# Patient Record
Sex: Male | Born: 1940 | Race: White | Hispanic: No | Marital: Single | Smoking: Former smoker
Health system: Southern US, Community
[De-identification: ages and names within clinical notes are randomized; demographics above are authoritative.]

## PROBLEM LIST (undated history)

## (undated) DIAGNOSIS — M1990 Unspecified osteoarthritis, unspecified site: Secondary | ICD-10-CM

## (undated) DIAGNOSIS — E785 Hyperlipidemia, unspecified: Secondary | ICD-10-CM

## (undated) HISTORY — PX: COLONOSCOPY: SHX174

## (undated) HISTORY — DX: Hyperlipidemia, unspecified: E78.5

## (undated) HISTORY — DX: Unspecified osteoarthritis, unspecified site: M19.90

---

## 2008-12-25 ENCOUNTER — Ambulatory Visit: Payer: Self-pay | Admitting: Gastroenterology

## 2016-06-08 ENCOUNTER — Ambulatory Visit
Admission: RE | Admit: 2016-06-08 | Discharge: 2016-06-08 | Disposition: A | Discharge status: Home / Self Care | Payer: BLUE CROSS/BLUE SHIELD | Source: Ambulatory Visit | Attending: Family Medicine | Admitting: Family Medicine

## 2016-06-08 ENCOUNTER — Other Ambulatory Visit: Payer: Self-pay | Admitting: Family Medicine

## 2016-06-08 DIAGNOSIS — R600 Localized edema: Secondary | ICD-10-CM | POA: Insufficient documentation

## 2017-03-17 ENCOUNTER — Ambulatory Visit (INDEPENDENT_AMBULATORY_CARE_PROVIDER_SITE_OTHER): Payer: BLUE CROSS/BLUE SHIELD | Admitting: Vascular Surgery

## 2017-03-17 ENCOUNTER — Encounter (INDEPENDENT_AMBULATORY_CARE_PROVIDER_SITE_OTHER): Payer: Self-pay | Admitting: Vascular Surgery

## 2017-03-17 VITALS — BP 122/72 | HR 76 | Resp 16 | Ht 64.0 in | Wt 165.4 lb

## 2017-03-17 DIAGNOSIS — M79604 Pain in right leg: Secondary | ICD-10-CM | POA: Insufficient documentation

## 2017-03-17 DIAGNOSIS — E785 Hyperlipidemia, unspecified: Secondary | ICD-10-CM | POA: Diagnosis not present

## 2017-03-17 DIAGNOSIS — M79605 Pain in left leg: Secondary | ICD-10-CM | POA: Diagnosis not present

## 2017-03-17 DIAGNOSIS — R600 Localized edema: Secondary | ICD-10-CM | POA: Diagnosis not present

## 2017-03-17 NOTE — Progress Notes (Signed)
Subjective:    Patient ID: Derek Lynch, male    DOB: 12-20-1940, 77 y.o.   MRN: [id number] Chief Complaint  Patient presents with  . New Patient (Initial Visit)    ref Rosalie Gums chronic ven insuffiency   Presents as a new patient referred by nurse practitioner Rosalie Gums for evaluation of lower extremity edema.  The patient endorses a 46-month history of edema to the lower legs.  The patient notes that they have since improved after his most recent visit with his primary care physician.  The patient notes that the Lasix helps.  The patient does engage in conservative therapy including medical grade 1 compression stockings and elevation of his lower extremity.  The patient has not worked in over a year due to a "foot infection" being treated by Dr. Alberteen Spindle.  The patient notes improvement to his left foot infection.  Patient is currently wearing bilateral diabetic shoes.  The patient notes his swelling is worse with activity or sitting or standing for long periods of time.  The patient does note discomfort to the bilateral feet with ambulation.  The patient denies any rest pain or ulceration to the bilateral lower extremity the patient denies any recent trauma surgery to the bilateral lower extremity the patient denies any DVT history to the bilateral lower extremity.  The patient denies any fever, nausea vomiting.   Review of Systems  Constitutional: Negative.   HENT: Negative.   Eyes: Negative.   Respiratory: Negative.   Cardiovascular: Positive for leg swelling.  Gastrointestinal: Negative.   Endocrine: Negative.   Genitourinary: Negative.   Musculoskeletal: Negative.   Skin: Negative.   Allergic/Immunologic: Negative.   Neurological: Negative.   Hematological: Negative.   Psychiatric/Behavioral: Negative.       Objective:   Physical Exam  Constitutional: He is oriented to person, place, and time. He appears well-developed and well-nourished. No distress.  HENT:  Head: Normocephalic and  atraumatic.  Eyes: Conjunctivae are normal.  Neck: Normal range of motion.  Cardiovascular: Normal rate, regular rhythm, normal heart sounds and intact distal pulses.  Pulses:      Radial pulses are 2+ on the right side, and 2+ on the left side.       Dorsalis pedis pulses are 1+ on the right side, and 1+ on the left side.       Posterior tibial pulses are 1+ on the right side, and 1+ on the left side.  Pulmonary/Chest: Effort normal and breath sounds normal.  Musculoskeletal: Normal range of motion. He exhibits edema (L to moderate nonpitting edema noted bilaterally).  Neurological: He is alert and oriented to person, place, and time.  Skin: He is not diaphoretic.  Minimal less than 1 cm varicosities noted to the bilateral lower extremity.  Moderate stasis dermatitis noted to the bilateral lower extremity.  There are no signs of active cellulitis or skin thickening noted to the bilateral lower extremity.  Psychiatric: He has a normal mood and affect. His behavior is normal. Judgment and thought content normal.  Vitals reviewed.  BP 122/72 (BP Location: Right Arm)   Pulse 76   Resp 16   Ht 5\' 4"  (1.626 m)   Wt 165 lb 6.4 oz (75 kg)   BMI 28.39 kg/m   Past Medical History:  Diagnosis Date  . Arthritis   . Hyperlipidemia    Social History   Socioeconomic History  . Marital status: Single    Spouse name: Not on file  . Number of children:  Not on file  . Years of education: Not on file  . Highest education level: Not on file  Social Needs  . Financial resource strain: Not on file  . Food insecurity - worry: Not on file  . Food insecurity - inability: Not on file  . Transportation needs - medical: Not on file  . Transportation needs - non-medical: Not on file  Occupational History  . Not on file  Tobacco Use  . Smoking status: Former Games developer  . Smokeless tobacco: Never Used  Substance and Sexual Activity  . Alcohol use: No    Frequency: Never  . Drug use: No  . Sexual  activity: Not on file  Other Topics Concern  . Not on file  Social History Narrative  . Not on file   Past Surgical History:  Procedure Laterality Date  . COLONOSCOPY     Family History  Problem Relation Age of Onset  . Heart attack Mother   . Stroke Father   . Pancreatic cancer Father    Allergies  Allergen Reactions  . Penicillin G Other (See Comments)      Assessment & Plan:  Presents as a new patient referred by nurse practitioner Rosalie Gums for evaluation of lower extremity edema.  The patient endorses a 43-month history of edema to the lower legs.  The patient notes that they have since improved after his most recent visit with his primary care physician.  The patient notes that the Lasix helps.  The patient does engage in conservative therapy including medical grade 1 compression stockings and elevation of his lower extremity.  The patient has not worked in over a year due to a "foot infection" being treated by Dr. Alberteen Spindle.  The patient notes improvement to his left foot infection.  Patient is currently wearing bilateral diabetic shoes.  The patient notes his swelling is worse with activity or sitting or standing for long periods of time.  The patient does note discomfort to the bilateral feet with ambulation.  The patient denies any rest pain or ulceration to the bilateral lower extremity the patient denies any recent trauma surgery to the bilateral lower extremity the patient denies any DVT history to the bilateral lower extremity.  The patient denies any fever, nausea vomiting.  1. Bilateral lower extremity edema - New The patient was encouraged to continue wearing graduated compression stockings (20-30 mmHg) on a daily basis. The patient was instructed to begin wearing the stockings first thing in the morning and removing them in the evening. The patient was instructed specifically not to sleep in the stockings. Prescription In addition, behavioral modification including elevation during  the day will be initiated. Anti-inflammatories for pain. The patient will follow up in one month to asses conservative management.  Information on chronic venous insufficiency and compression stockings was given to the patient. The patient was instructed to call the office in the interim if any worsening edema or ulcerations to the legs, feet or toes occurs. The patient expresses their understanding.  - VAS [place] LOWER EXTREMITY VENOUS REFLUX; Future  2. Pain in both lower extremities - New Patient with risk factors for peripheral artery disease Patient with a healing infection to the left foot Pain to the feet with ambulation Will order an ABI to rule out any contributing peripheral artery disease  - VAS [place] ABI WITH/WO TBI; Future  3. Hyperlipidemia, unspecified hyperlipidemia type - Stable Encouraged good control as its slows the progression of atherosclerotic disease  Current Outpatient Medications on  File Prior to Visit  Medication Sig Dispense Refill  . Ascorbic Acid (VITAMIN C) 1000 MG tablet Take 1,000 mg by mouth daily.    Marland Kitchen. aspirin EC 81 MG tablet Take by mouth.    . cholecalciferol (VITAMIN D) 1000 units tablet Take 1,000 Units by mouth daily.    . furosemide (LASIX) 20 MG tablet Take by mouth.    . lovastatin (MEVACOR) 20 MG tablet Take by mouth.     No current facility-administered medications on file prior to visit.    There are no Patient Instructions on file for this visit. No Follow-up on file.  Diamantina Edinger A Cayden Rautio, PA-C

## 2017-04-14 ENCOUNTER — Ambulatory Visit (INDEPENDENT_AMBULATORY_CARE_PROVIDER_SITE_OTHER): Payer: BLUE CROSS/BLUE SHIELD | Admitting: Vascular Surgery

## 2017-04-14 ENCOUNTER — Encounter (INDEPENDENT_AMBULATORY_CARE_PROVIDER_SITE_OTHER): Payer: Self-pay | Admitting: Vascular Surgery

## 2017-04-14 ENCOUNTER — Ambulatory Visit (INDEPENDENT_AMBULATORY_CARE_PROVIDER_SITE_OTHER): Payer: BLUE CROSS/BLUE SHIELD

## 2017-04-14 VITALS — BP 131/72 | HR 73 | Resp 14 | Ht 64.0 in | Wt 175.0 lb

## 2017-04-14 DIAGNOSIS — M79604 Pain in right leg: Secondary | ICD-10-CM | POA: Diagnosis not present

## 2017-04-14 DIAGNOSIS — R600 Localized edema: Secondary | ICD-10-CM

## 2017-04-14 DIAGNOSIS — I872 Venous insufficiency (chronic) (peripheral): Secondary | ICD-10-CM

## 2017-04-14 DIAGNOSIS — M79605 Pain in left leg: Secondary | ICD-10-CM | POA: Diagnosis not present

## 2017-04-14 DIAGNOSIS — I890 Lymphedema, not elsewhere classified: Secondary | ICD-10-CM | POA: Diagnosis not present

## 2017-04-14 NOTE — Progress Notes (Signed)
Subjective:    Patient ID: Derek Lynch, male    DOB: April 02, 1940, 77 y.o.   MRN: [id number] Chief Complaint  Patient presents with  . Follow-up    16month abi,bil le ven reflux   Patient presents to review vascular studies.  The patient was last seen on March 17, 2017 for lower extremity edema and discomfort.  Since his initial visit, the patient has been engaging in conservative therapy including wearing medical grade 1 compression socks and elevating his legs with some improvement to his symptoms.  The patient continues to wear orthotic shoes.  The patient does note an improvement to his edema.  The patient underwent a bilateral lower extremity ABI which was notable for bilateral triphasic tibials left ABI 1.16 right ABI noncompressible.  No evidence of significant lower extremity arterial disease bilaterally.  The patient underwent a bilateral venous duplex which was notable for abnormal reflux times in the right common femoral vein, femoral vein, popliteal vein and great saphenous vein at the groin.  Abnormal reflux times were noted in the left femoral vein.  There is no evidence of deep vein or superficial thrombophlebitis to the bilateral lower extremity.  The patient denies any worsening symptoms.  The patient denies any fever, nausea vomiting.  Review of Systems  Constitutional: Negative.   HENT: Negative.   Eyes: Negative.   Respiratory: Negative.   Cardiovascular: Positive for leg swelling.  Gastrointestinal: Negative.   Endocrine: Negative.   Genitourinary: Negative.   Musculoskeletal: Negative.   Skin: Negative.   Allergic/Immunologic: Negative.   Neurological: Negative.   Hematological: Negative.   Psychiatric/Behavioral: Negative.       Objective:   Physical Exam  Constitutional: He is oriented to person, place, and time. He appears well-developed and well-nourished. No distress.  HENT:  Head: Normocephalic and atraumatic.  Eyes: Pupils are equal, round, and  reactive to light. Conjunctivae are normal.  Neck: Normal range of motion.  Cardiovascular: Normal rate, regular rhythm, normal heart sounds and intact distal pulses.  Pulses:      Radial pulses are 2+ on the right side, and 2+ on the left side.       Dorsalis pedis pulses are 1+ on the right side, and 1+ on the left side.       Posterior tibial pulses are 1+ on the right side, and 1+ on the left side.  Pulmonary/Chest: Effort normal and breath sounds normal.  Musculoskeletal: Normal range of motion. He exhibits edema (Mild nonpitting edema noted bilaterally).  Neurological: He is alert and oriented to person, place, and time.  Skin: He is not diaphoretic.  Minimal less than 1 cm varicosities noted to the bilateral lower extremity.  Moderate stasis dermatitis noted to the bilateral lower extremity.  There are no signs of active cellulitis or skin thickening noted to the bilateral lower extremity  Psychiatric: He has a normal mood and affect. His behavior is normal. Judgment and thought content normal.  Vitals reviewed.  BP 131/72 (BP Location: Left Arm)   Pulse 73   Resp 14   Ht 5\' 4"  (1.626 m)   Wt 175 lb (79.4 kg)   BMI 30.04 kg/m   Past Medical History:  Diagnosis Date  . Arthritis   . Hyperlipidemia    Social History   Socioeconomic History  . Marital status: Single    Spouse name: Not on file  . Number of children: Not on file  . Years of education: Not on file  . Highest  education level: Not on file  Occupational History  . Not on file  Social Needs  . Financial resource strain: Not on file  . Food insecurity:    Worry: Not on file    Inability: Not on file  . Transportation needs:    Medical: Not on file    Non-medical: Not on file  Tobacco Use  . Smoking status: Former Games developermoker  . Smokeless tobacco: Never Used  Substance and Sexual Activity  . Alcohol use: No    Frequency: Never  . Drug use: No  . Sexual activity: Not on file  Lifestyle  . Physical  activity:    Days per week: Not on file    Minutes per session: Not on file  . Stress: Not on file  Relationships  . Social connections:    Talks on phone: Not on file    Gets together: Not on file    Attends religious service: Not on file    Active member of club or organization: Not on file    Attends meetings of clubs or organizations: Not on file    Relationship status: Not on file  . Intimate partner violence:    Fear of current or ex partner: Not on file    Emotionally abused: Not on file    Physically abused: Not on file    Forced sexual activity: Not on file  Other Topics Concern  . Not on file  Social History Narrative  . Not on file   Past Surgical History:  Procedure Laterality Date  . COLONOSCOPY     Family History  Problem Relation Age of Onset  . Heart attack Mother   . Stroke Father   . Pancreatic cancer Father    Allergies  Allergen Reactions  . Penicillin G Other (See Comments)      Assessment & Plan:  Patient presents to review vascular studies.  The patient was last seen on March 17, 2017 for lower extremity edema and discomfort.  Since his initial visit, the patient has been engaging in conservative therapy including wearing medical grade 1 compression socks and elevating his legs with some improvement to his symptoms.  The patient continues to wear orthotic shoes.  The patient does note an improvement to his edema.  The patient underwent a bilateral lower extremity ABI which was notable for bilateral triphasic tibials left ABI 1.16 right ABI noncompressible.  No evidence of significant lower extremity arterial disease bilaterally.  The patient underwent a bilateral venous duplex which was notable for abnormal reflux times in the right common femoral vein, femoral vein, popliteal vein and great saphenous vein at the groin.  Abnormal reflux times were noted in the left femoral vein.  There is no evidence of deep vein or superficial thrombophlebitis to the  bilateral lower extremity.  The patient denies any worsening symptoms.  The patient denies any fever, nausea vomiting.  1. Chronic venous insufficiency - New Patient with normal ABI today.  Patient with reflux to the deep venous system bilaterally.  Due to the anatomical location he is not a candidate for endovenous laser ablation We discussed the addition of a lymphedema pump.  At this time, the patient is not interested in moving forward with this additional therapy. The patient should continue engaging in conservative therapy including wearing medical grade 1 compression stockings and elevating his legs I will bring the patient back in 2 months to assess his progress. The patient can call the office at any time and  we will be happy to apply for a lymphedema pump  2. Lymphedema - Stable As above  Current Outpatient Medications on File Prior to Visit  Medication Sig Dispense Refill  . Ascorbic Acid (VITAMIN C) 1000 MG tablet Take 1,000 mg by mouth daily.    Marland Kitchen aspirin EC 81 MG tablet Take by mouth.    . cholecalciferol (VITAMIN D) 1000 units tablet Take 1,000 Units by mouth daily.    . furosemide (LASIX) 20 MG tablet Take by mouth.    . lovastatin (MEVACOR) 20 MG tablet Take by mouth.     No current facility-administered medications on file prior to visit.     There are no Patient Instructions on file for this visit. No follow-ups on file.   Deegan Valentino A Oluwatobi Visser, PA-C

## 2017-04-19 ENCOUNTER — Telehealth (INDEPENDENT_AMBULATORY_CARE_PROVIDER_SITE_OTHER): Payer: Self-pay

## 2017-04-19 NOTE — Telephone Encounter (Signed)
I spoke with Valentina GuLucy and she verbalized that Allysa needed patient last note from our office and I inform her that I had faxed that note on 04/15/17 and she confirmed that the note was received

## 2017-04-19 NOTE — Telephone Encounter (Signed)
Alissa called from [employer] wanting a copy of the office visit note from the patient's visit on 04/17/17 so that his disability can be extended.  Please call [phone] Stormy Fabianlissa would like to confirm some other information as well.

## 2017-07-15 ENCOUNTER — Ambulatory Visit (INDEPENDENT_AMBULATORY_CARE_PROVIDER_SITE_OTHER): Payer: Self-pay | Admitting: Vascular Surgery

## 2017-07-21 ENCOUNTER — Encounter (INDEPENDENT_AMBULATORY_CARE_PROVIDER_SITE_OTHER): Payer: Self-pay | Admitting: Vascular Surgery

## 2017-07-21 ENCOUNTER — Ambulatory Visit (INDEPENDENT_AMBULATORY_CARE_PROVIDER_SITE_OTHER): Payer: Medicare Other | Admitting: Vascular Surgery

## 2017-07-21 VITALS — BP 128/71 | HR 71 | Resp 13 | Ht 64.0 in | Wt 184.0 lb

## 2017-07-21 DIAGNOSIS — I872 Venous insufficiency (chronic) (peripheral): Secondary | ICD-10-CM

## 2017-07-21 DIAGNOSIS — I890 Lymphedema, not elsewhere classified: Secondary | ICD-10-CM | POA: Diagnosis not present

## 2017-07-21 NOTE — Progress Notes (Signed)
Subjective:    Patient ID: Derek Lynch, male    DOB: Feb 20, 1940, 77 y.o.   MRN: [id number] Chief Complaint  Patient presents with  . Follow-up    3 month lymphedema check   Patient presents for a 2244-month bilateral lower extremity chronic venous insufficiency/lymphedema follow-up.  The patient notes that he still experiences some mild swelling to the bilateral lower extremity however he has not been engaging conservative therapy including wearing medical grade 1 compression socks or elevating his legs on a daily basis.  The patient notes that the edema is associated with mild discomfort and this is not lifestyle limiting at this time.  The patient denies any claudication-like symptoms, rest pain or ulceration to the bilateral lower extremity.  Denies any fever, nausea vomiting.  Review of Systems  Constitutional: Negative.   HENT: Negative.   Eyes: Negative.   Respiratory: Negative.   Cardiovascular: Positive for leg swelling.       Chronic venous insufficiency Lymphedema  Gastrointestinal: Negative.   Endocrine: Negative.   Genitourinary: Negative.   Musculoskeletal: Negative.   Skin: Negative.   Allergic/Immunologic: Negative.   Neurological: Negative.   Hematological: Negative.   Psychiatric/Behavioral: Negative.       Objective:   Physical Exam  Constitutional: He is oriented to person, place, and time. He appears well-developed and well-nourished. No distress.  HENT:  Head: Normocephalic and atraumatic.  Right Ear: External ear normal.  Left Ear: External ear normal.  Eyes: Pupils are equal, round, and reactive to light. Conjunctivae and EOM are normal.  Neck: Normal range of motion.  Cardiovascular: Normal rate, regular rhythm and normal heart sounds.  Pulmonary/Chest: Effort normal and breath sounds normal.  Musculoskeletal: Normal range of motion. He exhibits edema (Mild nonpitting edema noted bilaterally).  Neurological: He is alert and oriented to person, place,  and time.  Skin: Skin is warm and dry. He is not diaphoretic.  No active cellulitis or ulcerations at this time  Psychiatric: He has a normal mood and affect. His behavior is normal. Judgment and thought content normal.  Vitals reviewed.  BP 128/71 (BP Location: Left Arm, Patient Position: Sitting)   Pulse 71   Resp 13   Ht 5\' 4"  (1.626 m)   Wt 184 lb (83.5 kg)   BMI 31.58 kg/m   Past Medical History:  Diagnosis Date  . Arthritis   . Hyperlipidemia    Social History   Socioeconomic History  . Marital status: Single    Spouse name: Not on file  . Number of children: Not on file  . Years of education: Not on file  . Highest education level: Not on file  Occupational History  . Not on file  Social Needs  . Financial resource strain: Not on file  . Food insecurity:    Worry: Not on file    Inability: Not on file  . Transportation needs:    Medical: Not on file    Non-medical: Not on file  Tobacco Use  . Smoking status: Former Games developermoker  . Smokeless tobacco: Never Used  Substance and Sexual Activity  . Alcohol use: No    Frequency: Never  . Drug use: No  . Sexual activity: Not on file  Lifestyle  . Physical activity:    Days per week: Not on file    Minutes per session: Not on file  . Stress: Not on file  Relationships  . Social connections:    Talks on phone: Not on file  Gets together: Not on file    Attends religious service: Not on file    Active member of club or organization: Not on file    Attends meetings of clubs or organizations: Not on file    Relationship status: Not on file  . Intimate partner violence:    Fear of current or ex partner: Not on file    Emotionally abused: Not on file    Physically abused: Not on file    Forced sexual activity: Not on file  Other Topics Concern  . Not on file  Social History Narrative  . Not on file   Past Surgical History:  Procedure Laterality Date  . COLONOSCOPY     Family History  Problem Relation Age  of Onset  . Heart attack Mother   . Stroke Father   . Pancreatic cancer Father    Allergies  Allergen Reactions  . Penicillin G Other (See Comments)      Assessment & Plan:  Patient presents for a 28-month bilateral lower extremity chronic venous insufficiency/lymphedema follow-up.  The patient notes that he still experiences some mild swelling to the bilateral lower extremity however he has not been engaging conservative therapy including wearing medical grade 1 compression socks or elevating his legs on a daily basis.  The patient notes that the edema is associated with mild discomfort and this is not lifestyle limiting at this time.  The patient denies any claudication-like symptoms, rest pain or ulceration to the bilateral lower extremity.  Denies any fever, nausea vomiting.  1. Chronic venous insufficiency - Stable Studies reviewed with the patient.  The patient has venous insufficiency noted to the deep venous system and therefore it is not a candidate for laser ablation or sclerotherapy. The patient was encouraged to wear graduated compression stockings (20-30 mmHg) on a daily basis. The patient was instructed to begin wearing the stockings first thing in the morning and removing them in the evening. The patient was instructed specifically not to sleep in the stockings. Prescription given. In addition, behavioral modification including elevation during the day will be initiated. We discussed a lymphedema pump if conventional therapy goes not work. The patient was advised to follow up in six months after wearing her compression stockings daily with elevation. Information on compression stockings, lymphedema and the lymphedema pump was given to the patient. The patient was instructed to call the office in the interim if any worsening edema or ulcerations to the legs, feet or toes occurs. The patient expresses their understanding.  2. Lymphedema - Stable As above  Current Outpatient  Medications on File Prior to Visit  Medication Sig Dispense Refill  . Ascorbic Acid (VITAMIN C) 1000 MG tablet Take 1,000 mg by mouth daily.    Marland Kitchen aspirin EC 81 MG tablet Take by mouth.    . cholecalciferol (VITAMIN D) 1000 units tablet Take 1,000 Units by mouth daily.    . furosemide (LASIX) 20 MG tablet Take by mouth.    . lovastatin (MEVACOR) 20 MG tablet Take by mouth.    . triamcinolone cream (KENALOG) 0.5 % APPLY TOPICALLY ONCE A DAY  1   No current facility-administered medications on file prior to visit.    There are no Patient Instructions on file for this visit. No follow-ups on file.  Madix Blowe A Naethan Bracewell, PA-C

## 2018-01-24 ENCOUNTER — Encounter (INDEPENDENT_AMBULATORY_CARE_PROVIDER_SITE_OTHER): Payer: Self-pay | Admitting: Vascular Surgery

## 2018-01-24 ENCOUNTER — Ambulatory Visit (INDEPENDENT_AMBULATORY_CARE_PROVIDER_SITE_OTHER): Payer: Medicare Other | Admitting: Vascular Surgery

## 2018-01-24 VITALS — BP 134/85 | HR 83 | Resp 16 | Ht 64.0 in | Wt 174.6 lb

## 2018-01-24 DIAGNOSIS — I872 Venous insufficiency (chronic) (peripheral): Secondary | ICD-10-CM

## 2018-01-24 DIAGNOSIS — I890 Lymphedema, not elsewhere classified: Secondary | ICD-10-CM | POA: Diagnosis not present

## 2018-01-24 NOTE — Progress Notes (Signed)
Subjective:    Patient ID: Derek Lynch, male    DOB: 07/12/1940, 78 y.o.   MRN: [id number] Chief Complaint  Patient presents with  . Follow-up   Patient presents for a 50-month chronic venous insufficiency and lymphedema follow-up.  The patient presents today without complaint.  Over the last 6 months, the patient has started engaging in conservative therapy including wearing medical grade 1 compression socks and elevating his legs on a daily basis.  The patient notes an improvement in his lower extremity edema and discomfort.  The patient denies any claudication-like symptoms, rest pain, recent/recurrent bouts of cellulitis or ulcer formation to the bilateral legs.  We had a discussion about possibly moving forward with lymphedema pump however at this time the patient is not interested.  We reviewed conservative therapy, how to wear compression socks, appropriate elevation encouraged and improvement in activity.  Patient expresses understanding.  Patient denies any fever, nausea vomiting.  Review of Systems  Constitutional: Negative.   HENT: Negative.   Eyes: Negative.   Respiratory: Negative.   Cardiovascular: Positive for leg swelling.  Gastrointestinal: Negative.   Endocrine: Negative.   Genitourinary: Negative.   Musculoskeletal: Negative.   Skin: Negative.   Allergic/Immunologic: Negative.   Neurological: Negative.   Hematological: Negative.   Psychiatric/Behavioral: Negative.       Objective:   Physical Exam Vitals signs reviewed.  Constitutional:      Appearance: Normal appearance.  HENT:     Head: Normocephalic and atraumatic.     Right Ear: External ear normal.     Left Ear: External ear normal.     Nose: Nose normal.     Mouth/Throat:     Mouth: Mucous membranes are moist.     Pharynx: Oropharynx is clear.  Eyes:     Extraocular Movements: Extraocular movements intact.     Conjunctiva/sclera: Conjunctivae normal.     Pupils: Pupils are equal, round, and  reactive to light.  Neck:     Musculoskeletal: Normal range of motion.  Cardiovascular:     Rate and Rhythm: Normal rate and regular rhythm.  Pulmonary:     Effort: Pulmonary effort is normal.     Breath sounds: Normal breath sounds.  Musculoskeletal:        General: Swelling (Mild nonpitting edema noted bilaterally) present.  Skin:    General: Skin is warm and dry.     Coloration: Skin is not pale.     Findings: No erythema.  Neurological:     General: No focal deficit present.     Mental Status: He is alert and oriented to person, place, and time. Mental status is at baseline.  Psychiatric:        Mood and Affect: Mood normal.        Behavior: Behavior normal.        Thought Content: Thought content normal.        Judgment: Judgment normal.    BP 134/85 (BP Location: Right Arm, Patient Position: Sitting)   Pulse 83   Resp 16   Ht 5\' 4"  (1.626 m)   Wt 174 lb 9.6 oz (79.2 kg)   BMI 29.97 kg/m   Past Medical History:  Diagnosis Date  . Arthritis   . Hyperlipidemia    Social History   Socioeconomic History  . Marital status: Single    Spouse name: Not on file  . Number of children: Not on file  . Years of education: Not on file  . Highest  education level: Not on file  Occupational History  . Not on file  Social Needs  . Financial resource strain: Not on file  . Food insecurity:    Worry: Not on file    Inability: Not on file  . Transportation needs:    Medical: Not on file    Non-medical: Not on file  Tobacco Use  . Smoking status: Former Games developer  . Smokeless tobacco: Never Used  Substance and Sexual Activity  . Alcohol use: No    Frequency: Never  . Drug use: No  . Sexual activity: Not on file  Lifestyle  . Physical activity:    Days per week: Not on file    Minutes per session: Not on file  . Stress: Not on file  Relationships  . Social connections:    Talks on phone: Not on file    Gets together: Not on file    Attends religious service: Not on  file    Active member of club or organization: Not on file    Attends meetings of clubs or organizations: Not on file    Relationship status: Not on file  . Intimate partner violence:    Fear of current or ex partner: Not on file    Emotionally abused: Not on file    Physically abused: Not on file    Forced sexual activity: Not on file  Other Topics Concern  . Not on file  Social History Narrative  . Not on file   Past Surgical History:  Procedure Laterality Date  . COLONOSCOPY     Family History  Problem Relation Age of Onset  . Heart attack Mother   . Stroke Father   . Pancreatic cancer Father    Allergies  Allergen Reactions  . Penicillin G Other (See Comments)      Assessment & Plan:  Patient presents for a 70-month chronic venous insufficiency and lymphedema follow-up.  The patient presents today without complaint.  Over the last 6 months, the patient has started engaging in conservative therapy including wearing medical grade 1 compression socks and elevating his legs on a daily basis.  The patient notes an improvement in his lower extremity edema and discomfort.  The patient denies any claudication-like symptoms, rest pain, recent/recurrent bouts of cellulitis or ulcer formation to the bilateral legs.  We had a discussion about possibly moving forward with lymphedema pump however at this time the patient is not interested.  We reviewed conservative therapy, how to wear compression socks, appropriate elevation encouraged and improvement in activity.  Patient expresses understanding.  Patient denies any fever, nausea vomiting.  1. Lymphedema - Stable The patient was noted to have chronic venous insufficiency however it is located in the deep venous system so he is not a candidate for endovenous laser ablation. Over the last 6 months, the patient has been engaging in conservative therapy including wearing medical grade 1 compression socks and elevating his legs on a daily  basis The patient states that as per his physical exam there has been improvement in his edema to the lower extremity. We had discussion about possibly moving forward with the addition of a lymphedema pump.  At this time, the patient is not interested in pursuing this additional therapy I will see the patient back in approximately 1 year for a follow-up The patient is to follow-up sooner if he notes any worsening in edema He expresses understanding  2. Chronic venous insufficiency - Stable As Above  Current Outpatient  Medications on File Prior to Visit  Medication Sig Dispense Refill  . Ascorbic Acid (VITAMIN C) 1000 MG tablet Take 1,000 mg by mouth daily.    Marland Kitchen. aspirin EC 81 MG tablet Take by mouth.    . cholecalciferol (VITAMIN D) 1000 units tablet Take 1,000 Units by mouth daily.    . furosemide (LASIX) 20 MG tablet Take by mouth.    . lovastatin (MEVACOR) 20 MG tablet Take by mouth.    . metFORMIN (GLUCOPHAGE) 500 MG tablet Take by mouth daily with breakfast.    . triamcinolone cream (KENALOG) 0.5 % APPLY TOPICALLY ONCE A DAY  1   No current facility-administered medications on file prior to visit.    There are no Patient Instructions on file for this visit. No follow-ups on file.  Kateena Degroote A Garyn Arlotta, PA-C

## 2018-04-15 IMAGING — US US EXTREM LOW VENOUS BILAT
1 series · 13 of 24 positions shown · non-contrast
Comparison: No prior.

CLINICAL DATA: Right calf swelling.



[Series 1: us extrem low venous bilat · 13 of 68 slices shown]
[im 1/68]
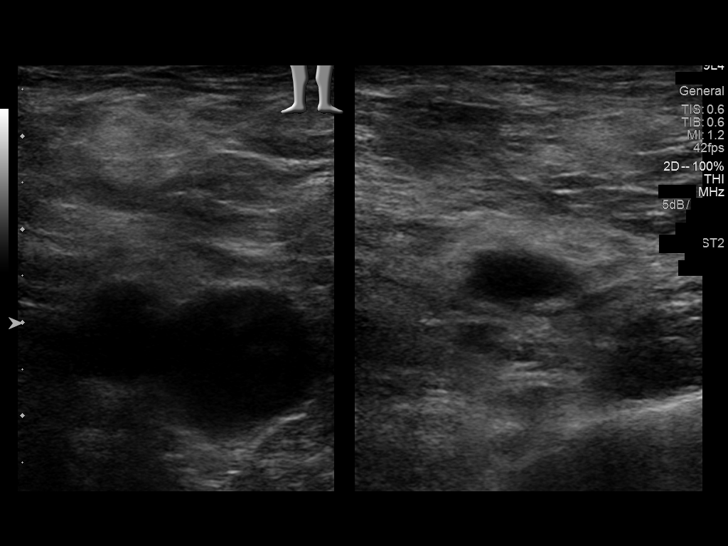
[im 6/68]
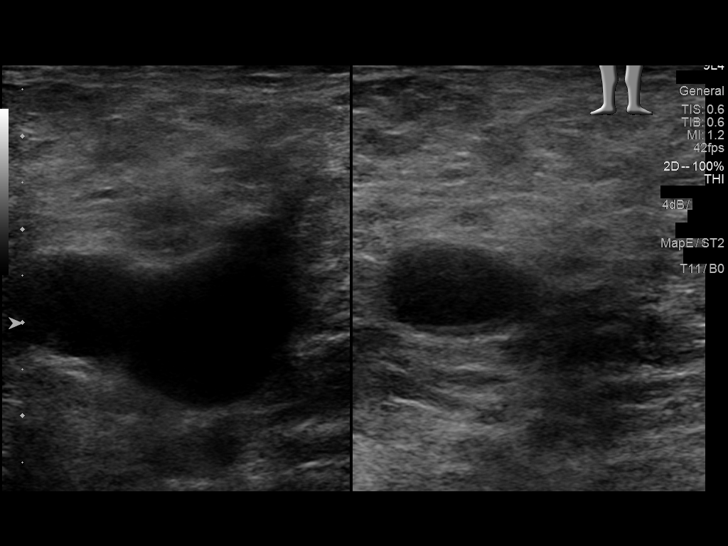
[im 12/68]
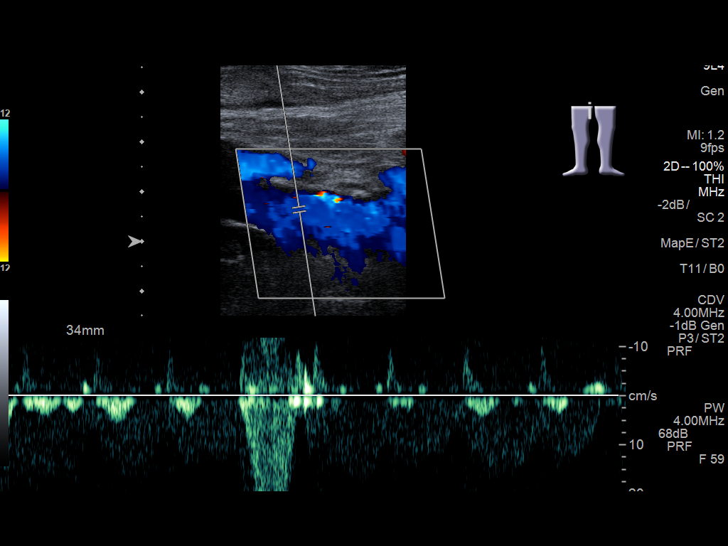
[im 18/68]
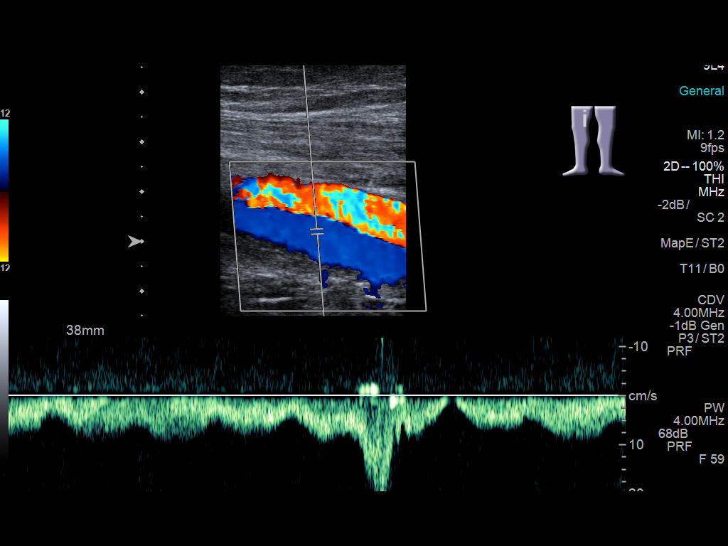
[im 24/68]
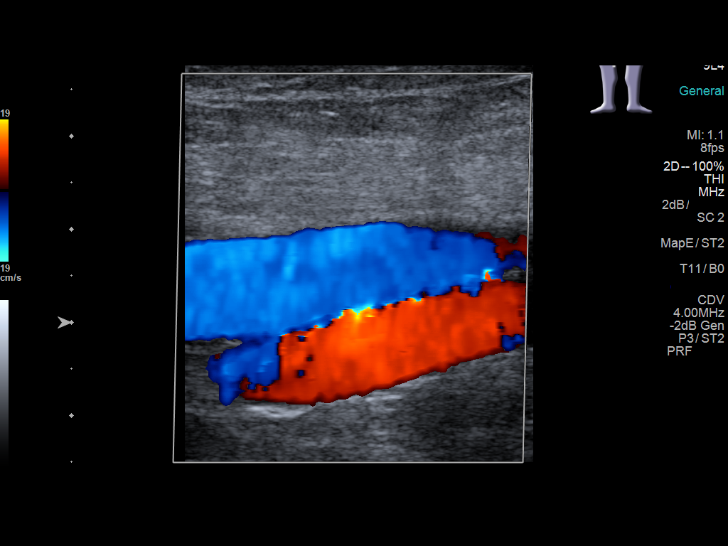
[im 30/68]
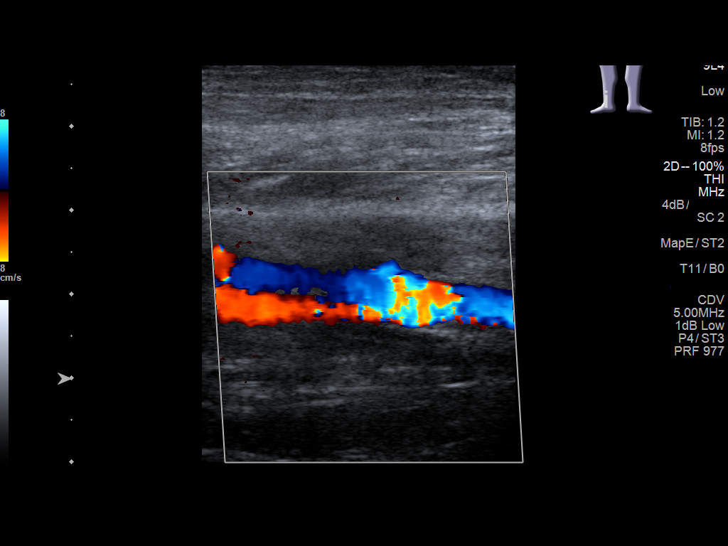
[im 35/68]
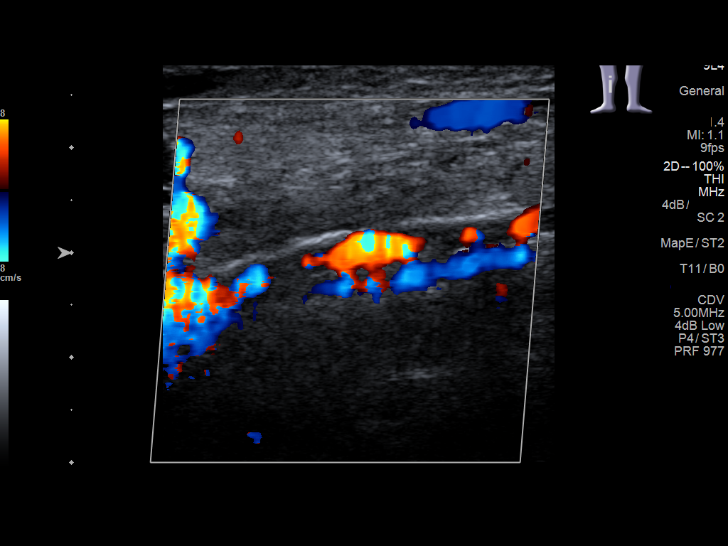
[im 38/68]
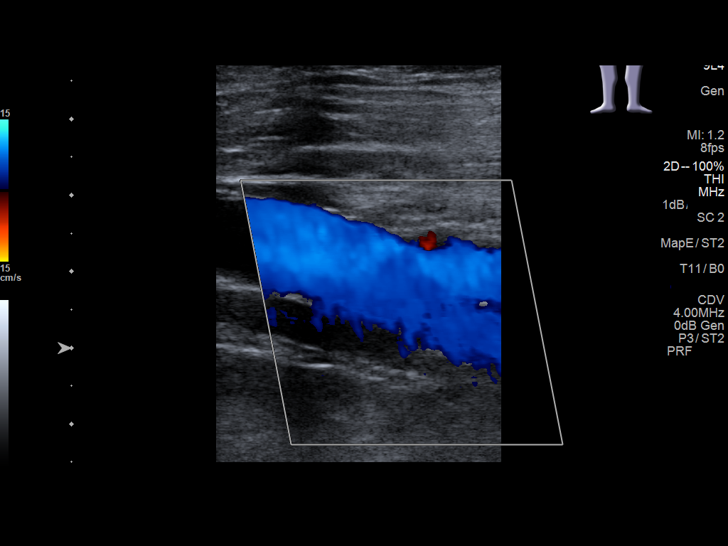
[im 44/68]
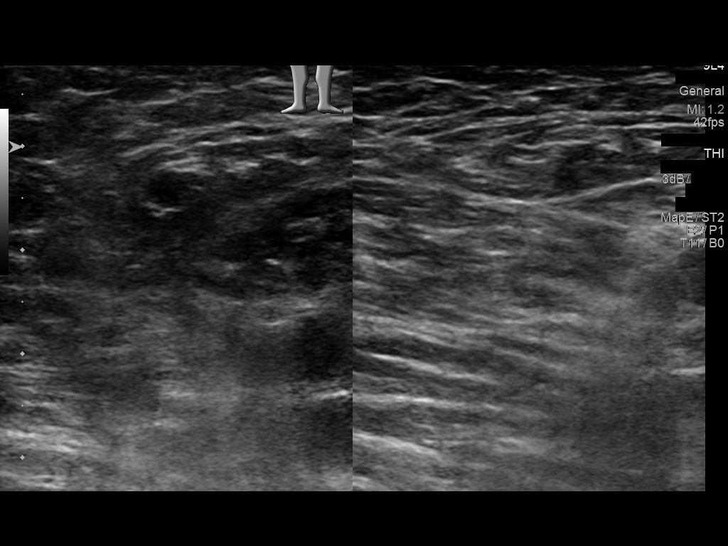
[im 50/68]
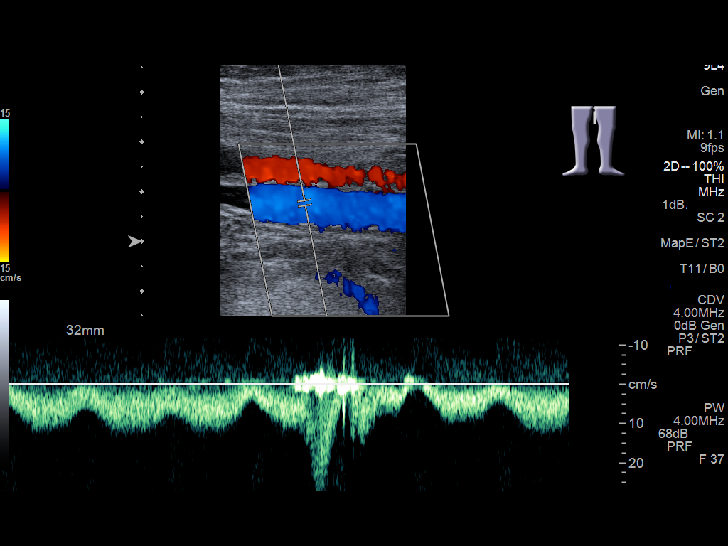
[im 56/68]
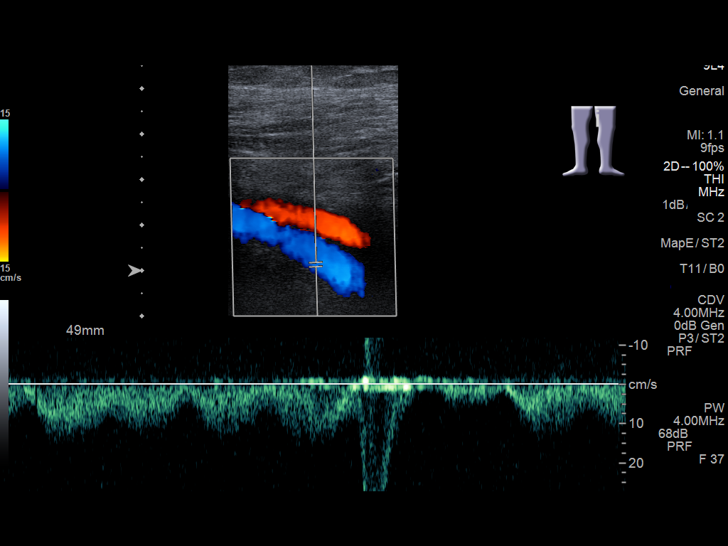
[im 62/68]
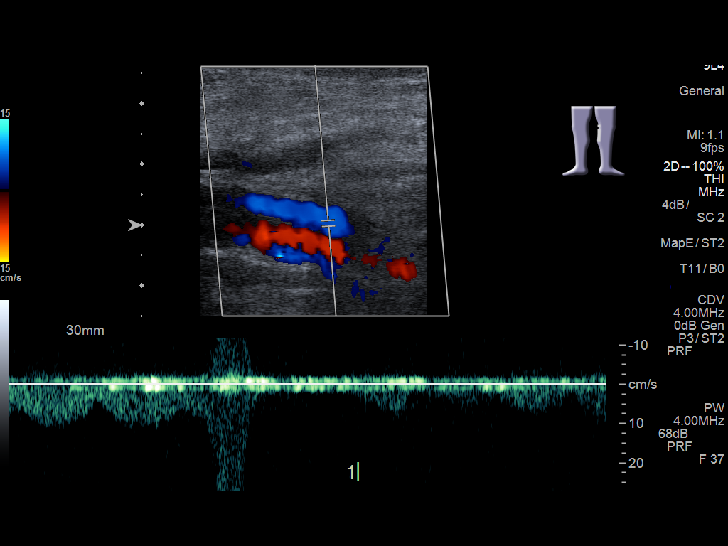
[im 68/68]
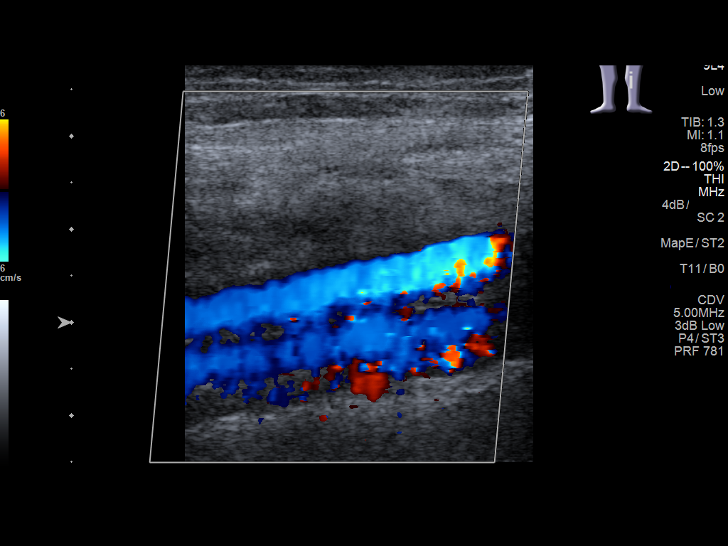

[13 of 24 positions shown; findings below may reference images not displayed]

FINDINGS: RIGHT LOWER EXTREMITY

Common Femoral Vein: No evidence of thrombus. Normal
compressibility, respiratory phasicity and response to augmentation.

Saphenofemoral Junction: No evidence of thrombus. Normal
compressibility and flow on color Doppler imaging.

Profunda Femoral Vein: No evidence of thrombus. Normal
compressibility and flow on color Doppler imaging.

Femoral Vein: No evidence of thrombus. Normal compressibility,
respiratory phasicity and response to augmentation.

Popliteal Vein: No evidence of thrombus. Normal compressibility,
respiratory phasicity and response to augmentation.

Calf Veins: No evidence of thrombus. Normal compressibility and flow
on color Doppler imaging.

Superficial Great Saphenous Vein: No evidence of thrombus. Normal
compressibility and flow on color Doppler imaging.

Other Findings:  None.

LEFT LOWER EXTREMITY

Common Femoral Vein: No evidence of thrombus. Normal
compressibility, respiratory phasicity and response to augmentation.

Saphenofemoral Junction: No evidence of thrombus. Normal
compressibility and flow on color Doppler imaging.

Profunda Femoral Vein: No evidence of thrombus. Normal
compressibility and flow on color Doppler imaging.

Femoral Vein: No evidence of thrombus. Normal compressibility,
respiratory phasicity and response to augmentation.

Popliteal Vein: No evidence of thrombus. Normal compressibility,
respiratory phasicity and response to augmentation.

Calf Veins: No evidence of thrombus. Normal compressibility and flow
on color Doppler imaging.

Superficial Great Saphenous Vein: No evidence of thrombus. Normal
compressibility and flow on color Doppler imaging.

Other Findings:  None.
IMPRESSION: No evidence of DVT within either lower extremity.

## 2019-01-10 ENCOUNTER — Ambulatory Visit: Payer: Self-pay

## 2019-01-23 ENCOUNTER — Ambulatory Visit (INDEPENDENT_AMBULATORY_CARE_PROVIDER_SITE_OTHER): Payer: Medicare Other | Admitting: Nurse Practitioner

## 2019-01-25 ENCOUNTER — Ambulatory Visit (INDEPENDENT_AMBULATORY_CARE_PROVIDER_SITE_OTHER): Payer: Medicare Other | Admitting: Nurse Practitioner

## 2021-11-20 ENCOUNTER — Inpatient Hospital Stay
Admission: EM | Admit: 2021-11-20 | Discharge: 2021-11-25 | DRG: 872 | Disposition: A | Discharge status: Skilled Nursing Facility | Payer: Medicare Other | Attending: Internal Medicine | Admitting: Internal Medicine

## 2021-11-20 ENCOUNTER — Emergency Department: Payer: Medicare Other

## 2021-11-20 ENCOUNTER — Other Ambulatory Visit: Payer: Self-pay

## 2021-11-20 DIAGNOSIS — R5381 Other malaise: Secondary | ICD-10-CM | POA: Diagnosis present

## 2021-11-20 DIAGNOSIS — M6282 Rhabdomyolysis: Secondary | ICD-10-CM | POA: Diagnosis present

## 2021-11-20 DIAGNOSIS — T796XXA Traumatic ischemia of muscle, initial encounter: Secondary | ICD-10-CM | POA: Diagnosis present

## 2021-11-20 DIAGNOSIS — Z880 Allergy status to penicillin: Secondary | ICD-10-CM | POA: Diagnosis not present

## 2021-11-20 DIAGNOSIS — Z7984 Long term (current) use of oral hypoglycemic drugs: Secondary | ICD-10-CM | POA: Diagnosis not present

## 2021-11-20 DIAGNOSIS — R918 Other nonspecific abnormal finding of lung field: Secondary | ICD-10-CM

## 2021-11-20 DIAGNOSIS — Z66 Do not resuscitate: Secondary | ICD-10-CM | POA: Diagnosis present

## 2021-11-20 DIAGNOSIS — E785 Hyperlipidemia, unspecified: Secondary | ICD-10-CM | POA: Diagnosis present

## 2021-11-20 DIAGNOSIS — W010XXA Fall on same level from slipping, tripping and stumbling without subsequent striking against object, initial encounter: Secondary | ICD-10-CM | POA: Diagnosis present

## 2021-11-20 DIAGNOSIS — Z7982 Long term (current) use of aspirin: Secondary | ICD-10-CM

## 2021-11-20 DIAGNOSIS — W19XXXA Unspecified fall, initial encounter: Secondary | ICD-10-CM | POA: Diagnosis not present

## 2021-11-20 DIAGNOSIS — R7401 Elevation of levels of liver transaminase levels: Secondary | ICD-10-CM | POA: Diagnosis present

## 2021-11-20 DIAGNOSIS — A419 Sepsis, unspecified organism: Principal | ICD-10-CM | POA: Diagnosis present

## 2021-11-20 DIAGNOSIS — L03116 Cellulitis of left lower limb: Secondary | ICD-10-CM | POA: Diagnosis present

## 2021-11-20 DIAGNOSIS — E119 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: Secondary | ICD-10-CM | POA: Diagnosis present

## 2021-11-20 DIAGNOSIS — L0390 Cellulitis, unspecified: Secondary | ICD-10-CM

## 2021-11-20 DIAGNOSIS — Z87891 Personal history of nicotine dependence: Secondary | ICD-10-CM

## 2021-11-20 DIAGNOSIS — Z8249 Family history of ischemic heart disease and other diseases of the circulatory system: Secondary | ICD-10-CM | POA: Diagnosis not present

## 2021-11-20 DIAGNOSIS — Z23 Encounter for immunization: Secondary | ICD-10-CM | POA: Diagnosis present

## 2021-11-20 DIAGNOSIS — Z6830 Body mass index (BMI) 30.0-30.9, adult: Secondary | ICD-10-CM | POA: Diagnosis not present

## 2021-11-20 DIAGNOSIS — Z79899 Other long term (current) drug therapy: Secondary | ICD-10-CM

## 2021-11-20 DIAGNOSIS — J9811 Atelectasis: Secondary | ICD-10-CM | POA: Diagnosis present

## 2021-11-20 DIAGNOSIS — S50311A Abrasion of right elbow, initial encounter: Secondary | ICD-10-CM | POA: Diagnosis present

## 2021-11-20 DIAGNOSIS — N179 Acute kidney failure, unspecified: Secondary | ICD-10-CM | POA: Diagnosis present

## 2021-11-20 DIAGNOSIS — E669 Obesity, unspecified: Secondary | ICD-10-CM | POA: Diagnosis present

## 2021-11-20 DIAGNOSIS — M1990 Unspecified osteoarthritis, unspecified site: Secondary | ICD-10-CM | POA: Diagnosis present

## 2021-11-20 LAB — BASIC METABOLIC PANEL
Anion gap: 14 (ref 5–15)
BUN: 20 mg/dL (ref 8–23)
CO2: 22 mmol/L (ref 22–32)
Calcium: 9.2 mg/dL (ref 8.9–10.3)
Chloride: 101 mmol/L (ref 98–111)
Creatinine, Ser: 1.43 mg/dL — ABNORMAL HIGH (ref 0.61–1.24)
GFR, Estimated: 50 mL/min — ABNORMAL LOW (ref >60)
Glucose, Bld: 154 mg/dL — ABNORMAL HIGH (ref 70–99)
Potassium: 4 mmol/L (ref 3.5–5.1)
Sodium: 137 mmol/L (ref 135–145)

## 2021-11-20 LAB — CBC
HCT: 40 % (ref 39.0–52.0)
Hemoglobin: 13.7 g/dL (ref 13.0–17.0)
MCH: 31.4 pg (ref 26.0–34.0)
MCHC: 34.3 g/dL (ref 30.0–36.0)
MCV: 91.5 fL (ref 80.0–100.0)
Platelets: 223 10*3/uL (ref 150–400)
RBC: 4.37 MIL/uL (ref 4.22–5.81)
RDW: 13.2 % (ref 11.5–15.5)
WBC: 12.9 10*3/uL — ABNORMAL HIGH (ref 4.0–10.5)
nRBC: 0 % (ref 0.0–0.2)

## 2021-11-20 LAB — CK: Total CK: 10147 U/L — ABNORMAL HIGH (ref 49–397)

## 2021-11-20 MED ORDER — ONDANSETRON HCL 4 MG PO TABS: 4.0000 mg | ORAL_TABLET | Freq: Four times a day (QID) | ORAL | Status: DC | PRN

## 2021-11-20 MED ORDER — DOXYCYCLINE HYCLATE 100 MG PO TABS
100.0000 mg | ORAL_TABLET | Freq: Once | ORAL | Status: AC
  Administered 2021-11-20: 100 mg via ORAL
  Filled 2021-11-20: qty 1

## 2021-11-20 MED ORDER — MAGNESIUM HYDROXIDE 400 MG/5ML PO SUSP: 30.0000 mL | Freq: Every day | ORAL | Status: DC | PRN

## 2021-11-20 MED ORDER — SODIUM CHLORIDE 0.9 % IV SOLN: INTRAVENOUS | Status: DC

## 2021-11-20 MED ORDER — ACETAMINOPHEN 325 MG PO TABS
650.0000 mg | ORAL_TABLET | Freq: Four times a day (QID) | ORAL | Status: DC | PRN
  Administered 2021-11-22: 650 mg via ORAL
  Filled 2021-11-20: qty 2

## 2021-11-20 MED ORDER — ACETAMINOPHEN 650 MG RE SUPP: 650.0000 mg | Freq: Four times a day (QID) | RECTAL | Status: DC | PRN

## 2021-11-20 MED ORDER — PIOGLITAZONE HCL 15 MG PO TABS
15.0000 mg | ORAL_TABLET | Freq: Every day | ORAL | Status: DC
  Filled 2021-11-20: qty 1

## 2021-11-20 MED ORDER — ASPIRIN 81 MG PO TBEC
81.0000 mg | DELAYED_RELEASE_TABLET | Freq: Every day | ORAL | Status: DC
  Administered 2021-11-21 – 2021-11-25 (×5): 81 mg via ORAL
  Filled 2021-11-20 (×5): qty 1

## 2021-11-20 MED ORDER — ENOXAPARIN SODIUM 40 MG/0.4ML IJ SOSY
40.0000 mg | PREFILLED_SYRINGE | INTRAMUSCULAR | Status: DC
  Administered 2021-11-21 – 2021-11-25 (×5): 40 mg via SUBCUTANEOUS
  Filled 2021-11-20 (×5): qty 0.4

## 2021-11-20 MED ORDER — PRAVASTATIN SODIUM 20 MG PO TABS: 20.0000 mg | ORAL_TABLET | Freq: Every day | ORAL | Status: DC

## 2021-11-20 MED ORDER — DOXYCYCLINE HYCLATE 50 MG PO CAPS: 100.0000 mg | ORAL_CAPSULE | Freq: Two times a day (BID) | ORAL | 0 refills | Status: DC

## 2021-11-20 MED ORDER — SODIUM CHLORIDE 0.9 % IV BOLUS
1000.0000 mL | Freq: Once | INTRAVENOUS | Status: AC
  Administered 2021-11-20: 1000 mL via INTRAVENOUS

## 2021-11-20 MED ORDER — ONDANSETRON HCL 4 MG/2ML IJ SOLN: 4.0000 mg | Freq: Four times a day (QID) | INTRAMUSCULAR | Status: DC | PRN

## 2021-11-20 MED ORDER — TRAZODONE HCL 50 MG PO TABS: 25.0000 mg | ORAL_TABLET | Freq: Every evening | ORAL | Status: DC | PRN

## 2021-11-20 NOTE — ED Notes (Signed)
Santiago Glad (caregiver) [phone]

## 2021-11-20 NOTE — H&P (Addendum)
[place]   PATIENT NAME: Derek Lynch    MR#:  [id number]  DATE OF BIRTH:  23-Nov-1940  DATE OF ADMISSION:  11/20/2021  PRIMARY CARE PHYSICIAN: Cheryll Dessert, FNP   Patient is coming from: Home  REQUESTING/REFERRING PHYSICIAN: Greig Right, PA-C  CHIEF COMPLAINT:   Chief Complaint  Patient presents with   Fall    HISTORY OF PRESENT ILLNESS:  Derek Lynch is a 81 y.o. Caucasian male with medical history significant for osteoarthritis and dyslipidemia, presented to the emergency room with acute onset of fall after being stuck in his left recliner for about 10 hours and finally getting up.  He denied any presyncope or syncope or paresthesias or focal muscle weakness.  He admits to recent cough without wheezing or dyspnea.  He had left lower extremity erythema with mild tenderness.  No fever or chills.  No dysuria, oliguria or hematuria or flank pain.  No chest pain or palpitations.  ED Course: When he came to the ER, BP was 141/81 with heart rate of 104 and otherwise normal vital signs.  Labs revealed a creatinine of 1.43 and a blood glucose of 154.  His total CK was 10,147 and CBC showed leukocytosis 12.9. EKG as reviewed by me : EKG showed sinus tachycardia with rate 103 with minimal voltage criteria for LVH Imaging: Noncontrasted CT scan revealed no acute intracranial abnormality and it did show chronic bilateral ethmoid sinusitis, chronic encephalomalacia in both frontal lobes and CT of the C-spine revealed cervical spondylosis with degenerative disc disease causing multilevel osseous foraminal impingement.  It showed potential mild central narrowing of the thecal sac at C5-C6 due to posterior osseous ringing.  Two-view chest x-ray showed cardiomegaly and streaky and hazy airspace disease at the bases that may be due to atelectasis or pneumonia with sepsis back that small pleural effusions.  The patient was given 100 mg p.o. doxycycline and 1 L bolus of IV normal saline.   He will be admitted to a medical bed for further evaluation and management. PAST MEDICAL HISTORY:   Past Medical History:  Diagnosis Date   Arthritis    Hyperlipidemia     PAST SURGICAL HISTORY:   Past Surgical History:  Procedure Laterality Date   COLONOSCOPY      SOCIAL HISTORY:   Social History   Tobacco Use   Smoking status: Former   Smokeless tobacco: Never  Substance Use Topics   Alcohol use: No    FAMILY HISTORY:   Family History  Problem Relation Age of Onset   Heart attack Mother    Stroke Father    Pancreatic cancer Father     DRUG ALLERGIES:   Allergies  Allergen Reactions   Penicillin G Other (See Comments)    REVIEW OF SYSTEMS:   ROS As per history of present illness. All pertinent systems were reviewed above. Constitutional, HEENT, cardiovascular, respiratory, GI, GU, musculoskeletal, neuro, psychiatric, endocrine, integumentary and hematologic systems were reviewed and are otherwise negative/unremarkable except for positive findings mentioned above in the HPI.   MEDICATIONS AT HOME:   Prior to Admission medications   Medication Sig Start Date End Date Taking? Authorizing Provider  acetaminophen (TYLENOL) 325 MG tablet Take 650 mg by mouth every 6 (six) hours as needed for mild pain, moderate pain or headache.   Yes [provider]  Ascorbic Acid (VITAMIN C) 1000 MG tablet Take 1,000 mg by mouth daily.   Yes [provider]  aspirin EC 81 MG  tablet Take 81 mg by mouth daily.   Yes [provider]  cholecalciferol (VITAMIN D) 1000 units tablet Take 1,000 Units by mouth daily.   Yes [provider]  clotrimazole-betamethasone (LOTRISONE) cream Apply 1 Application topically 2 (two) times daily.   Yes [provider]  doxycycline (VIBRAMYCIN) 50 MG capsule Take 2 capsules (100 mg total) by mouth 2 (two) times daily for 7 days. 11/20/21 11/27/21 Yes Lucillie Garfinkel, MD  furosemide (LASIX) 20 MG tablet Take 20  mg by mouth daily. 03/02/17  Yes [provider]  lisinopril (ZESTRIL) 2.5 MG tablet Take 2.5 mg by mouth daily.   Yes [provider]  lovastatin (MEVACOR) 20 MG tablet Take 20 mg by mouth daily at 6 PM. 01/05/17  Yes [provider]  metFORMIN (GLUCOPHAGE) 500 MG tablet Take by mouth daily with breakfast.   Yes [provider]  pioglitazone (ACTOS) 15 MG tablet Take 15 mg by mouth daily.   Yes [provider]      VITAL SIGNS:  Blood pressure 134/83, pulse (!) 105, temperature 98.8 F (37.1 C), resp. rate 20, height 5\' 4"  (1.626 m), weight 79.8 kg, SpO2 97 %.  PHYSICAL EXAMINATION:  Physical Exam  GENERAL:  81 y.o.-year-old Caucasian male patient lying in the bed with no acute distress.  EYES: Pupils equal, round, reactive to light and accommodation. No scleral icterus. Extraocular muscles intact.  HEENT: Head atraumatic, normocephalic. Oropharynx and nasopharynx clear.  NECK:  Supple, no jugular venous distention. No thyroid enlargement, no tenderness.  LUNGS: Normal breath sounds bilaterally, no wheezing, rales,rhonchi or crepitation. No use of accessory muscles of respiration.  CARDIOVASCULAR: Regular rate and rhythm, S1, S2 normal. No murmurs, rubs, or gallops.  ABDOMEN: Soft, nondistended, nontender. Bowel sounds present. No organomegaly or mass.  EXTREMITIES: 1+ bilateral lower extremity pitting edema with no cyanosis, or clubbing.  NEUROLOGIC: Cranial nerves II through XII are intact. Muscle strength 5/5 in all extremities. Sensation intact. Gait not checked.  PSYCHIATRIC: The patient is alert and oriented x 3.  Normal affect and good eye contact. SKIN: Bilateral lower extremity erythema with significant induration and worse erythema on the left side with associated tenderness.   LABORATORY PANEL:   CBC Recent Labs  Lab 11/21/21 0350  WBC 9.0  HGB 12.3*  HCT 36.2*  PLT 204    ------------------------------------------------------------------------------------------------------------------  Chemistries  Recent Labs  Lab 11/20/21 1611  NA 137  K 4.0  CL 101  CO2 22  GLUCOSE 154*  BUN 20  CREATININE 1.43*  CALCIUM 9.2   ------------------------------------------------------------------------------------------------------------------  Cardiac Enzymes No results for input(s): "TROPONINI" in the last 168 hours. ------------------------------------------------------------------------------------------------------------------  RADIOLOGY:  DG Chest 2 View  Result Date: 11/20/2021 CLINICAL DATA:  Shortness of breath EXAM: CHEST - 2 VIEW COMPARISON:  05/18/2019 report FINDINGS: Cardiomegaly. Streaky and hazy airspace disease at the bases. Suspect small pleural effusions. No pneumothorax. IMPRESSION: 1. Streaky and hazy airspace disease at the bases may be due to atelectasis or pneumonia. Suspect small pleural effusions. 2. Cardiomegaly. Electronically Signed   By: Donavan Foil M.D.   On: 11/20/2021 17:35   CT HEAD WO CONTRAST (5MM)  Result Date: 11/20/2021 CLINICAL DATA:  Multiple falls, most recently this morning EXAM: CT HEAD WITHOUT CONTRAST CT CERVICAL SPINE WITHOUT CONTRAST TECHNIQUE: Multidetector CT imaging of the head and cervical spine was performed following the standard protocol without intravenous contrast. Multiplanar CT image reconstructions of the cervical spine were also generated. RADIATION DOSE REDUCTION: This exam was  performed according to the departmental dose-optimization program which includes automated exposure control, adjustment of the mA and/or kV according to patient size and/or use of iterative reconstruction technique. COMPARISON:  None Available. FINDINGS: CT HEAD FINDINGS Brain: Symmetric chronic encephalomalacia the bilateral anterior superior frontal lobes with some involvement of the right anterior inferior frontal lobe. Otherwise,  the brainstem, cerebellum, cerebral peduncles, thalamus, basal ganglia, basilar cisterns, and ventricular system appear within normal limits. No intracranial hemorrhage, mass lesion, or acute CVA. Vascular: There is atherosclerotic calcification of the cavernous carotid arteries bilaterally. Skull: Unremarkable Sinuses/Orbits: Chronic bilateral ethmoid sinusitis. Hypoplastic frontal sinuses. Other: No supplemental non-categorized findings. CT CERVICAL SPINE FINDINGS Alignment: No vertebral subluxation is observed. Skull base and vertebrae: Spurring at the anterior C1-2 articulation with some chronically fragmented spur just above the joint on image 38 series 6. No cervical spine fracture or acute bony findings. Soft tissues and spinal canal: Unremarkable Disc levels: There is osseous foraminal stenosis on the left at C4-5, C5-6, and C6-7; and on the right C3-4, C4-5, C5-6, and C6-7. Potential mild central narrowing of the thecal sac at C5-6 due to posterior osseous ridging. Upper chest: Unremarkable Other: No supplemental non-categorized findings. IMPRESSION: 1. No acute intracranial findings or acute cervical spine findings. 2. 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 3. Chronic bilateral ethmoid sinusitis. 4. 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 5. Potential mild central narrowing of the thecal sac at C5-6 due to posterior osseous ridging. Electronically Signed   By: Gaylyn Rong M.D.   On: 11/20/2021 16:52   CT Cervical Spine Wo Contrast  Result Date: 11/20/2021 CLINICAL DATA:  Multiple falls, most recently this morning EXAM: CT HEAD WITHOUT CONTRAST CT CERVICAL SPINE WITHOUT CONTRAST TECHNIQUE: Multidetector CT imaging of the head and cervical spine was performed following the standard protocol without intravenous contrast. Multiplanar CT image reconstructions of the cervical spine were also generated. RADIATION DOSE  REDUCTION: This exam was performed according to the departmental dose-optimization program which includes automated exposure control, adjustment of the mA and/or kV according to patient size and/or use of iterative reconstruction technique. COMPARISON:  None Available. FINDINGS: CT HEAD FINDINGS Brain: Symmetric chronic encephalomalacia the bilateral anterior superior frontal lobes with some involvement of the right anterior inferior frontal lobe. Otherwise, the brainstem, cerebellum, cerebral peduncles, thalamus, basal ganglia, basilar cisterns, and ventricular system appear within normal limits. No intracranial hemorrhage, mass lesion, or acute CVA. Vascular: There is atherosclerotic calcification of the cavernous carotid arteries bilaterally. Skull: Unremarkable Sinuses/Orbits: Chronic bilateral ethmoid sinusitis. Hypoplastic frontal sinuses. Other: No supplemental non-categorized findings. CT CERVICAL SPINE FINDINGS Alignment: No vertebral subluxation is observed. Skull base and vertebrae: Spurring at the anterior C1-2 articulation with some chronically fragmented spur just above the joint on image 38 series 6. No cervical spine fracture or acute bony findings. Soft tissues and spinal canal: Unremarkable Disc levels: There is osseous foraminal stenosis on the left at C4-5, C5-6, and C6-7; and on the right C3-4, C4-5, C5-6, and C6-7. Potential mild central narrowing of the thecal sac at C5-6 due to posterior osseous ridging. Upper chest: Unremarkable Other: No supplemental non-categorized findings. IMPRESSION: 1. No acute intracranial findings or acute cervical spine findings. 2. 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 3. Chronic bilateral ethmoid sinusitis. 4. 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 5. Potential mild central narrowing of the thecal sac at C5-6 due to posterior osseous ridging. Electronically Signed   By: Zollie Beckers  Ova Freshwater M.D.   On: 11/20/2021 16:52      IMPRESSION AND PLAN:  Assessment and Plan: * Rhabdomyolysis - The patient will be admitted to a medical bed. - This is traumatic rhabdomyolysis due to mechanical fall. - We will continue aggressive hydration with IV normal saline. - We will follow serial CK levels as well as MB levels. - Pain management will be provided for myalgia associated with his rhabdomyolysis.  Sepsis due to cellulitis [facility]) - This is more prominent in the left lower extremity. - We will place him on IV Rocephin and follow blood cultures. - Sepsis manifested by his leukocytosis and tachycardia. - He will be hydrated with IV normal saline as mentioned above. - I doubt pneumonia in his case.  Per him and his wife his cough has not been worse than his baseline.  It should be covered anyway with IV Rocephin.  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 - We will continue Actos and hold off metformin, given elevated creatinine likely secondary to mild acute kidney injury..  Dyslipidemia - We will do off statin therapy given significant rhabdomyolysis.   DVT prophylaxis: Lovenox. Advanced Care Planning:  Code Status: DNR/DNI.  This was discussed with him and his wife. Family Communication:  The plan of care was discussed in details with the patient (and family). I answered all questions. The patient agreed to proceed with the above mentioned plan. Further management will depend upon hospital course. Disposition Plan: Back to previous home environment Consults called: none. All the records are reviewed and case discussed with ED provider.  Status is: Inpatient   At the time of the admission, it appears that the appropriate admission status for this patient is inpatient.  This is judged to be reasonable and necessary in order to provide the required intensity of service to ensure the patient's safety  given the presenting symptoms, physical exam findings and initial radiographic and laboratory data in the context of comorbid conditions.  The patient requires inpatient status due to high intensity of service, high risk of further deterioration and high frequency of surveillance required.  I certify that at the time of admission, it is my clinical judgment that the patient will require inpatient hospital care extending more than 2 midnights.                            Dispo: The patient is from: Home              Anticipated d/c is to: Home              Patient currently is not medically stable to d/c.              Difficult to place patient: No  Hannah Beat M.D on 11/21/2021 at 6:20 AM  [facility]   From 7 PM-7 AM, contact night-coverage [email or web address]  CC: Primary care physician; Cheryll Dessert, FNP

## 2021-11-20 NOTE — ED Triage Notes (Signed)
Pt come with c/o fall. Pt states he fell out of chair. Pt did hit head. Pt states little sob. Pt has had two falls today. Pt states he hit his right arm.

## 2021-11-20 NOTE — ED Provider Notes (Signed)
[facility] Provider Note    Event Date/Time   First MD Initiated Contact with Patient 11/20/21 1650     (approximate)   History   Fall   HPI  Derek Lynch is a 81 y.o. male   Past medical history of arthritis, hyperlipidemia, imbalance, ambulatory with walker at home, has caretaker at home who presents to the emergency department after being stuck in his chair left this morning, and needed assistance to get out after struggling for a few hours.  Later that day he had a mechanical slip and fall while trying to ambulate to the bathroom without his walker, with no head strike or loss of consciousness and had an abrasion to the right elbow.    He has some soreness throughout his body and especially his bilateral upper and lower back, arms, legs.  He is otherwise been in his regular state of health and denies any focal infectious symptoms like respiratory infectious symptoms or dysuria, frequency, diarrhea nausea or vomiting.  Denies chest pain or shortness of breath.  No other medical complaints at this time.    History was obtained via the patient. Independent historian includes his caretaker who is at bedside for collateral information. External medical chart review includes notes from family medicine in early October 2023 with arrangement of OT/assistance with hygiene care      Physical Exam   Triage Vital Signs: ED Triage Vitals  Enc Vitals Group     BP 11/20/21 1608 (!) 141/81     Pulse Rate 11/20/21 1608 (!) 104     Resp 11/20/21 1608 20     Temp 11/20/21 1608 98.3 F (36.8 C)     Temp src --      SpO2 11/20/21 1608 98 %     Weight --      Height --      Head Circumference --      Peak Flow --      Pain Score 11/20/21 1607 3     Pain Loc --      Pain Edu? --      Excl. in [place]? --     Most recent vital signs: Vitals:   11/20/21 1608  BP: (!) 141/81  Pulse: (!) 104  Resp: 20  Temp: 98.3 F (36.8 C)  SpO2: 98%    General: Awake,  no distress.  CV:  Good peripheral perfusion.  Resp:  Normal effort.  Lungs clear Abd:  No distention.  Nontender to palpation. Other:  Moves all extremities with full active range of motion, small abrasion to the right elbow, no bony tenderness.  No midline CT or L-spine tenderness.  Abdomen is soft and nontender, no signs of thoracic injuries or head trauma.   ED Results / Procedures / Treatments   Labs (all labs ordered are listed, but only abnormal results are displayed) Labs Reviewed  CBC - Abnormal; Notable for the following components:      Result Value   WBC 12.9 (*)    All other components within normal limits  BASIC METABOLIC PANEL - Abnormal; Notable for the following components:   Glucose, Bld 154 (*)    Creatinine, Ser 1.43 (*)    GFR, Estimated 50 (*)    All other components within normal limits  CK     I reviewed labs and they are notable for fattening of 1.43 with a baseline of 1.2 on outside medical record review obtained in February 2023.  EKG  ED ECG REPORT I, Pilar Jarvis, the attending physician, personally viewed and interpreted this ECG.   Date: 11/20/2021  EKG Time: 1610  Rate: 103  Rhythm: sinus tachycardia  Axis: nl  Intervals:none  ST&T Change: Tremulous baseline but appears to be in sinus rhythm, no ischemic changes    RADIOLOGY I independently reviewed and interpreted chest x-ray and see no focal opacities.   PROCEDURES:  Critical Care performed: No  Procedures   MEDICATIONS ORDERED IN ED: Medications  doxycycline (VIBRA-TABS) tablet 100 mg (has no administration in time range)     IMPRESSION / MDM / ASSESSMENT AND PLAN / ED COURSE  I reviewed the triage vital signs and the nursing notes.                              Differential diagnosis includes, but is not limited to, acute traumatic injury from fall including intracranial bleeding, cervical spine fractures or dislocation, AKI, rhabdo, respiratory or urinary infection  considered but less likely ACS or cardiogenic syncope and mechanical nature of second fall   MDM: Patient with some whole body soreness after being stuck in his chair for several hours this morning and sustaining a second mechanical fall with no significant injuries.  For traumatic assessment will obtain CT head and neck which was negative for acute fractures dislocation or intracranial bleeding.  Chest x-ray shows some streakiness at the bases concerning for infectious etiology, patient without respiratory infectious symptoms, though some element of early infection may have contributed to his inability to get up out of his chair versus global deconditioning, will give short course of antibiotics.  Labs appear normal, mildly elevated white blood cell count is nonspecific, creatinine is at baseline, will check CK for rhabdomyolysis given he struggled in his chair for several hours.  He has no other complaints other than some soreness at this time and has caretaker at home and would like to be discharged home after his medical evaluation if not significant for any emergent pathologies.   7:54 PM Patient is eager to go home and eat dinner.  Does well.  His chest x-ray had some infiltrates at the bases concerning for pneumonia, with a white blood cell count slightly elevated but no focal infectious symptoms, will err on the side of safety and treat him in case of early infection.  He agrees.  Doxycycline given in the emergency department and prescription sent.  CURB65 =2,  is moderate, inpatient versus outpatient with close follow-up of both recommended and the patient is adamant he would like to go home.  CK still pending.  He understands that if this is elevated and his rhabdomyolysis that he will agree to be inpatient.  If normal, discharge home as above.  This plan was signed out to my colleague, pending CK.  Patient's presentation is most consistent with acute presentation with potential threat to life  or bodily function.       FINAL CLINICAL IMPRESSION(S) / ED DIAGNOSES   Final diagnoses:  Fall, initial encounter  Pulmonary infiltrates on CXR     Rx / DC Orders   ED Discharge Orders          Ordered    doxycycline (VIBRAMYCIN) 50 MG capsule  2 times daily        11/20/21 1953             Note:  This document was prepared using Dragon voice recognition software and  may include unintentional dictation errors.    Pilar Jarvis, MD 11/20/21 [phone]

## 2021-11-20 NOTE — ED Notes (Signed)
Lab sts the CK is still running at this time.

## 2021-11-20 NOTE — ED Notes (Signed)
ED Provider at bedside. 

## 2021-11-20 NOTE — ED Triage Notes (Signed)
First Nurse Note:  Arrives from home via [facility].  C/O fall, hit head.  Second fall today. VS wnl.  CBG:  201

## 2021-11-20 NOTE — ED Provider Notes (Signed)
  Physical Exam  BP (!) 141/81   Pulse (!) 104   Temp 98.3 F (36.8 C)   Resp 20   SpO2 98%   Physical Exam  Procedures  .Critical Care E&M  Performed by: Versie Starks, PA-C Critical care provider statement:    Critical care time (minutes):  45   Critical care time was exclusive of:  Separately billable procedures and treating other patients   Critical care was necessary to treat or prevent imminent or life-threatening deterioration of the following conditions:  Dehydration   Critical care was time spent personally by me on the following activities:  Blood draw for specimens, development of treatment plan with patient or surrogate, evaluation of patient's response to treatment, examination of patient, obtaining history from patient or surrogate, ordering and performing treatments and interventions and ordering and review of laboratory studies   Care discussed with: admitting provider   After initial E/M assessment, critical care services were subsequently performed that were exclusive of separately billable procedures or treatment.     ED Course / MDM    Medical Decision Making Amount and/or Complexity of Data Reviewed Labs: ordered. Radiology: ordered.  Risk Prescription drug management. Decision regarding hospitalization.   Assuming care from Dr. Jacelyn Grip.  Patient has a pneumonia but he is most concerned about CK level due to the mechanism of injury for the patient was stuck in a lift recliner overnight.  Patient is diabetic and has not had his diabetes medications.  Has not had anything to eat.  CK level returned at [place] indicating rhabdomyolysis.  We will go ahead and consult hospitalist for admission  Nursing staff instructed to start fluids, he is already been given 1 doxycycline here tonight so we will defer antibiotics to the hospitalist  Spoke with Dr. Sidney Ace, will be admitting patient.  For rhabdomyolysis.    Versie Starks, PA-C 11/20/21 2348    Lucillie Garfinkel, MD 11/21/21 2024

## 2021-11-20 NOTE — Discharge Instructions (Addendum)
Take antibiotics.   Thank you for choosing [place] for your health care today!  Please see your primary doctor this week for a follow up appointment.   If you do not have a primary doctor call the following clinics to establish care:  If you have insurance:  [facility] [phone] [place] [address]   [facility]  [phone] [place] [place] [address]   If you do not have insurance:  [facility]  [phone] [address], [place] [address]  Sometimes, in the early stages of certain disease courses it is difficult to detect in the emergency department evaluation -- so, it is important that you continue to monitor your symptoms and call your doctor right away or return to the emergency department if you develop any new or worsening symptoms.  It was my pleasure to care for you today.   Hoover Brunette Jacelyn Grip, MD

## 2021-11-20 NOTE — ED Notes (Addendum)
Called lab again and they said they are still working on it and can't give me a time when it will be done. PA Caryn Section was notified and family was notified of the delay and updated. Agricultural consultant also notified.

## 2021-11-21 ENCOUNTER — Encounter: Payer: Self-pay | Admitting: Family Medicine

## 2021-11-21 DIAGNOSIS — A419 Sepsis, unspecified organism: Secondary | ICD-10-CM

## 2021-11-21 DIAGNOSIS — N179 Acute kidney failure, unspecified: Secondary | ICD-10-CM

## 2021-11-21 DIAGNOSIS — W19XXXA Unspecified fall, initial encounter: Secondary | ICD-10-CM

## 2021-11-21 DIAGNOSIS — E785 Hyperlipidemia, unspecified: Secondary | ICD-10-CM | POA: Insufficient documentation

## 2021-11-21 DIAGNOSIS — E119 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: Secondary | ICD-10-CM

## 2021-11-21 DIAGNOSIS — T796XXA Traumatic ischemia of muscle, initial encounter: Secondary | ICD-10-CM

## 2021-11-21 LAB — LACTIC ACID, PLASMA
Lactic Acid, Venous: 0.9 mmol/L (ref 0.5–1.9)
Lactic Acid, Venous: 1.2 mmol/L (ref 0.5–1.9)

## 2021-11-21 LAB — CBC
HCT: 36.2 % — ABNORMAL LOW (ref 39.0–52.0)
Hemoglobin: 12.3 g/dL — ABNORMAL LOW (ref 13.0–17.0)
MCH: 30.8 pg (ref 26.0–34.0)
MCHC: 34 g/dL (ref 30.0–36.0)
MCV: 90.7 fL (ref 80.0–100.0)
Platelets: 204 10*3/uL (ref 150–400)
RBC: 3.99 MIL/uL — ABNORMAL LOW (ref 4.22–5.81)
RDW: 13.2 % (ref 11.5–15.5)
WBC: 9 10*3/uL (ref 4.0–10.5)
nRBC: 0 % (ref 0.0–0.2)

## 2021-11-21 LAB — PROTIME-INR
INR: 1.2 (ref 0.8–1.2)
Prothrombin Time: 14.6 seconds (ref 11.4–15.2)

## 2021-11-21 LAB — BASIC METABOLIC PANEL
Anion gap: 10 (ref 5–15)
BUN: 18 mg/dL (ref 8–23)
CO2: 23 mmol/L (ref 22–32)
Calcium: 8.3 mg/dL — ABNORMAL LOW (ref 8.9–10.3)
Chloride: 103 mmol/L (ref 98–111)
Creatinine, Ser: 1.18 mg/dL (ref 0.61–1.24)
GFR, Estimated: >60 mL/min (ref >60)
Glucose, Bld: 172 mg/dL — ABNORMAL HIGH (ref 70–99)
Potassium: 3.6 mmol/L (ref 3.5–5.1)
Sodium: 136 mmol/L (ref 135–145)

## 2021-11-21 LAB — CK
Total CK: 5167 U/L — ABNORMAL HIGH (ref 49–397)
Total CK: 4284 U/L — ABNORMAL HIGH (ref 49–397)
Total CK: 3400 U/L — ABNORMAL HIGH (ref 49–397)

## 2021-11-21 LAB — GLUCOSE, CAPILLARY
Glucose-Capillary: 158 mg/dL — ABNORMAL HIGH (ref 70–99)
Glucose-Capillary: 202 mg/dL — ABNORMAL HIGH (ref 70–99)
Glucose-Capillary: 157 mg/dL — ABNORMAL HIGH (ref 70–99)
Glucose-Capillary: 193 mg/dL — ABNORMAL HIGH (ref 70–99)
Glucose-Capillary: 232 mg/dL — ABNORMAL HIGH (ref 70–99)

## 2021-11-21 LAB — CKMB (ARMC ONLY): CK, MB: 28.1 ng/mL — ABNORMAL HIGH (ref 0.5–5.0)

## 2021-11-21 LAB — HEPATIC FUNCTION PANEL
ALT: 52 U/L — ABNORMAL HIGH (ref 0–44)
AST: 154 U/L — ABNORMAL HIGH (ref 15–41)
Albumin: 3.1 g/dL — ABNORMAL LOW (ref 3.5–5.0)
Alkaline Phosphatase: 30 U/L — ABNORMAL LOW (ref 38–126)
Bilirubin, Direct: 0.2 mg/dL (ref 0.0–0.2)
Indirect Bilirubin: 0.6 mg/dL (ref 0.3–0.9)
Total Bilirubin: 0.8 mg/dL (ref 0.3–1.2)
Total Protein: 6.5 g/dL (ref 6.5–8.1)

## 2021-11-21 LAB — HEMOGLOBIN A1C
Hgb A1c MFr Bld: 6.4 % — ABNORMAL HIGH (ref 4.8–5.6)
Mean Plasma Glucose: 136.98 mg/dL

## 2021-11-21 LAB — APTT: aPTT: 30 seconds (ref 24–36)

## 2021-11-21 MED ORDER — INSULIN ASPART 100 UNIT/ML IJ SOLN: 0.0000 [IU] | Freq: Every day | INTRAMUSCULAR | Status: DC

## 2021-11-21 MED ORDER — INFLUENZA VAC A&B SA ADJ QUAD 0.5 ML IM PRSY
0.5000 mL | PREFILLED_SYRINGE | INTRAMUSCULAR | Status: AC
  Administered 2021-11-23: 0.5 mL via INTRAMUSCULAR
  Filled 2021-11-21: qty 0.5

## 2021-11-21 MED ORDER — PNEUMOCOCCAL 20-VAL CONJ VACC 0.5 ML IM SUSY
0.5000 mL | PREFILLED_SYRINGE | INTRAMUSCULAR | Status: AC
  Administered 2021-11-23: 0.5 mL via INTRAMUSCULAR
  Filled 2021-11-21: qty 0.5

## 2021-11-21 MED ORDER — SODIUM CHLORIDE 0.9 % IV SOLN: INTRAVENOUS | Status: AC

## 2021-11-21 MED ORDER — SODIUM CHLORIDE 0.9 % IV SOLN
2.0000 g | INTRAVENOUS | Status: DC
  Administered 2021-11-21 – 2021-11-25 (×5): 2 g via INTRAVENOUS
  Filled 2021-11-21: qty 2
  Filled 2021-11-21: qty 20
  Filled 2021-11-21 (×2): qty 2
  Filled 2021-11-21: qty 20

## 2021-11-21 MED ORDER — INSULIN ASPART 100 UNIT/ML IJ SOLN
0.0000 [IU] | Freq: Three times a day (TID) | INTRAMUSCULAR | Status: DC
  Administered 2021-11-21: 3 [IU] via SUBCUTANEOUS
  Administered 2021-11-21 – 2021-11-22 (×5): 2 [IU] via SUBCUTANEOUS
  Administered 2021-11-23: 1 [IU] via SUBCUTANEOUS
  Administered 2021-11-23: 2 [IU] via SUBCUTANEOUS
  Administered 2021-11-23: 1 [IU] via SUBCUTANEOUS
  Administered 2021-11-24: 2 [IU] via SUBCUTANEOUS
  Administered 2021-11-24 – 2021-11-25 (×4): 1 [IU] via SUBCUTANEOUS
  Filled 2021-11-21 (×14): qty 1

## 2021-11-21 NOTE — Assessment & Plan Note (Signed)
-   We will do off statin therapy given significant rhabdomyolysis.

## 2021-11-21 NOTE — Assessment & Plan Note (Addendum)
-   The patient will be admitted to a medical bed. - This is traumatic rhabdomyolysis due to mechanical fall. - We will continue aggressive hydration with IV normal saline. - We will follow serial CK levels as well as MB levels. - Pain management will be provided for myalgia associated with his rhabdomyolysis.

## 2021-11-21 NOTE — Assessment & Plan Note (Signed)
-   We will place him on supplement coverage with NovoLog. - We will continue Actos and hold off metformin, given elevated creatinine likely secondary to mild acute kidney injury.Marland Kitchen

## 2021-11-21 NOTE — Assessment & Plan Note (Addendum)
-   This is more prominent in the left lower extremity. - We will place him on IV Rocephin and follow blood cultures. - Sepsis manifested by his leukocytosis and tachycardia. - He will be hydrated with IV normal saline as mentioned above. - I doubt pneumonia in his case.  Per him and his wife his cough has not been worse than his baseline.  It should be covered anyway with IV Rocephin.

## 2021-11-21 NOTE — Progress Notes (Signed)
  Chaplain On-Call responded to Spiritual Care Consult Order from Eugenie Norrie, MD.  Chaplain visited the patient and offered supportive listening as he described the fall at his home, which brought him to the hospital.  Chaplain provided spiritual and emotional support.  Chaplain Pollyann Samples M.Div., [facility]

## 2021-11-21 NOTE — TOC Progression Note (Signed)
Transition of Care [facility]) - Progression Note    Patient Details  Name: Derek Lynch MRN: [id number] Date of Birth: 12-Sep-1940  Transition of Care [facility]) CM/SW Contact  Laurena Slimmer, RN Phone Number: 11/21/2021, 4:05 PM  Clinical Narrative:    Will monitor for PT recommendations.         Expected Discharge Plan and Services                                                 Social Determinants of Health (SDOH) Interventions    Readmission Risk Interventions     No data to display

## 2021-11-21 NOTE — Plan of Care (Signed)

## 2021-11-21 NOTE — Progress Notes (Signed)
Derek Lynch, is a 81 y.o. male, DOB - [birth year], PYK:[id number] Admit date - 11/20/2021    Outpatient Primary MD for the patient is [name], [place]  LOS - 1  days  Chief Complaint  Patient presents with   Fall       Brief summary   Patient is a 81 year old male with osteoarthritis, dyslipidemia, presented with acute onset of fall after being stuck in his recliner for about 10 hours and finally getting up.  He denied any presyncope or syncope or paresthesias or focal muscle weakness.  He reported recent cough without wheezing or dyspnea.  Patient had left lower extremity erythema with mild tenderness. In ED, BP 141/81 with heart rate of 104. Creatinine 1.43, CK 10,147, leukocytosis 12.9 CT head showed no acute intracranial abnormality, showed chronic bilateral ethmoid sinusitis, chronic encephalomalacia CT of the C-spine revealed cervical spondylosis with degenerative disc disease causing multilevel osseous foraminal impingement. Chest x-ray showed cardiomegaly, streaky and hazy airspace disease at the bases that may be due to atelectasis or pneumonia.   Assessment & Plan    Principal Problem:   Rhabdomyolysis, acute -Traumatic rhabdomyolysis due to mechanical fall -CK 10,147 on admission -Continue IV fluid hydration   Active Problems:   Sepsis due to cellulitis ([place]), left lower extremity -Met sepsis criteria due to leukocytosis, tachycardia, source likely due to cellulitis -Continue IV Rocephin    Dyslipidemia -Hold off statin due to rhabdomyolysis  Mild transaminitis -Likely due to acute rhabdomyolysis    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 ([place]) -Continue sliding scale insulin, hold off oral hypoglycemics -  Recent Labs    11/21/21 0803  GLUCAP 158*   Acute kidney injury -Likely due to acute rhabdomyolysis, creatinine 1.43 on  admission -Improving, creatinine 1.18.  On IV fluid hydration    Fall -PT eval   Obesity Estimated body mass index is 30.21 kg/m as calculated from the following:   Height as of this encounter: _0  (1.626 m).   Weight as of this encounter: 79.8 kg.  Code Status: DNR DVT Prophylaxis:  enoxaparin (LOVENOX) injection 40 mg Start: 11/21/21 0800   Level of Care: Level of care: Med-Surg Family Communication: Updated patient   Disposition Plan:      Remains inpatient appropriate: On IV fluid hydration and IV antibiotics, follow CK, PT eval  Procedures:  None  Consultants:   None  Antimicrobials:   Anti-infectives (From admission, onward)    Start     Dose/Rate Route Frequency Ordered Stop   11/21/21 0700  cefTRIAXone (ROCEPHIN) 2 g in sodium chloride 0.9 % 100 mL IVPB        2 g 200 mL/hr over 30 Minutes Intravenous Every 24 hours 11/21/21 0601 11/28/21 0659   11/20/21 1930  doxycycline (VIBRA-TABS) tablet 100 mg        100 mg Oral  Once 11/20/21 1922 11/20/21 1954   11/20/21 0000  doxycycline (VIBRAMYCIN) 50 MG capsule        100  mg Oral 2 times daily 11/20/21 1953 11/27/21 2359          Medications  aspirin EC  81 mg Oral Daily   enoxaparin (LOVENOX) injection  40 mg Subcutaneous Q24H   [START ON 11/22/2021] influenza vaccine adjuvanted  0.5 mL Intramuscular Tomorrow-1000   insulin aspart  0-5 Units Subcutaneous QHS   insulin aspart  0-9 Units Subcutaneous TID WC   [START ON 11/22/2021] pneumococcal 20-valent conjugate vaccine  0.5 mL Intramuscular Tomorrow-1000      Subjective:   Derek Lynch was seen and examined today.  Left lower extremity redness and swelling noted.  No fevers or chills, coughing currently at baseline.  Patient denies dizziness, chest pain, Abdominal pain, N/V/D/C.   Objective:   Vitals:   11/20/21 1608 11/21/21 0207 11/21/21 0342 11/21/21 0747  BP: (!) 141/81 125/70 134/83 130/85  Pulse: (!) 104 100 (!) 105 90  Resp: 20 (!) _0 Temp: 98.3 F (36.8 C) 98.2 F (36.8 C) 98.8 F (37.1 C) 98.1 F (36.7 C)  TempSrc:  Oral    SpO2: 98% 99% 97% 96%  Weight:  79.8 kg    Height:  _1  (1.626 m)      Intake/Output Summary (Last 24 hours) at 11/21/2021 1141 Last data filed at 11/21/2021 1019 Gross per 24 hour  Intake 340.27 ml  Output 700 ml  Net -359.73 ml     Wt Readings from Last 3 Encounters:  11/21/21 79.8 kg  01/24/18 79.2 kg  07/21/17 83.5 kg     Exam General: Alert and oriented x 3, NAD Cardiovascular: S1 S2 auscultated,  RRR Respiratory: Clear to auscultation bilaterally Gastrointestinal: Soft, nontender, nondistended, + bowel sounds Ext: 1+ pedal edema bilaterally Neuro: Strength 5/5 upper and lower extremities bilaterally Skin: Left lower extremity erythema with tenderness Psych: Normal affect and demeanor, alert and oriented x3     Data Reviewed:  I have personally reviewed following labs    CBC Lab Results  Component Value Date   WBC 9.0 11/21/2021   RBC 3.99 (L) 11/21/2021   HGB 12.3 (L) 11/21/2021   HCT 36.2 (L) 11/21/2021   MCV 90.7 11/21/2021   MCH 30.8 11/21/2021   PLT 204 11/21/2021   MCHC 34.0 11/21/2021   RDW 13.2 26/94/8546     Last metabolic panel Lab Results  Component Value Date   NA 136 11/21/2021   K 3.6 11/21/2021   CL 103 11/21/2021   CO2 23 11/21/2021   BUN 18 11/21/2021   CREATININE 1.18 11/21/2021   GLUCOSE 172 (H) 11/21/2021   GFRNONAA >60 11/21/2021   CALCIUM 8.3 (L) 11/21/2021   PROT 6.5 11/21/2021   ALBUMIN 3.1 (L) 11/21/2021   BILITOT 0.8 11/21/2021   ALKPHOS 30 (L) 11/21/2021   AST 154 (H) 11/21/2021   ALT 52 (H) 11/21/2021   ANIONGAP 10 11/21/2021    CBG (last 3)  Recent Labs    11/21/21 0803  GLUCAP 158*      Coagulation Profile: Recent Labs  Lab 11/21/21 0734  INR 1.2     Radiology Studies: I have personally reviewed the imaging studies  DG Chest 2 View  Result Date: 11/20/2021 CLINICAL DATA:  Shortness of breath  EXAM: CHEST - 2 VIEW COMPARISON:  05/18/2019 report FINDINGS: Cardiomegaly. Streaky and hazy airspace disease at the bases. Suspect small pleural effusions. No pneumothorax. IMPRESSION: 1. Streaky and hazy airspace disease at the bases may be due to atelectasis or pneumonia. Suspect small pleural  effusions. 2. Cardiomegaly. Electronically Signed   By: Donavan Foil M.D.   On: 11/20/2021 17:35   CT HEAD WO CONTRAST (5MM)  Result Date: 11/20/2021 CLINICAL DATA:  Multiple falls, most recently this morning EXAM: CT HEAD WITHOUT CONTRAST CT CERVICAL SPINE WITHOUT CONTRAST TECHNIQUE: Multidetector CT imaging of the head and cervical spine was performed following the standard protocol without intravenous contrast. Multiplanar CT image reconstructions of the cervical spine were also generated. RADIATION DOSE REDUCTION: This exam was performed according to the departmental dose-optimization program which includes automated exposure control, adjustment of the mA and/or kV according to patient size and/or use of iterative reconstruction technique. COMPARISON:  None Available. FINDINGS: CT HEAD FINDINGS Brain: Symmetric chronic encephalomalacia the bilateral anterior superior frontal lobes with some involvement of the right anterior inferior frontal lobe. Otherwise, the brainstem, cerebellum, cerebral peduncles, thalamus, basal ganglia, basilar cisterns, and ventricular system appear within normal limits. No intracranial hemorrhage, mass lesion, or acute CVA. Vascular: There is atherosclerotic calcification of the cavernous carotid arteries bilaterally. Skull: Unremarkable Sinuses/Orbits: Chronic bilateral ethmoid sinusitis. Hypoplastic frontal sinuses. Other: No supplemental non-categorized findings. CT CERVICAL SPINE FINDINGS Alignment: No vertebral subluxation is observed. Skull base and vertebrae: Spurring at the anterior C1-2 articulation with some chronically fragmented spur just above the joint on image 38 series 6.  No cervical spine fracture or acute bony findings. Soft tissues and spinal canal: Unremarkable Disc levels: There is osseous foraminal stenosis on the left at C4-5, C5-6, and C6-7; and on the right C3-4, C4-5, C5-6, and C6-7. Potential mild central narrowing of the thecal sac at C5-6 due to posterior osseous ridging. Upper chest: Unremarkable Other: No supplemental non-categorized findings. IMPRESSION: 1. No acute intracranial findings or acute cervical spine findings. 2. 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 3. Chronic bilateral ethmoid sinusitis. 4. 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 5. Potential mild central narrowing of the thecal sac at C5-6 due to posterior osseous ridging. Electronically Signed   By: Van Clines M.D.   On: 11/20/2021 16:52   CT Cervical Spine Wo Contrast  Result Date: 11/20/2021 CLINICAL DATA:  Multiple falls, most recently this morning EXAM: CT HEAD WITHOUT CONTRAST CT CERVICAL SPINE WITHOUT CONTRAST TECHNIQUE: Multidetector CT imaging of the head and cervical spine was performed following the standard protocol without intravenous contrast. Multiplanar CT image reconstructions of the cervical spine were also generated. RADIATION DOSE REDUCTION: This exam was performed according to the departmental dose-optimization program which includes automated exposure control, adjustment of the mA and/or kV according to patient size and/or use of iterative reconstruction technique. COMPARISON:  None Available. FINDINGS: CT HEAD FINDINGS Brain: Symmetric chronic encephalomalacia the bilateral anterior superior frontal lobes with some involvement of the right anterior inferior frontal lobe. Otherwise, the brainstem, cerebellum, cerebral peduncles, thalamus, basal ganglia, basilar cisterns, and ventricular system appear within normal limits. No intracranial hemorrhage, mass lesion, or acute CVA. Vascular: There  is atherosclerotic calcification of the cavernous carotid arteries bilaterally. Skull: Unremarkable Sinuses/Orbits: Chronic bilateral ethmoid sinusitis. Hypoplastic frontal sinuses. Other: No supplemental non-categorized findings. CT CERVICAL SPINE FINDINGS Alignment: No vertebral subluxation is observed. Skull base and vertebrae: Spurring at the anterior C1-2 articulation with some chronically fragmented spur just above the joint on image 38 series 6. No cervical spine fracture or acute bony findings. Soft tissues and spinal canal: Unremarkable Disc levels: There is osseous foraminal stenosis on the left at C4-5, C5-6, and C6-7; and on the right C3-4, C4-5, C5-6, and C6-7.  Potential mild central narrowing of the thecal sac at C5-6 due to posterior osseous ridging. Upper chest: Unremarkable Other: No supplemental non-categorized findings. IMPRESSION: 1. No acute intracranial findings or acute cervical spine findings. 2. 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 3. Chronic bilateral ethmoid sinusitis. 4. 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 5. Potential mild central narrowing of the thecal sac at C5-6 due to posterior osseous ridging. Electronically Signed   By: Van Clines M.D.   On: 11/20/2021 16:52       Fransico Sciandra M.D. [facility] 11/21/2021, 11:41 AM  Available via Epic secure chat 7am-7pm After 7 pm, please refer to night coverage provider listed on amion.

## 2021-11-22 DIAGNOSIS — L03116 Cellulitis of left lower limb: Secondary | ICD-10-CM

## 2021-11-22 LAB — GLUCOSE, CAPILLARY
Glucose-Capillary: 198 mg/dL — ABNORMAL HIGH (ref 70–99)
Glucose-Capillary: 157 mg/dL — ABNORMAL HIGH (ref 70–99)
Glucose-Capillary: 170 mg/dL — ABNORMAL HIGH (ref 70–99)
Glucose-Capillary: 138 mg/dL — ABNORMAL HIGH (ref 70–99)

## 2021-11-22 LAB — COMPREHENSIVE METABOLIC PANEL
ALT: 56 U/L — ABNORMAL HIGH (ref 0–44)
AST: 109 U/L — ABNORMAL HIGH (ref 15–41)
Albumin: 2.8 g/dL — ABNORMAL LOW (ref 3.5–5.0)
Alkaline Phosphatase: 26 U/L — ABNORMAL LOW (ref 38–126)
Anion gap: 6 (ref 5–15)
BUN: 11 mg/dL (ref 8–23)
CO2: 21 mmol/L — ABNORMAL LOW (ref 22–32)
Calcium: 8 mg/dL — ABNORMAL LOW (ref 8.9–10.3)
Chloride: 109 mmol/L (ref 98–111)
Creatinine, Ser: 1.02 mg/dL (ref 0.61–1.24)
GFR, Estimated: >60 mL/min (ref >60)
Glucose, Bld: 188 mg/dL — ABNORMAL HIGH (ref 70–99)
Potassium: 3.5 mmol/L (ref 3.5–5.1)
Sodium: 136 mmol/L (ref 135–145)
Total Bilirubin: 0.4 mg/dL (ref 0.3–1.2)
Total Protein: 6 g/dL — ABNORMAL LOW (ref 6.5–8.1)

## 2021-11-22 LAB — CK: Total CK: 2523 U/L — ABNORMAL HIGH (ref 49–397)

## 2021-11-22 LAB — CKMB (ARMC ONLY): CK, MB: 5.3 ng/mL — ABNORMAL HIGH (ref 0.5–5.0)

## 2021-11-22 NOTE — Plan of Care (Signed)

## 2021-11-22 NOTE — Progress Notes (Signed)
Derek Lynch, is a 81 y.o. male, DOB - [birth year], XMI:[id number] Admit date - 11/20/2021    Outpatient Primary MD for the patient is [name], [place]  LOS - 2  days  Chief Complaint  Patient presents with   Fall       Brief summary   Patient is a 81 year old male with osteoarthritis, dyslipidemia, presented with acute onset of fall after being stuck in his recliner for about 10 hours and finally getting up.  He denied any presyncope or syncope or paresthesias or focal muscle weakness.  He reported recent cough without wheezing or dyspnea.  Patient had left lower extremity erythema with mild tenderness. In ED, BP 141/81 with heart rate of 104. Creatinine 1.43, CK 10,147, leukocytosis 12.9 CT head showed no acute intracranial abnormality, showed chronic bilateral ethmoid sinusitis, chronic encephalomalacia CT of the C-spine revealed cervical spondylosis with degenerative disc disease causing multilevel osseous foraminal impingement. Chest x-ray showed cardiomegaly, streaky and hazy airspace disease at the bases that may be due to atelectasis or pneumonia.   Assessment & Plan    Principal Problem:   Rhabdomyolysis, acute -Traumatic rhabdomyolysis due to mechanical fall -CK 10,147 on admission-> improved to 2523 today -Continue IV fluid hydration   Active Problems:   Sepsis due to cellulitis ([place]), left lower extremity -Met sepsis criteria due to leukocytosis, tachycardia, source likely due to cellulitis -Continue IV Rocephin, improving    Dyslipidemia -Hold off statin due to rhabdomyolysis  Mild transaminitis -Likely due to acute rhabdomyolysis, improving    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 (HCC) -Continue sliding scale insulin, hold off oral hypoglycemics -Hemoglobin A1c 6.4  Acute kidney injury -Likely due to acute rhabdomyolysis, creatinine  1.43 on admission -Continue IV fluid hydration, creatinine improved to 1.0    Fall with generalized debility -PT eval pending  Obesity Estimated body mass index is 30.21 kg/m as calculated from the following:   Height as of this encounter: 5' 4" (1.626 m).   Weight as of this encounter: 79.8 kg.  Code Status: DNR DVT Prophylaxis:  enoxaparin (LOVENOX) injection 40 mg Start: 11/21/21 0800   Level of Care: Level of care: Med-Surg Family Communication: Updated patient   Disposition Plan:      Remains inpatient appropriate: PT evaluation pending, if continues to improve, will hopefully DC tomorrow if meets criteria for home health PT  Procedures:  None  Consultants:   None  Antimicrobials:   Anti-infectives (From admission, onward)    Start     Dose/Rate Route Frequency Ordered Stop   11/21/21 0700  cefTRIAXone (ROCEPHIN) 2 g in sodium chloride 0.9 % 100 mL IVPB        2 g 200 mL/hr over 30 Minutes Intravenous Every 24 hours 11/21/21 0601 11/28/21 0659   11/20/21 1930  doxycycline (VIBRA-TABS) tablet 100 mg        100 mg Oral  Once 11/20/21 1922 11/20/21 1954   11/20/21 0000  doxycycline (VIBRAMYCIN) 50 MG capsule  100 mg Oral 2 times daily 11/20/21 1953 11/27/21 2359          Medications  aspirin EC  81 mg Oral Daily   enoxaparin (LOVENOX) injection  40 mg Subcutaneous Q24H   influenza vaccine adjuvanted  0.5 mL Intramuscular Tomorrow-1000   insulin aspart  0-5 Units Subcutaneous QHS   insulin aspart  0-9 Units Subcutaneous TID WC   pneumococcal 20-valent conjugate vaccine  0.5 mL Intramuscular Tomorrow-1000      Subjective:   Derek Lynch was seen and examined today.  Left lower leg redness and cellulitis improving.  No significant pain.  No fevers or chills.  Patient denies dizziness, chest pain, Abdominal pain, N/V/D/C.   Objective:   Vitals:   11/21/21 0747 11/21/21 1649 11/21/21 2320 11/22/21 0732  BP: 130/85 118/82 (!) 143/82 137/74  Pulse:  90 82 87 77  Resp: _0 Temp: 98.1 F (36.7 C) 98 F (36.7 C) 98.4 F (36.9 C) 98.5 F (36.9 C)  TempSrc:  Oral    SpO2: 96% 97% 98% 97%  Weight:      Height:        Intake/Output Summary (Last 24 hours) at 11/22/2021 0943 Last data filed at 11/22/2021 0735 Gross per 24 hour  Intake 240 ml  Output 1950 ml  Net -1710 ml     Wt Readings from Last 3 Encounters:  11/21/21 79.8 kg  01/24/18 79.2 kg  07/21/17 83.5 kg   Physical Exam General: Alert and oriented x 3, NAD Cardiovascular: S1 S2 clear, RRR.  Respiratory: CTAB, no wheezing Gastrointestinal: Soft, nontender, nondistended, NBS Ext: 1+ pedal edema bilaterally Neuro: no new deficits Skin: L LE cellulitis appears to be improving today Psych: Normal affect and demeanor   Data Reviewed:  I have personally reviewed following labs    CBC Lab Results  Component Value Date   WBC 9.0 11/21/2021   RBC 3.99 (L) 11/21/2021   HGB 12.3 (L) 11/21/2021   HCT 36.2 (L) 11/21/2021   MCV 90.7 11/21/2021   MCH 30.8 11/21/2021   PLT 204 11/21/2021   MCHC 34.0 11/21/2021   RDW 13.2 00/86/7619     Last metabolic panel Lab Results  Component Value Date   NA 136 11/22/2021   K 3.5 11/22/2021   CL 109 11/22/2021   CO2 21 (L) 11/22/2021   BUN 11 11/22/2021   CREATININE 1.02 11/22/2021   GLUCOSE 188 (H) 11/22/2021   GFRNONAA >60 11/22/2021   CALCIUM 8.0 (L) 11/22/2021   PROT 6.0 (L) 11/22/2021   ALBUMIN 2.8 (L) 11/22/2021   BILITOT 0.4 11/22/2021   ALKPHOS 26 (L) 11/22/2021   AST 109 (H) 11/22/2021   ALT 56 (H) 11/22/2021   ANIONGAP 6 11/22/2021    CBG (last 3)  Recent Labs    11/21/21 2044 11/21/21 2134 11/22/21 0733  GLUCAP 193* 232* 198*      Coagulation Profile: Recent Labs  Lab 11/21/21 0734  INR 1.2     Radiology Studies: I have personally reviewed the imaging studies  DG Chest 2 View  Result Date: 11/20/2021 CLINICAL DATA:  Shortness of breath EXAM: CHEST - 2 VIEW COMPARISON:   05/18/2019 report FINDINGS: Cardiomegaly. Streaky and hazy airspace disease at the bases. Suspect small pleural effusions. No pneumothorax. IMPRESSION: 1. Streaky and hazy airspace disease at the bases may be due to atelectasis or pneumonia. Suspect small pleural effusions. 2. Cardiomegaly. Electronically Signed   By: Donavan Foil M.D.   On: 11/20/2021  17:35   CT HEAD WO CONTRAST (5MM)  Result Date: 11/20/2021 CLINICAL DATA:  Multiple falls, most recently this morning EXAM: CT HEAD WITHOUT CONTRAST CT CERVICAL SPINE WITHOUT CONTRAST TECHNIQUE: Multidetector CT imaging of the head and cervical spine was performed following the standard protocol without intravenous contrast. Multiplanar CT image reconstructions of the cervical spine were also generated. RADIATION DOSE REDUCTION: This exam was performed according to the departmental dose-optimization program which includes automated exposure control, adjustment of the mA and/or kV according to patient size and/or use of iterative reconstruction technique. COMPARISON:  None Available. FINDINGS: CT HEAD FINDINGS Brain: Symmetric chronic encephalomalacia the bilateral anterior superior frontal lobes with some involvement of the right anterior inferior frontal lobe. Otherwise, the brainstem, cerebellum, cerebral peduncles, thalamus, basal ganglia, basilar cisterns, and ventricular system appear within normal limits. No intracranial hemorrhage, mass lesion, or acute CVA. Vascular: There is atherosclerotic calcification of the cavernous carotid arteries bilaterally. Skull: Unremarkable Sinuses/Orbits: Chronic bilateral ethmoid sinusitis. Hypoplastic frontal sinuses. Other: No supplemental non-categorized findings. CT CERVICAL SPINE FINDINGS Alignment: No vertebral subluxation is observed. Skull base and vertebrae: Spurring at the anterior C1-2 articulation with some chronically fragmented spur just above the joint on image 38 series 6. No cervical spine fracture or  acute bony findings. Soft tissues and spinal canal: Unremarkable Disc levels: There is osseous foraminal stenosis on the left at C4-5, C5-6, and C6-7; and on the right C3-4, C4-5, C5-6, and C6-7. Potential mild central narrowing of the thecal sac at C5-6 due to posterior osseous ridging. Upper chest: Unremarkable Other: No supplemental non-categorized findings. IMPRESSION: 1. No acute intracranial findings or acute cervical spine findings. 2. 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 3. Chronic bilateral ethmoid sinusitis. 4. 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 5. Potential mild central narrowing of the thecal sac at C5-6 due to posterior osseous ridging. Electronically Signed   By: Van Clines M.D.   On: 11/20/2021 16:52   CT Cervical Spine Wo Contrast  Result Date: 11/20/2021 CLINICAL DATA:  Multiple falls, most recently this morning EXAM: CT HEAD WITHOUT CONTRAST CT CERVICAL SPINE WITHOUT CONTRAST TECHNIQUE: Multidetector CT imaging of the head and cervical spine was performed following the standard protocol without intravenous contrast. Multiplanar CT image reconstructions of the cervical spine were also generated. RADIATION DOSE REDUCTION: This exam was performed according to the departmental dose-optimization program which includes automated exposure control, adjustment of the mA and/or kV according to patient size and/or use of iterative reconstruction technique. COMPARISON:  None Available. FINDINGS: CT HEAD FINDINGS Brain: Symmetric chronic encephalomalacia the bilateral anterior superior frontal lobes with some involvement of the right anterior inferior frontal lobe. Otherwise, the brainstem, cerebellum, cerebral peduncles, thalamus, basal ganglia, basilar cisterns, and ventricular system appear within normal limits. No intracranial hemorrhage, mass lesion, or acute CVA. Vascular: There is atherosclerotic  calcification of the cavernous carotid arteries bilaterally. Skull: Unremarkable Sinuses/Orbits: Chronic bilateral ethmoid sinusitis. Hypoplastic frontal sinuses. Other: No supplemental non-categorized findings. CT CERVICAL SPINE FINDINGS Alignment: No vertebral subluxation is observed. Skull base and vertebrae: Spurring at the anterior C1-2 articulation with some chronically fragmented spur just above the joint on image 38 series 6. No cervical spine fracture or acute bony findings. Soft tissues and spinal canal: Unremarkable Disc levels: There is osseous foraminal stenosis on the left at C4-5, C5-6, and C6-7; and on the right C3-4, C4-5, C5-6, and C6-7. Potential mild central narrowing of the thecal sac at C5-6 due to posterior osseous ridging. Upper  chest: Unremarkable Other: No supplemental non-categorized findings. IMPRESSION: 1. No acute intracranial findings or acute cervical spine findings. 2. 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 3. Chronic bilateral ethmoid sinusitis. 4. 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 5. Potential mild central narrowing of the thecal sac at C5-6 due to posterior osseous ridging. Electronically Signed   By: Van Clines M.D.   On: 11/20/2021 16:52       Shirell Struthers M.D. [facility] 11/22/2021, 9:43 AM  Available via Epic secure chat 7am-7pm After 7 pm, please refer to night coverage provider listed on amion.

## 2021-11-22 NOTE — Evaluation (Signed)
Physical Therapy Evaluation Patient Details Name: Derek Lynch MRN: [id number] DOB: 1940-12-27 Today's Date: 11/22/2021  History of Present Illness  Pt is a 81 y/o M admitted on 11/20/21 after presenting with acute onset of fall after being in recliner for ~10 hours & finally getting up. Pt is being treated for rhabdomyolysis & sepsis 2/2 LLE cellulits. PMH: OA, dyslipidemia  Clinical Impression  Pt seen for PT evaluation with pt received in bed. Pt is extremely HOH & not the most reliable historian. It appears pt lives alone, receives meals on wheels, sleeps in his recliner (lift recliner) and when it stopped working he could not transfer out of it. Upon PT removing covers to initiate bed mobility pt was noted to be saturated in partially liquid BM. Pt reports he was aware of it but unable to state why he did not call for assistance. Pt rolled L<>R with min assist & cuing for technique to allow PT & nursing staff to perform peri hygiene. Afterwards, pt assisted to sitting EOB with mod assist & hospital bed features. Pt is able to complete STS & step pivot with mod assist +2 from elevated EOB. Pt presents with BLE weakness & decreased balance & activity tolerance. Pt is unsafe to d/c home alone and would benefit from STR upon d/c. Will continue to follow pt acutely to address strengthening, balance, activity tolerance, and gait with LRAD.   Recommendations for follow up therapy are one component of a multi-disciplinary discharge planning process, led by the attending physician.  Recommendations may be updated based on patient status, additional functional criteria and insurance authorization.  Follow Up Recommendations Skilled nursing-short term rehab (<3 hours/day) Can patient physically be transported by private vehicle: No    Assistance Recommended at Discharge Frequent or constant Supervision/Assistance  Patient can return home with the following  A lot of help with walking and/or transfers;A  lot of help with bathing/dressing/bathroom;Assist for transportation;Assistance with cooking/housework;Direct supervision/assist for financial management;Help with stairs or ramp for entrance    Equipment Recommendations  (TBD in next venue)  Recommendations for Other Services       Functional Status Assessment Patient has had a recent decline in their functional status and demonstrates the ability to make significant improvements in function in a reasonable and predictable amount of time.     Precautions / Restrictions Precautions Precautions: Fall Restrictions Weight Bearing Restrictions: No      Mobility  Bed Mobility Overal bed mobility: Needs Assistance Bed Mobility: Rolling, Sidelying to Sit Rolling: Min assist (max cuing for use of bed rails) Sidelying to sit: Mod assist, HOB elevated (Pt is able to transition BLE off EOB, assistance to upright trunk.)            Transfers Overall transfer level: Needs assistance Equipment used: Rolling walker (2 wheels) Transfers: Sit to/from Stand, Bed to chair/wheelchair/BSC Sit to Stand: Mod assist, +2 physical assistance   Step pivot transfers: Mod assist, +2 physical assistance       General transfer comment: cuing for safe hand placement, assistance to power up, extra time to weight shift L<>R to take steps to recliner, cuing to reach back for recliner for stand>sit    Ambulation/Gait                  Stairs            Wheelchair Mobility    Modified Rankin (Stroke Patients Only)       Balance Overall balance assessment: Needs assistance Sitting-balance  support: Feet supported, Bilateral upper extremity supported Sitting balance-Leahy Scale: Poor Sitting balance - Comments: min assist static sitting Postural control: Posterior lean Standing balance support: During functional activity, Bilateral upper extremity supported, Reliant on assistive device for balance Standing balance-Leahy Scale: Poor                                Pertinent Vitals/Pain Pain Assessment Pain Assessment: Faces Faces Pain Scale: Hurts even more Pain Location: BUE with rolling in bed Pain Descriptors / Indicators: Discomfort, Guarding, Grimacing Pain Intervention(s): Repositioned    Home Living Family/patient expects to be discharged to:: Private residence Living Arrangements: Alone   Type of Home: Apartment Home Access: Level entry       Home Layout: One level   Additional Comments: Pt is not a clear historian. Reports he has a chair that is electric that he sits in, but declines using a lift chair. Pt reports he lives alone, receives meals on wheels.    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 Pt found extremely saturated in partially liquid BM with pt reporting he was aware of it & unable to state why he didn't call for assistance. Poor recall/short term memory as pt requires ongoing cuing throughout session re: proper use of call bell. Decreased safety awareness.        General Comments      Exercises     Assessment/Plan    PT Assessment Patient needs continued PT services  PT Problem List Decreased strength;Decreased coordination;Decreased range of  motion;Decreased activity tolerance;Decreased balance;Decreased mobility;Decreased knowledge of precautions;Decreased safety awareness;Decreased knowledge of use of DME       PT Treatment Interventions DME instruction;Therapeutic exercise;Gait training;Stair training;Neuromuscular re-education;Balance training;Functional mobility training;Therapeutic activities;Patient/family education    PT Goals (Current goals can be found in the Care Plan section)  Acute Rehab PT Goals Patient Stated Goal: none stated PT Goal Formulation: With patient Time For Goal Achievement: 12/06/21 Potential to Achieve Goals: Fair    Frequency Min 2X/week     Co-evaluation               AM-PAC PT "6 Clicks" Mobility  Outcome Measure Help needed turning from your back to your side while in a flat bed without using bedrails?: A Little Help needed moving from lying on your back to sitting on the side of a flat bed without using bedrails?: Total Help needed moving to and from a bed to a chair (including a wheelchair)?: Total Help needed standing up from a chair using your arms (e.g., wheelchair or bedside  chair)?: A Lot Help needed to walk in hospital room?: Total Help needed climbing 3-5 steps with a railing? : Total 6 Click Score: 9    End of Session   Activity Tolerance: Patient tolerated treatment well Patient left: in chair;with chair alarm set;with call bell/phone within reach;with nursing/sitter in room Nurse Communication: Mobility status PT Visit Diagnosis: Difficulty in walking, not elsewhere classified (R26.2);Muscle weakness (generalized) (M62.81);Unsteadiness on feet (R26.81)    Time: 3794-3276 PT Time Calculation (min) (ACUTE ONLY): 29 min   Charges:   PT Evaluation $PT Eval Moderate Complexity: 1 Mod PT Treatments $Therapeutic Activity: 8-22 mins        Lavone Nian, PT, DPT 11/22/21, 4:21 PM   Waunita Schooner 11/22/2021, 4:19 PM

## 2021-11-23 LAB — COMPREHENSIVE METABOLIC PANEL
ALT: 63 U/L — ABNORMAL HIGH (ref 0–44)
AST: 89 U/L — ABNORMAL HIGH (ref 15–41)
Albumin: 3.1 g/dL — ABNORMAL LOW (ref 3.5–5.0)
Alkaline Phosphatase: 35 U/L — ABNORMAL LOW (ref 38–126)
Anion gap: 7 (ref 5–15)
BUN: 9 mg/dL (ref 8–23)
CO2: 24 mmol/L (ref 22–32)
Calcium: 8.6 mg/dL — ABNORMAL LOW (ref 8.9–10.3)
Chloride: 109 mmol/L (ref 98–111)
Creatinine, Ser: 0.92 mg/dL (ref 0.61–1.24)
GFR, Estimated: >60 mL/min (ref >60)
Glucose, Bld: 155 mg/dL — ABNORMAL HIGH (ref 70–99)
Potassium: 4 mmol/L (ref 3.5–5.1)
Sodium: 140 mmol/L (ref 135–145)
Total Bilirubin: 0.5 mg/dL (ref 0.3–1.2)
Total Protein: 6.8 g/dL (ref 6.5–8.1)

## 2021-11-23 LAB — GLUCOSE, CAPILLARY
Glucose-Capillary: 136 mg/dL — ABNORMAL HIGH (ref 70–99)
Glucose-Capillary: 159 mg/dL — ABNORMAL HIGH (ref 70–99)
Glucose-Capillary: 150 mg/dL — ABNORMAL HIGH (ref 70–99)
Glucose-Capillary: 163 mg/dL — ABNORMAL HIGH (ref 70–99)

## 2021-11-23 LAB — CK: Total CK: 1148 U/L — ABNORMAL HIGH (ref 49–397)

## 2021-11-23 NOTE — Progress Notes (Signed)
Derek Lynch, is a 81 y.o. male, DOB - 1940/09/14, [id number] Admit date - 11/20/2021    Outpatient Primary MD for the patient is [name], [place]  LOS - 3  days  Chief Complaint  Patient presents with   Fall       Brief summary   Patient is a 81 year old male with osteoarthritis, dyslipidemia, presented with acute onset of fall after being stuck in his recliner for about 10 hours and finally getting up.  He denied any presyncope or syncope or paresthesias or focal muscle weakness.  He reported recent cough without wheezing or dyspnea.  Patient had left lower extremity erythema with mild tenderness. In ED, BP 141/81 with heart rate of 104. Creatinine 1.43, CK 10,147, leukocytosis 12.9 CT head showed no acute intracranial abnormality, showed chronic bilateral ethmoid sinusitis, chronic encephalomalacia CT of the C-spine revealed cervical spondylosis with degenerative disc disease causing multilevel osseous foraminal impingement. Chest x-ray showed cardiomegaly, streaky and hazy airspace disease at the bases that may be due to atelectasis or pneumonia.   Assessment & Plan    Principal Problem:   Rhabdomyolysis, acute -Traumatic rhabdomyolysis due to mechanical fall -CK 10,147 on admission-> improved to 1148 today -Renal function normalized, creatinine 0.92 -Will DC IV fluids   Active Problems:   Sepsis due to cellulitis ([place]), left lower extremity -Met sepsis criteria due to leukocytosis, tachycardia, source likely due to cellulitis -Sepsis physiology resolved -Cellulitis improving, continue IV Rocephin    Dyslipidemia -Hold off statin due to rhabdomyolysis  Mild transaminitis -Likely due to acute rhabdomyolysis, improving    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 ([place]) - hold off oral hypoglycemics -Hemoglobin A1c 6.4 -Continue sliding scale  insulin Recent Labs    11/22/21 0733 11/22/21 1228 11/22/21 1729 11/22/21 2030 11/23/21 0824 11/23/21 1126  GLUCAP 198* 157* 170* 138* 136* 159*     Acute kidney injury -Likely due to acute rhabdomyolysis, creatinine 1.43 on admission -KVO IV fluids, creatinine improved    Fall with generalized debility -PT eval recommending SNF  Obesity Estimated body mass index is 30.21 kg/m as calculated from the following:   Height as of this encounter: _0  (1.626 m).   Weight as of this encounter: 79.8 kg.  Code Status: DNR DVT Prophylaxis:  enoxaparin (LOVENOX) injection 40 mg Start: 11/21/21 0800   Level of Care: Level of care: Med-Surg Family Communication: Updated patient's friend, Tresa Endo. She mentioned that patient has be receiving home health PT before coming to the hospital.      Disposition Plan:      Remains inpatient appropriate: PT evaluation recommending SNF, TOC consulted   Procedures:  None  Consultants:   None  Antimicrobials:   Anti-infectives (From admission, onward)    Start     Dose/Rate Route Frequency Ordered Stop   11/21/21 0700  cefTRIAXone (ROCEPHIN) 2 g in sodium chloride 0.9 % 100 mL IVPB        2 g 200 mL/hr over 30 Minutes  Intravenous Every 24 hours 11/21/21 0601 11/28/21 0659   11/20/21 1930  doxycycline (VIBRA-TABS) tablet 100 mg        100 mg Oral  Once 11/20/21 1922 11/20/21 1954   11/20/21 0000  doxycycline (VIBRAMYCIN) 50 MG capsule        100 mg Oral 2 times daily 11/20/21 1953 11/27/21 2359          Medications  aspirin EC  81 mg Oral Daily   enoxaparin (LOVENOX) injection  40 mg Subcutaneous Q24H   insulin aspart  0-5 Units Subcutaneous QHS   insulin aspart  0-9 Units Subcutaneous TID WC      Subjective:   Derek Lynch was seen and examined today.  Left lower leg cellulitis improving, no significant pain, fevers or chills.  No acute issues overnight.  Objective:   Vitals:   11/22/21 0732 11/22/21 1727 11/23/21  0030 11/23/21 0850  BP: 137/74 (!) 146/88 138/89 122/70  Pulse: 77 78 75 64  Resp: _0 Temp: 98.5 F (36.9 C) 98.2 F (36.8 C) 97.6 F (36.4 C) 98.7 F (37.1 C)  TempSrc:      SpO2: 97% 98% 98% 99%  Weight:      Height:        Intake/Output Summary (Last 24 hours) at 11/23/2021 1223 Last data filed at 11/23/2021 0845 Gross per 24 hour  Intake 3514.38 ml  Output 1400 ml  Net 2114.38 ml     Wt Readings from Last 3 Encounters:  11/21/21 79.8 kg  01/24/18 79.2 kg  07/21/17 83.5 kg    Physical Exam General: Alert and oriented x 3, NAD Cardiovascular: S1 S2 clear, RRR.  Respiratory: CTAB Gastrointestinal: Soft, nontender, nondistended, NBS Ext: no pedal edema bilaterally Neuro: no new deficits Skin: LLE cellulitis appears to be improving Psych: Normal affect   Data Reviewed:  I have personally reviewed following labs    CBC Lab Results  Component Value Date   WBC 9.0 11/21/2021   RBC 3.99 (L) 11/21/2021   HGB 12.3 (L) 11/21/2021   HCT 36.2 (L) 11/21/2021   MCV 90.7 11/21/2021   MCH 30.8 11/21/2021   PLT 204 11/21/2021   MCHC 34.0 11/21/2021   RDW 13.2 35/70/1779     Last metabolic panel Lab Results  Component Value Date   NA 140 11/23/2021   K 4.0 11/23/2021   CL 109 11/23/2021   CO2 24 11/23/2021   BUN 9 11/23/2021   CREATININE 0.92 11/23/2021   GLUCOSE 155 (H) 11/23/2021   GFRNONAA >60 11/23/2021   CALCIUM 8.6 (L) 11/23/2021   PROT 6.8 11/23/2021   ALBUMIN 3.1 (L) 11/23/2021   BILITOT 0.5 11/23/2021   ALKPHOS 35 (L) 11/23/2021   AST 89 (H) 11/23/2021   ALT 63 (H) 11/23/2021   ANIONGAP 7 11/23/2021    CBG (last 3)  Recent Labs    11/22/21 2030 11/23/21 0824 11/23/21 1126  GLUCAP 138* 136* 159*      Coagulation Profile: Recent Labs  Lab 11/21/21 0734  INR 1.2     Radiology Studies: I have personally reviewed the imaging studies  No results found.     Estill Cotta M.D. [facility] 11/23/2021, 12:23  PM  Available via Epic secure chat 7am-7pm After 7 pm, please refer to night coverage provider listed on amion.

## 2021-11-23 NOTE — Evaluation (Addendum)
Occupational Therapy Evaluation Patient Details Name: Derek Lynch MRN: [id number] DOB: 06/02/1940 Today's Date: 11/23/2021   History of Present Illness Pt is a 81 year old male presenting after fall, admitted with traumatic rhabdo due to fall, Sepsis due to cellulitis ([place]), left lower extremity. PMH significant for  osteoarthritis, dyslipidemia   Clinical Impression   Chart reviewed, pt greeted in room agreeable to OT evaluation. Pt is alert, oriented to self, place, situation not oriented dto time. Pt with poor awareness of deficits, requires increased time for processing/verbal and tactile cues. Pt found soild in bed, reports he knew he had had a BM, did not call staff unable to report why. PTA pt reports he had "people" come to his house to help him clean himself up, unable to provide information on who/for how long. Pt endorses amb with RW to the bathroom, using his electric chair that he prefers to sleep in. Pt presents with deficits in strength, endurance, activity tolerance, balance, all affecting safe and optimal ADL completion. Recommend discharge to STR to address functional deficits and to facilitate return to PLOF. Pt is left in bedside chair, all needs met. OT will continue to follow acutely.      Recommendations for follow up therapy are one component of a multi-disciplinary discharge planning process, led by the attending physician.  Recommendations may be updated based on patient status, additional functional criteria and insurance authorization.   Follow Up Recommendations  Skilled nursing-short term rehab (<3 hours/day)    Assistance Recommended at Discharge Frequent or constant Supervision/Assistance  Patient can return home with the following A lot of help with walking and/or transfers;A lot of help with bathing/dressing/bathroom    Functional Status Assessment  Patient has had a recent decline in their functional status and demonstrates the ability to make significant  improvements in function in a reasonable and predictable amount of time.  Equipment Recommendations  Other (comment) (per next venue of care)    Recommendations for Other Services       Precautions / Restrictions Precautions Precautions: Fall Restrictions Weight Bearing Restrictions: No      Mobility Bed Mobility Overal bed mobility: Needs Assistance Bed Mobility: Supine to Sit   Sidelying to sit: Min assist, HOB elevated, Mod assist            Transfers Overall transfer level: Needs assistance Equipment used: Rolling walker (2 wheels) Transfers: Sit to/from Stand Sit to Stand: Mod assist           General transfer comment: pt able to stand for approx 3 minutes for peri care with RW      Balance Overall balance assessment: Needs assistance Sitting-balance support: Feet supported, Bilateral upper extremity supported Sitting balance-Leahy Scale: Fair     Standing balance support: During functional activity, Bilateral upper extremity supported, Reliant on assistive device for balance Standing balance-Leahy Scale: Poor                             ADL either performed or assessed with clinical judgement   ADL Overall ADL's : Needs assistance/impaired Eating/Feeding: Set up;Sitting   Grooming: Wash/dry hands;Sitting;Set up       Lower Body Bathing: Maximal assistance   Upper Body Dressing : Moderate assistance;Sitting;Cueing for sequencing   Lower Body Dressing: Maximal assistance;Sitting/lateral leans;Cueing for sequencing   Toilet Transfer: Minimal assistance;Moderate assistance;Rolling walker (2 wheels) Toilet Transfer Details (indicate cue type and reason): simulated to bedside chair Toileting- Clothing  Manipulation and Hygiene: Maximal assistance;Sit to/from stand Toileting - Clothing Manipulation Details (indicate cue type and reason): following BM- pt reports he felt he had a BM but did not call for help, unable to state why              Vision Baseline Vision/History: 1 Wears glasses Patient Visual Report: No change from baseline Additional Comments: will continue to asesss, pt endorses no vision changes     Perception     Praxis      Pertinent Vitals/Pain Pain Assessment Pain Assessment: Faces Faces Pain Scale: Hurts a little bit Pain Location: RLE Pain Descriptors / Indicators: Grimacing Pain Intervention(s): Limited activity within patient's tolerance, Monitored during session     Hand Dominance     Extremity/Trunk Assessment Upper Extremity Assessment Upper Extremity Assessment: Generalized weakness   Lower Extremity Assessment Lower Extremity Assessment: Generalized weakness       Communication Communication Communication: HOH   Cognition Arousal/Alertness: Awake/alert Behavior During Therapy: WFL for tasks assessed/performed Overall Cognitive Status: No family/caregiver present to determine baseline cognitive functioning Area of Impairment: Orientation, Attention, Memory, Following commands, Safety/judgement, Awareness, Problem solving                 Orientation Level: Disoriented to, Time Current Attention Level: Focused Memory: Decreased short-term memory Following Commands: Follows one step commands with increased time Safety/Judgement: Decreased awareness of safety, Decreased awareness of deficits Awareness: Intellectual Problem Solving: Slow processing, Decreased initiation, Difficulty sequencing, Requires verbal cues, Requires tactile cues       General Comments  redness noted on sacrum, NT present and notified, vital signs monitored and stable throughout     Exercises     Shoulder Instructions      Home Living Family/patient expects to be discharged to:: Private residence Living Arrangements: Alone Available Help at Discharge: Other (Comment) (pt reports aids come in to help, unable to provide when/how many hours) Type of Home: Apartment Home Access: Level  entry     Home Layout: One level                   Additional Comments: pt reports he amb with a "walker" unable to specify which kind of walker, but does report he walks to the bathroom, gets meals on wheels, uses an electric chair that he stays in/sleeps in      Prior Functioning/Environment Prior Level of Function : Patient poor historian/Family not available               ADLs Comments: pt endorses he will have accidents sometimes and requires assist from "those people" that come in and help him; anticipate needing assist for all IADL        OT Problem List: Decreased strength;Decreased activity tolerance;Decreased knowledge of use of DME or AE;Decreased safety awareness;Impaired balance (sitting and/or standing);Decreased cognition      OT Treatment/Interventions: Self-care/ADL training;Patient/family education;Therapeutic exercise;Balance training;Therapeutic activities;DME and/or AE instruction    OT Goals(Current goals can be found in the care plan section) Acute Rehab OT Goals Patient Stated Goal: get stronger OT Goal Formulation: With patient Time For Goal Achievement: 12/07/21 Potential to Achieve Goals: Good ADL Goals Pt Will Perform Grooming: with supervision;sitting Pt Will Perform Lower Body Dressing: with min assist Pt Will Transfer to Toilet: with supervision Pt Will Perform Toileting - Clothing Manipulation and hygiene: with supervision  OT Frequency: Min 2X/week    Co-evaluation              AM-PAC  OT "6 Clicks" Daily Activity     Outcome Measure Help from another person eating meals?: None Help from another person taking care of personal grooming?: A Little Help from another person toileting, which includes using toliet, bedpan, or urinal?: A Lot Help from another person bathing (including washing, rinsing, drying)?: A Lot Help from another person to put on and taking off regular upper body clothing?: A Little Help from another person  to put on and taking off regular lower body clothing?: A Lot 6 Click Score: 16   End of Session Equipment Utilized During Treatment: Rolling walker (2 wheels) Nurse Communication: Mobility status  Activity Tolerance: Patient tolerated treatment well Patient left: in chair;with call bell/phone within reach;with chair alarm set  OT Visit Diagnosis: Unsteadiness on feet (R26.81);Muscle weakness (generalized) (M62.81);Other abnormalities of gait and mobility (R26.89)                Time: 8473-0856 OT Time Calculation (min): 14 min Charges:  OT General Charges $OT Visit: 1 Visit OT Evaluation $OT Eval Moderate Complexity: 1 Mod  Shanon Payor, OTD OTR/L  11/23/21, 11:19 AM

## 2021-11-23 NOTE — Plan of Care (Signed)
  Problem: Elimination: Goal: Will not experience complications related to bowel motility Outcome: Progressing   Problem: Coping: Goal: Level of anxiety will decrease Outcome: Progressing   Problem: Pain Managment: Goal: General experience of comfort will improve Outcome: Progressing   Problem: Skin Integrity: Goal: Risk for impaired skin integrity will decrease Outcome: Progressing   Problem: Nutritional: Goal: Maintenance of adequate nutrition will improve Outcome: Progressing   Problem: Skin Integrity: Goal: Risk for impaired skin integrity will decrease Outcome: Progressing

## 2021-11-24 DIAGNOSIS — R918 Other nonspecific abnormal finding of lung field: Secondary | ICD-10-CM

## 2021-11-24 LAB — COMPREHENSIVE METABOLIC PANEL
ALT: 54 U/L — ABNORMAL HIGH (ref 0–44)
AST: 58 U/L — ABNORMAL HIGH (ref 15–41)
Albumin: 2.9 g/dL — ABNORMAL LOW (ref 3.5–5.0)
Alkaline Phosphatase: 32 U/L — ABNORMAL LOW (ref 38–126)
Anion gap: 7 (ref 5–15)
BUN: 13 mg/dL (ref 8–23)
CO2: 22 mmol/L (ref 22–32)
Calcium: 8.7 mg/dL — ABNORMAL LOW (ref 8.9–10.3)
Chloride: 109 mmol/L (ref 98–111)
Creatinine, Ser: 0.99 mg/dL (ref 0.61–1.24)
GFR, Estimated: >60 mL/min (ref >60)
Glucose, Bld: 147 mg/dL — ABNORMAL HIGH (ref 70–99)
Potassium: 3.9 mmol/L (ref 3.5–5.1)
Sodium: 138 mmol/L (ref 135–145)
Total Bilirubin: 0.6 mg/dL (ref 0.3–1.2)
Total Protein: 6.4 g/dL — ABNORMAL LOW (ref 6.5–8.1)

## 2021-11-24 LAB — GLUCOSE, CAPILLARY
Glucose-Capillary: 143 mg/dL — ABNORMAL HIGH (ref 70–99)
Glucose-Capillary: 184 mg/dL — ABNORMAL HIGH (ref 70–99)
Glucose-Capillary: 130 mg/dL — ABNORMAL HIGH (ref 70–99)

## 2021-11-24 LAB — CK: Total CK: 794 U/L — ABNORMAL HIGH (ref 49–397)

## 2021-11-24 NOTE — Plan of Care (Signed)

## 2021-11-24 NOTE — Progress Notes (Signed)
Derek Lynch, is a 81 y.o. male, DOB - 1940/12/15, [id number] Admit date - 11/20/2021    Outpatient Primary MD for the patient is [name], [name]  LOS - 4  days  Chief Complaint  Patient presents with   Fall       Brief summary   Patient is a 81 year old male with osteoarthritis, dyslipidemia, presented with acute onset of fall after being stuck in his recliner for about 10 hours and finally getting up.  He denied any presyncope or syncope or paresthesias or focal muscle weakness.  He reported recent cough without wheezing or dyspnea.  Patient had left lower extremity erythema with mild tenderness. In ED, BP 141/81 with heart rate of 104. Creatinine 1.43, CK 10,147, leukocytosis 12.9 CT head showed no acute intracranial abnormality, showed chronic bilateral ethmoid sinusitis, chronic encephalomalacia CT of the C-spine revealed cervical spondylosis with degenerative disc disease causing multilevel osseous foraminal impingement. Chest x-ray showed cardiomegaly, streaky and hazy airspace disease at the bases that may be due to atelectasis or pneumonia.   Assessment & Plan    Principal Problem:   Rhabdomyolysis, acute -Traumatic rhabdomyolysis due to mechanical fall -CK 10,147 on admission-> improved to 794 -Cr 0.9 -Discontinued IVF   Active Problems:   Sepsis due to cellulitis ([place]), left lower extremity -Met sepsis criteria due to leukocytosis, tachycardia, source likely due to cellulitis -Sepsis physiology resolved -Cellulitis improving     Dyslipidemia -Hold off statin due to rhabdomyolysis  Mild transaminitis -Likely due to acute rhabdomyolysis, improving    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 ([place]) - hold off oral hypoglycemics -Hemoglobin A1c 6.4 -Continue sliding scale insulin Recent Labs    11/23/21 0824 11/23/21 1126  11/23/21 1644 11/23/21 2057 11/24/21 0744 11/24/21 1208  GLUCAP 136* 159* 150* 163* 143* 184*     Acute kidney injury -Likely due to acute rhabdomyolysis, creatinine 1.43 on admission -KVO IV fluids, creatinine improved    Fall with generalized debility -PT eval recommending SNF  Obesity Estimated body mass index is 30.21 kg/m as calculated from the following:   Height as of this encounter: _0  (1.626 m).   Weight as of this encounter: 79.8 kg.  Code Status: DNR DVT Prophylaxis:  enoxaparin (LOVENOX) injection 40 mg Start: 11/21/21 0800   Level of Care: Level of care: Med-Surg Family Communication: Updated patient's friend, Tresa Endo on 11/5. She mentioned that patient has be receiving home health PT before coming to the hospital.      Disposition Plan:      Remains inpatient appropriate: PT evaluation recommending SNF, TOC consulted.  Patient medically stable, discharge when bed available   Procedures:  None  Consultants:   None  Antimicrobials:   Anti-infectives (From admission, onward)    Start     Dose/Rate Route Frequency Ordered Stop   11/21/21 0700  cefTRIAXone (ROCEPHIN) 2 g in sodium chloride 0.9 % 100 mL IVPB        2 g 200 mL/hr over  30 Minutes Intravenous Every 24 hours 11/21/21 0601 11/28/21 0659   11/20/21 1930  doxycycline (VIBRA-TABS) tablet 100 mg        100 mg Oral  Once 11/20/21 1922 11/20/21 1954   11/20/21 0000  doxycycline (VIBRAMYCIN) 50 MG capsule        100 mg Oral 2 times daily 11/20/21 1953 11/27/21 2359          Medications  aspirin EC  81 mg Oral Daily   enoxaparin (LOVENOX) injection  40 mg Subcutaneous Q24H   insulin aspart  0-5 Units Subcutaneous QHS   insulin aspart  0-9 Units Subcutaneous TID WC      Subjective:   Derek Lynch was seen and examined today.  No acute complaints.  Left lower extremity cellulitis improving.  No significant pain.  No fevers    Objective:   Vitals:   11/23/21 0850 11/23/21 1449  11/24/21 0015 11/24/21 0801  BP: 122/70 123/66 125/71 (!) 125/92  Pulse: 64 66 72 71  Resp: _0 Temp: 98.7 F (37.1 C) 99.3 F (37.4 C) 98.8 F (37.1 C) (!) 97.2 F (36.2 C)  TempSrc:   Oral   SpO2: 99% 98% 95% 94%  Weight:      Height:        Intake/Output Summary (Last 24 hours) at 11/24/2021 1302 Last data filed at 11/24/2021 0753 Gross per 24 hour  Intake 2152.61 ml  Output 200 ml  Net 1952.61 ml     Wt Readings from Last 3 Encounters:  11/21/21 79.8 kg  01/24/18 79.2 kg  07/21/17 83.5 kg   Physical Exam General: Alert and oriented x 3, NAD Cardiovascular: S1 S2 clear, RRR.  Respiratory: CTAB, no wheezing Gastrointestinal: Soft, nontender, nondistended, NBS Ext: no pedal edema bilaterally Neuro: no new deficits Skin: L LE cellulitis improving Psych: Normal affect   Data Reviewed:  I have personally reviewed following labs    CBC Lab Results  Component Value Date   WBC 9.0 11/21/2021   RBC 3.99 (L) 11/21/2021   HGB 12.3 (L) 11/21/2021   HCT 36.2 (L) 11/21/2021   MCV 90.7 11/21/2021   MCH 30.8 11/21/2021   PLT 204 11/21/2021   MCHC 34.0 11/21/2021   RDW 13.2 53/74/8270     Last metabolic panel Lab Results  Component Value Date   NA 138 11/24/2021   K 3.9 11/24/2021   CL 109 11/24/2021   CO2 22 11/24/2021   BUN 13 11/24/2021   CREATININE 0.99 11/24/2021   GLUCOSE 147 (H) 11/24/2021   GFRNONAA >60 11/24/2021   CALCIUM 8.7 (L) 11/24/2021   PROT 6.4 (L) 11/24/2021   ALBUMIN 2.9 (L) 11/24/2021   BILITOT 0.6 11/24/2021   ALKPHOS 32 (L) 11/24/2021   AST 58 (H) 11/24/2021   ALT 54 (H) 11/24/2021   ANIONGAP 7 11/24/2021    CBG (last 3)  Recent Labs    11/23/21 2057 11/24/21 0744 11/24/21 1208  GLUCAP 163* 143* 184*      Coagulation Profile: Recent Labs  Lab 11/21/21 0734  INR 1.2     Radiology Studies: I have personally reviewed the imaging studies  No results found.     Estill Cotta M.D. [facility] 11/24/2021, 1:02 PM  Available via Epic secure chat 7am-7pm After 7 pm, please refer to night coverage provider listed on amion.

## 2021-11-24 NOTE — Progress Notes (Signed)
Occupational Therapy Treatment Patient Details Name: Derek Lynch MRN: [id number] DOB: 03-20-1940 Today's Date: 11/24/2021   History of present illness Pt is a 81 year old male presenting after fall, admitted with traumatic rhabdo due to fall, Sepsis due to cellulitis (Derek Lynch), left lower extremity. PMH significant for  osteoarthritis, dyslipidemia   OT comments  Mr. Derek Lynch continues to present with generalized weakness and impaired cognition that impacts his safety and independence in ADLs.  Patient was agreeable to OT treatment, though declined OOB mobility due to reports of recently ambulating with nursing assist.  OT provided setup assist for patient to perform grooming tasks while in supported sitting (in recliner).  Patient unable to reach B feet to don/doff socks, with OT providing total assist.  OT provided education re: compensatory strategies to don/doff socks including figure 4 position.  OT also provided education re: strategies to promote orientation and cognition while in hospital setting.  Patient was receptive to all education, though continues to present with flat affect and short term memory deficits.  He will continue to benefit from skilled OT services in acute setting to support functional strengthening and cognition, as well as safety and independence in ADLs.  Discharge recommendations remain appropriate.   Recommendations for follow up therapy are one component of a multi-disciplinary discharge planning process, led by the attending physician.  Recommendations may be updated based on patient status, additional functional criteria and insurance authorization.    Follow Up Recommendations  Skilled nursing-short term rehab (<3 hours/day)    Assistance Recommended at Discharge Frequent or constant Supervision/Assistance  Patient can return home with the following  A lot of help with walking and/or transfers;A lot of help with bathing/dressing/bathroom   Equipment Recommendations   None recommended by OT    Recommendations for Other Services      Precautions / Restrictions Precautions Precautions: Fall Restrictions Weight Bearing Restrictions: No       Mobility Bed Mobility Overal bed mobility: Needs Assistance               Patient Response: Flat affect  Transfers Overall transfer level: Needs assistance Equipment used: Rolling walker (2 wheels)                     Balance Overall balance assessment: Needs assistance                                         ADL either performed or assessed with clinical judgement   ADL Overall ADL's : Needs assistance/impaired Eating/Feeding: Set up;Sitting   Grooming: Wash/dry hands;Sitting;Set up;Wash/dry face               Lower Body Dressing: Maximal assistance;Sitting/lateral leans;Cueing for sequencing Lower Body Dressing Details (indicate cue type and reason): Required total assist for donning/doffing socks, anticipate able to assist more with other lower body dressing tasks                    Extremity/Trunk Assessment Upper Extremity Assessment Upper Extremity Assessment: Generalized weakness   Lower Extremity Assessment Lower Extremity Assessment: Generalized weakness        Vision Baseline Vision/History: 1 Wears glasses Patient Visual Report: No change from baseline     Perception     Praxis      Cognition Arousal/Alertness: Awake/alert Behavior During Therapy: WFL for tasks assessed/performed Overall Cognitive Status: No family/caregiver present  to determine baseline cognitive functioning Area of Impairment: Orientation, Following commands                 Orientation Level: Disoriented to, Time     Following Commands: Follows one step commands with increased time     Problem Solving: Slow processing, Decreased initiation, Difficulty sequencing, Requires verbal cues, Requires tactile cues General Comments: Some confusion noted  about situation and home setup, able to perform one-step commands with increased time        Exercises Other Exercises Other Exercises: provided education re: benefits of OOB activity, assist for self care    Shoulder Instructions       General Comments      Pertinent Vitals/ Pain       Pain Assessment Pain Assessment: No/denies pain  Home Living                                          Prior Functioning/Environment              Frequency  Min 2X/week        Progress Toward Goals  OT Goals(current goals can now be found in the care plan section)  Progress towards OT goals: Progressing toward goals  Acute Rehab OT Goals Patient Stated Goal: get stronger OT Goal Formulation: With patient Time For Goal Achievement: 12/07/21 Potential to Achieve Goals: Good  Plan Discharge plan remains appropriate;Frequency remains appropriate    Co-evaluation                 AM-PAC OT "6 Clicks" Daily Activity     Outcome Measure   Help from another person eating meals?: None Help from another person taking care of personal grooming?: A Little Help from another person toileting, which includes using toliet, bedpan, or urinal?: A Lot Help from another person bathing (including washing, rinsing, drying)?: A Lot Help from another person to put on and taking off regular upper body clothing?: A Little Help from another person to put on and taking off regular lower body clothing?: A Lot 6 Click Score: 16    End of Session    OT Visit Diagnosis: Unsteadiness on feet (R26.81);Muscle weakness (generalized) (M62.81);Other abnormalities of gait and mobility (R26.89);Other symptoms and signs involving cognitive function   Activity Tolerance Patient limited by fatigue   Patient Left in chair;with call bell/phone within reach;with chair alarm set   Nurse Communication          Time: 0539-7673 OT Time Calculation (min): 11 min  Charges: OT General  Charges $OT Visit: 1 Visit OT Treatments $Self Care/Home Management : 8-22 mins  Derek Lynch, OTR/L 11/24/21, 11:04 AM

## 2021-11-24 NOTE — [place] FL2 (Signed)
MEDICAID FL2 LEVEL OF CARE SCREENING TOOL     IDENTIFICATION  Patient Name: Derek Lynch Birthdate: [birth year] Sex: male Admission Date (Current Location): 11/20/2021  [facility] and [place] Number:  Chiropodist and Address:  [facility], [address], [place], [place] [address]      Provider Number: [id number]  Attending Physician Name and Address:  Cathren Harsh, MD  Relative Name and Phone Number:  [name], caregiver [phone]    Current Level of Care: Hospital Recommended Level of Care: Skilled Nursing Facility Prior Approval Number:    Date Approved/Denied:   PASRR Number: [id number]  Discharge Plan: SNF    Current Diagnoses: Patient Active Problem List   Diagnosis Date Noted   Sepsis due to cellulitis (HCC) 11/21/2021   Dyslipidemia 11/21/2021   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 (HCC) 11/21/2021   Fall 11/21/2021   Rhabdomyolysis 11/20/2021   Chronic venous insufficiency 04/14/2017   Lymphedema 04/14/2017   Pain in both lower extremities 03/17/2017   Hyperlipidemia 03/17/2017    Orientation RESPIRATION BLADDER Height & Weight     Self, Situation, Place  Normal Continent, External catheter Weight: 79.8 kg Height:  5\' 4"  (162.6 cm)  BEHAVIORAL SYMPTOMS/MOOD NEUROLOGICAL BOWEL NUTRITION STATUS      Continent, Incontinent    AMBULATORY STATUS COMMUNICATION OF NEEDS Skin   Extensive Assist Verbally Normal                       Personal Care Assistance Level of Assistance  Bathing, Feeding, Dressing Bathing Assistance: Limited assistance Feeding assistance: Limited assistance Dressing Assistance: Maximum assistance     Functional Limitations Info             SPECIAL CARE FACTORS FREQUENCY  PT (By licensed PT), OT (By licensed OT)     PT Frequency: 5 times per week OT Frequency: 5 times per week            Contractures Contractures Info: Not present    Additional Factors  Info  Code Status, Allergies Code Status Info: DNR Allergies Info: Penicillin           Current Medications (11/24/2021):  This is the current hospital active medication list Current Facility-Administered Medications  Medication Dose Route Frequency Provider Last Rate Last Admin   acetaminophen (TYLENOL) tablet 650 mg  650 mg Oral Q6H PRN Mansy, Jan A, MD   650 mg at 11/22/21 1647   Or   acetaminophen (TYLENOL) suppository 650 mg  650 mg Rectal Q6H PRN Mansy, Jan A, MD       aspirin EC tablet 81 mg  81 mg Oral Daily Mansy, Jan A, MD   81 mg at 11/24/21 0811   cefTRIAXone (ROCEPHIN) 2 g in sodium chloride 0.9 % 100 mL IVPB  2 g Intravenous Q24H Mansy, Jan A, MD 200 mL/hr at 11/24/21 0638 2 g at 11/24/21 0638   enoxaparin (LOVENOX) injection 40 mg  40 mg Subcutaneous Q24H Mansy, Jan A, MD   40 mg at 11/24/21 0811   insulin aspart (novoLOG) injection 0-5 Units  0-5 Units Subcutaneous QHS Rai, Ripudeep K, MD       insulin aspart (novoLOG) injection 0-9 Units  0-9 Units Subcutaneous TID WC Rai, Ripudeep K, MD   1 Units at 11/24/21 0811   magnesium hydroxide (MILK OF MAGNESIA) suspension 30 mL  30 mL Oral Daily PRN Mansy, 13/06/23, MD       ondansetron (  ZOFRAN) tablet 4 mg  4 mg Oral Q6H PRN Mansy, Jan A, MD       Or   ondansetron [facility]) injection 4 mg  4 mg Intravenous Q6H PRN Mansy, Jan A, MD       traZODone (DESYREL) tablet 25 mg  25 mg Oral QHS PRN Mansy, Arvella Merles, MD         Discharge Medications: Please see discharge summary for a list of discharge medications.  Relevant Imaging Results:  Relevant Lab Results:   Additional Information SS# [id number]  Conception Oms, RN

## 2021-11-24 NOTE — TOC Progression Note (Signed)
Transition of Care [facility]) - Progression Note    Patient Details  Name: Derek Lynch MRN: [id number] Date of Birth: [birth year]  Transition of Care [facility]) CM/SW [place], RN Phone Number: 11/24/2021, 10:25 AM  Clinical Narrative:    Reached out to Santiago Glad the caregiver ather request to [phone] Left a voice mail asking for a return call at her earliest convenience.          Expected Discharge Plan and Services                                                 Social Determinants of Health (SDOH) Interventions    Readmission Risk Interventions     No data to display

## 2021-11-24 NOTE — Progress Notes (Signed)
Physical Therapy Treatment Patient Details Name: Derek Lynch MRN: [id number] DOB: [birth year] Today's Date: 11/24/2021   History of Present Illness Pt is a 81 year old male presenting after fall, admitted with traumatic rhabdo due to fall, Sepsis due to cellulitis ([place]), left lower extremity. PMH significant for  osteoarthritis, dyslipidemia    PT Comments    Pt completed transfer training, short distance gait with RW in room, and seated B LE ROM exercises with good tolerance. Pt will benefit from short term stay at SNF once medically cleared to regain strength and baseline functional independence prior to returning home.    Recommendations for follow up therapy are one component of a multi-disciplinary discharge planning process, led by the attending physician.  Recommendations may be updated based on patient status, additional functional criteria and insurance authorization.  Follow Up Recommendations  Skilled nursing-short term rehab (<3 hours/day) Can patient physically be transported by private vehicle: No   Assistance Recommended at Discharge Frequent or constant Supervision/Assistance  Patient can return home with the following A lot of help with walking and/or transfers;A lot of help with bathing/dressing/bathroom;Assist for transportation;Assistance with cooking/housework;Direct supervision/assist for financial management;Help with stairs or ramp for entrance   Equipment Recommendations  Other (comment) (TBD at next facility)    Recommendations for Other Services       Precautions / Restrictions Precautions Precautions: Fall     Mobility  Bed Mobility               General bed mobility comments:  (Received in recliner)    Transfers Overall transfer level: Needs assistance Equipment used: Rolling walker (2 wheels) Transfers: Sit to/from Stand Sit to Stand: Min assist Stand pivot transfers: Min assist              Ambulation/Gait Ambulation/Gait  assistance: Min guard Gait Distance (Feet): 4 Feet Assistive device: Rolling walker (2 wheels) Gait Pattern/deviations: Step-to pattern, Decreased step length - right, Decreased step length - left           Stairs             Wheelchair Mobility    Modified Rankin (Stroke Patients Only)       Balance Overall balance assessment: Needs assistance Sitting-balance support: Feet supported, Bilateral upper extremity supported Sitting balance-Leahy Scale: Fair Sitting balance - Comments: min assist static sitting   Standing balance support: During functional activity, Bilateral upper extremity supported, Reliant on assistive device for balance Standing balance-Leahy Scale: Fair                              Cognition Arousal/Alertness: Awake/alert Behavior During Therapy: WFL for tasks assessed/performed Overall Cognitive Status: No family/caregiver present to determine baseline cognitive functioning Area of Impairment: Orientation, Following commands                 Orientation Level: Disoriented to, Time Current Attention Level: Focused Memory: Decreased short-term memory Following Commands: Follows one step commands with increased time Safety/Judgement: Decreased awareness of safety, Decreased awareness of deficits Awareness: Intellectual Problem Solving: Slow processing, Decreased initiation, Difficulty sequencing, Requires verbal cues, Requires tactile cues General Comments: Some confusion noted about situation and home setup, able to perform one-step commands with increased time        Exercises General Exercises - Lower Extremity Ankle Circles/Pumps: AROM, Both, 10 reps Long Arc Quad: AROM, Both, 10 reps Hip Flexion/Marching: AROM, Both, 10 reps    General Comments  General comments (skin integrity, edema, etc.):  (Pt required assistance to help with hygiene after commode use)      Pertinent Vitals/Pain Pain Assessment Pain Assessment:  No/denies pain    Home Living                          Prior Function            PT Goals (current goals can now be found in the care plan section) Acute Rehab PT Goals Patient Stated Goal: none stated    Frequency    Min 2X/week      PT Plan Current plan remains appropriate    Co-evaluation              AM-PAC PT "6 Clicks" Mobility   Outcome Measure  Help needed turning from your back to your side while in a flat bed without using bedrails?: A Little Help needed moving from lying on your back to sitting on the side of a flat bed without using bedrails?: A Little Help needed moving to and from a bed to a chair (including a wheelchair)?: A Little Help needed standing up from a chair using your arms (e.g., wheelchair or bedside chair)?: A Lot Help needed to walk in hospital room?: A Lot Help needed climbing 3-5 steps with a railing? : A Lot 6 Click Score: 15    End of Session Equipment Utilized During Treatment: Gait belt Activity Tolerance: Patient tolerated treatment well Patient left: in chair;with chair alarm set;with call bell/phone within reach;with nursing/sitter in room Nurse Communication: Mobility status PT Visit Diagnosis: Difficulty in walking, not elsewhere classified (R26.2);Muscle weakness (generalized) (M62.81);Unsteadiness on feet (R26.81)     Time: 1330-1400 PT Time Calculation (min) (ACUTE ONLY): 30 min  Charges:  $Therapeutic Exercise: 8-22 mins $Therapeutic Activity: 8-22 mins                    Zadie Cleverly, PTA    Jannet Askew 11/24/2021, 4:03 PM

## 2021-11-24 NOTE — Care Management Important Message (Signed)
Important Message  Patient Details  Name: Derek Lynch MRN: [id number] Date of Birth: 06-18-1940   Medicare Important Message Given:  Yes     [name] 11/24/2021, 12:42 PM

## 2021-11-24 NOTE — TOC Progression Note (Signed)
Transition of Care [facility]) - Progression Note    Patient Details  Name: Derek Lynch MRN: [id number] Date of Birth: [birth year]  [facility]) CM/SW [place], RN Phone Number: 11/24/2021, 11:10 AM  Clinical Narrative:     [facility] [employer] offered a bed, I called to inform Santiago Glad that was her preference, I also let the patient know, Ins pending       Expected Discharge Plan and Services                                                 Social Determinants of Health (SDOH) Interventions    Readmission Risk Interventions     No data to display

## 2021-11-24 NOTE — TOC Progression Note (Addendum)
Transition of Care [facility]) - Progression Note    Patient Details  Name: Derek Lynch MRN: [id number] Date of Birth: 01-21-1940  Transition of Care [facility]) CM/SW [place], RN Phone Number: 11/24/2021, 10:31 AM  Clinical Narrative:   Damaris Schooner to Santiago Glad the caregiver for the patient [phone] She is agreeable to go to STR, SNF< she requested something in [place],   [place] obtained, [place] sent, FL2 completed         Expected Discharge Plan and Services                                                 Social Determinants of Health (SDOH) Interventions    Readmission Risk Interventions     No data to display

## 2021-11-25 LAB — COMPREHENSIVE METABOLIC PANEL
ALT: 52 U/L — ABNORMAL HIGH (ref 0–44)
AST: 49 U/L — ABNORMAL HIGH (ref 15–41)
Albumin: 2.9 g/dL — ABNORMAL LOW (ref 3.5–5.0)
Alkaline Phosphatase: 36 U/L — ABNORMAL LOW (ref 38–126)
Anion gap: 8 (ref 5–15)
BUN: 12 mg/dL (ref 8–23)
CO2: 23 mmol/L (ref 22–32)
Calcium: 8.6 mg/dL — ABNORMAL LOW (ref 8.9–10.3)
Chloride: 109 mmol/L (ref 98–111)
Creatinine, Ser: 0.99 mg/dL (ref 0.61–1.24)
GFR, Estimated: >60 mL/min (ref >60)
Glucose, Bld: 155 mg/dL — ABNORMAL HIGH (ref 70–99)
Potassium: 3.6 mmol/L (ref 3.5–5.1)
Sodium: 140 mmol/L (ref 135–145)
Total Bilirubin: 0.5 mg/dL (ref 0.3–1.2)
Total Protein: 6.5 g/dL (ref 6.5–8.1)

## 2021-11-25 LAB — GLUCOSE, CAPILLARY
Glucose-Capillary: 147 mg/dL — ABNORMAL HIGH (ref 70–99)
Glucose-Capillary: 149 mg/dL — ABNORMAL HIGH (ref 70–99)
Glucose-Capillary: 169 mg/dL — ABNORMAL HIGH (ref 70–99)

## 2021-11-25 MED ORDER — CEPHALEXIN 500 MG PO CAPS: 500.0000 mg | ORAL_CAPSULE | Freq: Four times a day (QID) | ORAL | 0 refills | Status: AC

## 2021-11-25 MED ORDER — FUROSEMIDE 20 MG PO TABS: 20.0000 mg | ORAL_TABLET | Freq: Every day | ORAL | Status: DC

## 2021-11-25 NOTE — Discharge Summary (Signed)
Physician Discharge Summary   Patient: Derek Lynch MRN: [id number] DOB: 10/05/1940  Admit date:     11/20/2021  Discharge date: 11/25/21  Discharge Physician: Estill Cotta, MD    PCP: [name], FNP   Recommendations at discharge:   Continue Keflex 500 mg p.o. 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  He denied any presyncope or syncope or paresthesias or focal muscle weakness.  He reported recent cough without wheezing or dyspnea.  Patient had left lower extremity erythema with mild tenderness. In ED, BP 141/81 with heart rate of 104. Creatinine 1.43, CK 10,147, leukocytosis 12.9 CT head showed no acute intracranial abnormality, showed chronic bilateral ethmoid sinusitis, chronic encephalomalacia CT of the C-spine revealed cervical spondylosis with degenerative disc disease causing multilevel osseous foraminal impingement. Chest x-ray showed cardiomegaly, streaky and hazy airspace disease at the bases that may be due to atelectasis or pneumonia.   Assessment and Plan:  Rhabdomyolysis, acute -Traumatic rhabdomyolysis due to mechanical fall -CK 10,147 on admission-> improved to 794 -Cr 0.9 -IV fluids discontinued       Sepsis due to cellulitis ([place]), left lower extremity -Met sepsis criteria due to leukocytosis, tachycardia, source likely due to cellulitis -Sepsis physiology resolved -Cellulitis improving, patient was placed on IV Rocephin, has received 5 days -Will  place on Keflex po for 2 more days to complete the course.       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     Fall with generalized debility PT evaluation recommended SNF   Obesity Estimated body mass index is 30.21 kg/m as calculated from the following:   Height as of this encounter: 5' 4" (1.626 m).   Weight as of this encounter: 79.8 kg.      Pain control - [employer] Controlled Substance Reporting System database was reviewed. and patient was instructed, not to drive, operate heavy machinery, perform activities at heights, swimming or participation in water activities or provide baby-sitting services while on Pain, Sleep and Anxiety Medications; until their outpatient Physician has advised to do so again. Also recommended to not to take more than prescribed Pain, Sleep and Anxiety Medications.  Consultants: None Procedures performed: None Disposition: Skilled nursing facility Diet recommendation:  Discharge Diet Orders (From admission, onward)     Start     Ordered   11/25/21 0000  Diet Carb Modified        11/25/21 1035           Carb modified diet DISCHARGE MEDICATION: Allergies as of 11/25/2021       Reactions   Penicillin G Other (See Comments)        Medication List  STOP taking these medications    clotrimazole-betamethasone cream Commonly known as: LOTRISONE   lisinopril 2.5 MG tablet Commonly known as: ZESTRIL   lovastatin 20 MG tablet Commonly known as: MEVACOR       TAKE these medications    acetaminophen 325 MG tablet Commonly known as: TYLENOL Take 650 mg by  mouth every 6 (six) hours as needed for mild pain, moderate pain or headache.   aspirin EC 81 MG tablet Take 81 mg by mouth daily.   cephALEXin 500 MG capsule Commonly known as: KEFLEX Take 1 capsule (500 mg total) by mouth 4 (four) times daily for 2 days. Start taking on: November 26, 2021   cholecalciferol 1000 units tablet Commonly known as: VITAMIN D Take 1,000 Units by mouth daily.   furosemide 20 MG tablet Commonly known as: LASIX Take 1 tablet (20 mg total) by mouth daily. Start taking on: November 27, 2021 What changed: These instructions start on November 27, 2021. If you are unsure what to do until then, ask your doctor or other care provider.   metFORMIN 500 MG tablet Commonly known as: GLUCOPHAGE Take by mouth daily with breakfast.   pioglitazone 15 MG tablet Commonly known as: ACTOS Take 15 mg by mouth daily.   vitamin C 1000 MG tablet Take 1,000 mg by mouth daily.        Contact information for follow-up providers     [name], [place]. Schedule an appointment as soon as possible for a visit .   Specialty: Family Medicine Contact information: [place] [address] [phone]              Contact information for after-discharge care     Destination     [facility] .   Service: Skilled Nursing Contact information: [address] [place] [phone]                    Discharge Exam: Danley Danker Weights   11/21/21 0207  Weight: 79.8 kg   S: Cellulitis improving, no acute complaints, no fevers or chills.  Vitals:   11/24/21 0801 11/24/21 1651 11/24/21 1955 11/25/21 0729  BP: (!) 125/92 (!) 152/81 (!) 155/74 (!) 155/91  Pulse: 71 75 76 71  Resp:   20   Temp: (!) 97.2 F (36.2 C) 98.2 F (36.8 C) 98.5 F (36.9 C) 97.9 F (36.6 C)  TempSrc:      SpO2: 94% 97% 99% 96%  Weight:      Height:        Physical Exam General:  Alert and oriented x 3, NAD Cardiovascular: S1 S2 clear, RRR.  Respiratory: CTAB, no wheezing, rales or rhonchi Gastrointestinal: Soft, nontender, nondistended, NBS Ext: LLE cellulitis much improved, no pedal edema bilaterally Neuro: no new deficits psych: Normal affect    Condition at discharge: fair  The results of significant diagnostics from this hospitalization (including imaging, microbiology, ancillary and laboratory) are listed below for reference.   Imaging Studies: DG Chest 2 View  Result Date: 11/20/2021 CLINICAL DATA:  Shortness of breath EXAM: CHEST - 2 VIEW COMPARISON:  05/18/2019 report FINDINGS: Cardiomegaly. Streaky and hazy airspace disease at the bases. Suspect small pleural effusions. No pneumothorax. IMPRESSION: 1. Streaky and hazy airspace disease at the bases may be due to atelectasis or pneumonia. Suspect small pleural effusions. 2. Cardiomegaly. Electronically Signed   By: Donavan Foil M.D.   On: 11/20/2021 17:35  CT HEAD WO CONTRAST (5MM)  Result Date: 11/20/2021 CLINICAL DATA:  Multiple falls, most recently this morning EXAM: CT HEAD WITHOUT CONTRAST CT CERVICAL SPINE WITHOUT CONTRAST TECHNIQUE: Multidetector CT imaging of the head and cervical spine was performed following the standard protocol without intravenous contrast. Multiplanar CT image reconstructions of the cervical spine were also generated. RADIATION DOSE REDUCTION: This exam was performed according to the departmental dose-optimization program which includes automated exposure control, adjustment of the mA and/or kV according to patient size and/or use of iterative reconstruction technique. COMPARISON:  None Available. FINDINGS: CT HEAD FINDINGS Brain: Symmetric chronic encephalomalacia the bilateral anterior superior frontal lobes with some involvement of the right anterior inferior frontal lobe. Otherwise, the brainstem, cerebellum, cerebral peduncles, thalamus, basal ganglia, basilar cisterns, and  ventricular system appear within normal limits. No intracranial hemorrhage, mass lesion, or acute CVA. Vascular: There is atherosclerotic calcification of the cavernous carotid arteries bilaterally. Skull: Unremarkable Sinuses/Orbits: Chronic bilateral ethmoid sinusitis. Hypoplastic frontal sinuses. Other: No supplemental non-categorized findings. CT CERVICAL SPINE FINDINGS Alignment: No vertebral subluxation is observed. Skull base and vertebrae: Spurring at the anterior C1-2 articulation with some chronically fragmented spur just above the joint on image 38 series 6. No cervical spine fracture or acute bony findings. Soft tissues and spinal canal: Unremarkable Disc levels: There is osseous foraminal stenosis on the left at C4-5, C5-6, and C6-7; and on the right C3-4, C4-5, C5-6, and C6-7. Potential mild central narrowing of the thecal sac at C5-6 due to posterior osseous ridging. Upper chest: Unremarkable Other: No supplemental non-categorized findings. IMPRESSION: 1. No acute intracranial findings or acute cervical spine findings. 2. 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 3. Chronic bilateral ethmoid sinusitis. 4. 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 5. Potential mild central narrowing of the thecal sac at C5-6 due to posterior osseous ridging. Electronically Signed   By: Van Clines M.D.   On: 11/20/2021 16:52   CT Cervical Spine Wo Contrast  Result Date: 11/20/2021 CLINICAL DATA:  Multiple falls, most recently this morning EXAM: CT HEAD WITHOUT CONTRAST CT CERVICAL SPINE WITHOUT CONTRAST TECHNIQUE: Multidetector CT imaging of the head and cervical spine was performed following the standard protocol without intravenous contrast. Multiplanar CT image reconstructions of the cervical spine were also generated. RADIATION DOSE REDUCTION: This exam was performed according to the departmental dose-optimization program which  includes automated exposure control, adjustment of the mA and/or kV according to patient size and/or use of iterative reconstruction technique. COMPARISON:  None Available. FINDINGS: CT HEAD FINDINGS Brain: Symmetric chronic encephalomalacia the bilateral anterior superior frontal lobes with some involvement of the right anterior inferior frontal lobe. Otherwise, the brainstem, cerebellum, cerebral peduncles, thalamus, basal ganglia, basilar cisterns, and ventricular system appear within normal limits. No intracranial hemorrhage, mass lesion, or acute CVA. Vascular: There is atherosclerotic calcification of the cavernous carotid arteries bilaterally. Skull: Unremarkable Sinuses/Orbits: Chronic bilateral ethmoid sinusitis. Hypoplastic frontal sinuses. Other: No supplemental non-categorized findings. CT CERVICAL SPINE FINDINGS Alignment: No vertebral subluxation is observed. Skull base and vertebrae: Spurring at the anterior C1-2 articulation with some chronically fragmented spur just above the joint on image 38 series 6. No cervical spine fracture or acute bony findings. Soft tissues and spinal canal: Unremarkable Disc levels: There is osseous foraminal stenosis on the left at C4-5, C5-6, and C6-7; and on the right C3-4, C4-5, C5-6, and C6-7. Potential mild central narrowing of the thecal sac at C5-6 due to posterior osseous ridging. Upper chest: Unremarkable Other:  No supplemental non-categorized findings. IMPRESSION: 1. No acute intracranial findings or acute cervical spine findings. 2. 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 3. Chronic bilateral ethmoid sinusitis. 4. 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 5. Potential mild central narrowing of the thecal sac at C5-6 due to posterior osseous ridging. Electronically Signed   By: Walter  Liebkemann M.D.   On: 11/20/2021 16:52    Microbiology: Results for orders placed or performed  during the hospital encounter of 11/20/21  Culture, blood (x 2)     Status: None (Preliminary result)   Collection Time: 11/21/21  7:35 AM   Specimen: BLOOD  Result Value Ref Range Status   Specimen Description BLOOD BLOOD LEFT HAND  Final   Special Requests   Final    BOTTLES DRAWN AEROBIC AND ANAEROBIC Blood Culture adequate volume   Culture   Final    NO GROWTH 4 DAYS Performed at [facility], [address] [place], [place] [address]    Report Status PENDING  Incomplete  Culture, blood (x 2)     Status: None (Preliminary result)   Collection Time: 11/21/21  7:42 AM   Specimen: BLOOD  Result Value Ref Range Status   Specimen Description BLOOD BLOOD RIGHT HAND  Final   Special Requests   Final    BOTTLES DRAWN AEROBIC AND ANAEROBIC Blood Culture adequate volume   Culture   Final    NO GROWTH 4 DAYS Performed at [facility], [address] [place], [place] [address]    Report Status PENDING  Incomplete    Labs: CBC: Recent Labs  Lab 11/20/21 1611 11/21/21 0350  WBC 12.9* 9.0  HGB 13.7 12.3*  HCT 40.0 36.2*  MCV 91.5 90.7  PLT 223 204   Basic Metabolic Panel: Recent Labs  Lab 11/21/21 0350 11/22/21 0509 11/23/21 0559 11/24/21 0415 11/25/21 0313  NA 136 136 140 138 140  K 3.6 3.5 4.0 3.9 3.6  CL 103 109 109 109 109  CO2 23 21* 24 22 23  GLUCOSE 172* 188* 155* 147* 155*  BUN 18 11 9 13 12  CREATININE 1.18 1.02 0.92 0.99 0.99  CALCIUM 8.3* 8.0* 8.6* 8.7* 8.6*   Liver Function Tests: Recent Labs  Lab 11/21/21 0734 11/22/21 0509 11/23/21 0559 11/24/21 0415 11/25/21 0313  AST 154* 109* 89* 58* 49*  ALT 52* 56* 63* 54* 52*  ALKPHOS 30* 26* 35* 32* 36*  BILITOT 0.8 0.4 0.5 0.6 0.5  PROT 6.5 6.0* 6.8 6.4* 6.5  ALBUMIN 3.1* 2.8* 3.1* 2.9* 2.9*   CBG: Recent Labs  Lab 11/24/21 0744 11/24/21 1208 11/24/21 1621 11/25/21 0016 11/25/21 0824  GLUCAP 143* 184* 130* 169* 147*    Discharge time spent: greater than 30  minutes.  Signed: Ripudeep Rai, MD [facility] 11/25/2021 

## 2021-11-25 NOTE — Progress Notes (Signed)
Occupational Therapy Treatment Patient Details Name: Derek Lynch MRN: [id number] DOB: 26-Jun-1940 Today's Date: 11/25/2021   History of present illness Pt is a 81 year old male presenting after fall, admitted with traumatic rhabdo due to fall, Sepsis due to cellulitis ([place]), left lower extremity. PMH significant for  osteoarthritis, dyslipidemia   OT comments  Pt seen for OT tx. Pt endorses discomfort in R shoulder but agreeable to OT tx. Pt completed bed mobility and ADL transfers from EOB and step pivot to/from [facility] with MIN A and MAX VC for hand placement as he tends to reach out and grasp for various things to pull up on but with VC and tactile cues to place hands on EOB or rails of BSC pt does better. Pt required MAX A for pericare in standing. Pt eager to discharge to rehab to get stronger. Continues to benefit and demonstrating progress towards goals.    Recommendations for follow up therapy are one component of a multi-disciplinary discharge planning process, led by the attending physician.  Recommendations may be updated based on patient status, additional functional criteria and insurance authorization.    Follow Up Recommendations  Skilled nursing-short term rehab (<3 hours/day)    Assistance Recommended at Discharge Frequent or constant Supervision/Assistance  Patient can return home with the following  A lot of help with walking and/or transfers;A lot of help with bathing/dressing/bathroom;Assist for transportation;Help with stairs or ramp for entrance;Direct supervision/assist for medications management;Assistance with cooking/housework   Equipment Recommendations  Other (comment) (defer to next venue)    Recommendations for Other Services      Precautions / Restrictions Precautions Precautions: Fall Restrictions Weight Bearing Restrictions: No       Mobility Bed Mobility Overal bed mobility: Needs Assistance Bed Mobility: Supine to Sit   Sidelying to sit: Min  assist, HOB elevated       General bed mobility comments: VC for hand placement as pt tries to reach out and grasp for therapist    Transfers Overall transfer level: Needs assistance Equipment used: Rolling walker (2 wheels) Transfers: Sit to/from Stand, Bed to chair/wheelchair/BSC Sit to Stand: Min assist     Step pivot transfers: Min assist     General transfer comment: VC for hand placement     Balance Overall balance assessment: Needs assistance Sitting-balance support: Feet supported, Bilateral upper extremity supported Sitting balance-Leahy Scale: Fair     Standing balance support: During functional activity, Bilateral upper extremity supported, Reliant on assistive device for balance Standing balance-Leahy Scale: Fair                             ADL either performed or assessed with clinical judgement   ADL Overall ADL's : Needs assistance/impaired                         Toilet Transfer: BSC/3in1;Rolling walker (2 wheels);Minimal assistance Toilet Transfer Details (indicate cue type and reason): step pivot, VC for hand placement to improve safety with descent onto [facility] Toileting- Clothing Manipulation and Hygiene: Maximal assistance;Sit to/from stand              Extremity/Trunk Assessment              Vision       Perception     Praxis      Cognition Arousal/Alertness: Awake/alert Behavior During Therapy: WFL for tasks assessed/performed Overall Cognitive Status: No family/caregiver present to  determine baseline cognitive functioning                                          Exercises      Shoulder Instructions       General Comments      Pertinent Vitals/ Pain       Pain Assessment Pain Assessment: 0-10 Pain Score: 7  Pain Location: R shoulder Pain Descriptors / Indicators: Aching Pain Intervention(s): Limited activity within patient's tolerance, Monitored during session,  Repositioned  Home Living                                          Prior Functioning/Environment              Frequency  Min 2X/week        Progress Toward Goals  OT Goals(current goals can now be found in the care plan section)  Progress towards OT goals: Progressing toward goals  Acute Rehab OT Goals Patient Stated Goal: get stronger OT Goal Formulation: With patient Time For Goal Achievement: 12/07/21 Potential to Achieve Goals: Good  Plan Discharge plan remains appropriate;Frequency remains appropriate    Co-evaluation                 AM-PAC OT "6 Clicks" Daily Activity     Outcome Measure   Help from another person eating meals?: None Help from another person taking care of personal grooming?: A Little Help from another person toileting, which includes using toliet, bedpan, or urinal?: A Lot Help from another person bathing (including washing, rinsing, drying)?: A Lot Help from another person to put on and taking off regular upper body clothing?: A Little Help from another person to put on and taking off regular lower body clothing?: A Lot 6 Click Score: 16    End of Session Equipment Utilized During Treatment: Gait belt;Rolling walker (2 wheels)  OT Visit Diagnosis: Unsteadiness on feet (R26.81);Muscle weakness (generalized) (M62.81);Other abnormalities of gait and mobility (R26.89);Other symptoms and signs involving cognitive function   Activity Tolerance Patient tolerated treatment well   Patient Left in bed;with call bell/phone within reach;with bed alarm set   Nurse Communication          Time: 2706-2376 OT Time Calculation (min): 21 min  Charges: OT General Charges $OT Visit: 1 Visit OT Treatments $Self Care/Home Management : 8-22 mins  Arman Filter., MPH, MS, OTR/L ascom [phone] 11/25/21, 10:47 AM

## 2021-11-25 NOTE — Plan of Care (Signed)
  Problem: Clinical Measurements: Goal: Cardiovascular complication will be avoided Outcome: Progressing   Problem: Nutrition: Goal: Adequate nutrition will be maintained Outcome: Progressing   Problem: Coping: Goal: Level of anxiety will decrease Outcome: Progressing   Problem: Elimination: Goal: Will not experience complications related to bowel motility Outcome: Progressing Goal: Will not experience complications related to urinary retention Outcome: Progressing   Problem: Pain Managment: Goal: General experience of comfort will improve Outcome: Progressing   Problem: Safety: Goal: Ability to remain free from injury will improve Outcome: Progressing   Problem: Skin Integrity: Goal: Risk for impaired skin integrity will decrease Outcome: Progressing   

## 2021-11-25 NOTE — TOC Progression Note (Addendum)
Transition of Care [facility]) - Progression Note    Patient Details  Name: Derek Lynch MRN: [id number] Date of Birth: 03-11-1940  Transition of Care [facility]) CM/SW [place], RN Phone Number: 11/25/2021, 10:51 AM  Clinical Narrative:     Silvano Bilis the caregiver to notify her of the DC today to [facility] He will go to Room 300 B, EMS Called  He is 3rd on list  Expected Discharge Plan: [place] Barriers to Discharge: Barriers Resolved  Expected Discharge Plan and Services Expected Discharge Plan: [place]         Expected Discharge Date: 11/25/21                                     Social Determinants of Health (SDOH) Interventions    Readmission Risk Interventions     No data to display

## 2021-11-25 NOTE — TOC Progression Note (Signed)
Transition of Care [facility]) - Progression Note    Patient Details  Name: Derek Lynch MRN: [id number] Date of Birth: Mar 16, 1940  Transition of Care [facility]) CM/SW [place], RN Phone Number: 11/25/2021, 8:13 AM  Clinical Narrative:     Ins approved to go to [facility] 11/6-11/8 [id number]       Expected Discharge Plan and Services                                                 Social Determinants of Health (SDOH) Interventions    Readmission Risk Interventions     No data to display

## 2021-11-26 LAB — CULTURE, BLOOD (ROUTINE X 2)
Culture: NO GROWTH
Culture: NO GROWTH
Special Requests: ADEQUATE
Special Requests: ADEQUATE

## 2023-03-26 ENCOUNTER — Emergency Department

## 2023-03-26 ENCOUNTER — Inpatient Hospital Stay

## 2023-03-26 ENCOUNTER — Other Ambulatory Visit: Payer: Self-pay

## 2023-03-26 ENCOUNTER — Inpatient Hospital Stay
Admission: EM | Admit: 2023-03-26 | Discharge: 2023-04-05 | DRG: 871 | Disposition: A | Discharge status: Skilled Nursing Facility | Attending: Internal Medicine | Admitting: Internal Medicine

## 2023-03-26 ENCOUNTER — Encounter: Payer: Self-pay | Admitting: Emergency Medicine

## 2023-03-26 DIAGNOSIS — W19XXXA Unspecified fall, initial encounter: Secondary | ICD-10-CM | POA: Diagnosis present

## 2023-03-26 DIAGNOSIS — S0083XA Contusion of other part of head, initial encounter: Secondary | ICD-10-CM | POA: Diagnosis present

## 2023-03-26 DIAGNOSIS — J9601 Acute respiratory failure with hypoxia: Secondary | ICD-10-CM | POA: Diagnosis present

## 2023-03-26 DIAGNOSIS — S0990XA Unspecified injury of head, initial encounter: Secondary | ICD-10-CM

## 2023-03-26 DIAGNOSIS — I110 Hypertensive heart disease with heart failure: Secondary | ICD-10-CM | POA: Diagnosis present

## 2023-03-26 DIAGNOSIS — I2510 Atherosclerotic heart disease of native coronary artery without angina pectoris: Secondary | ICD-10-CM | POA: Diagnosis present

## 2023-03-26 DIAGNOSIS — Z1152 Encounter for screening for COVID-19: Secondary | ICD-10-CM

## 2023-03-26 DIAGNOSIS — Z66 Do not resuscitate: Secondary | ICD-10-CM | POA: Diagnosis present

## 2023-03-26 DIAGNOSIS — Z7984 Long term (current) use of oral hypoglycemic drugs: Secondary | ICD-10-CM | POA: Diagnosis not present

## 2023-03-26 DIAGNOSIS — A4189 Other specified sepsis: Secondary | ICD-10-CM | POA: Diagnosis present

## 2023-03-26 DIAGNOSIS — G9341 Metabolic encephalopathy: Secondary | ICD-10-CM | POA: Diagnosis present

## 2023-03-26 DIAGNOSIS — J1001 Influenza due to other identified influenza virus with the same other identified influenza virus pneumonia: Secondary | ICD-10-CM | POA: Diagnosis present

## 2023-03-26 DIAGNOSIS — J209 Acute bronchitis, unspecified: Secondary | ICD-10-CM | POA: Diagnosis present

## 2023-03-26 DIAGNOSIS — R0902 Hypoxemia: Secondary | ICD-10-CM | POA: Diagnosis not present

## 2023-03-26 DIAGNOSIS — E872 Acidosis, unspecified: Secondary | ICD-10-CM | POA: Diagnosis present

## 2023-03-26 DIAGNOSIS — Z87891 Personal history of nicotine dependence: Secondary | ICD-10-CM

## 2023-03-26 DIAGNOSIS — R1312 Dysphagia, oropharyngeal phase: Secondary | ICD-10-CM

## 2023-03-26 DIAGNOSIS — J101 Influenza due to other identified influenza virus with other respiratory manifestations: Secondary | ICD-10-CM | POA: Diagnosis present

## 2023-03-26 DIAGNOSIS — Z6833 Body mass index (BMI) 33.0-33.9, adult: Secondary | ICD-10-CM | POA: Diagnosis not present

## 2023-03-26 DIAGNOSIS — M6282 Rhabdomyolysis: Secondary | ICD-10-CM | POA: Diagnosis present

## 2023-03-26 DIAGNOSIS — E785 Hyperlipidemia, unspecified: Secondary | ICD-10-CM | POA: Diagnosis present

## 2023-03-26 DIAGNOSIS — Z880 Allergy status to penicillin: Secondary | ICD-10-CM

## 2023-03-26 DIAGNOSIS — E876 Hypokalemia: Secondary | ICD-10-CM | POA: Diagnosis not present

## 2023-03-26 DIAGNOSIS — J09X1 Influenza due to identified novel influenza A virus with pneumonia: Secondary | ICD-10-CM | POA: Diagnosis not present

## 2023-03-26 DIAGNOSIS — I872 Venous insufficiency (chronic) (peripheral): Secondary | ICD-10-CM | POA: Diagnosis present

## 2023-03-26 DIAGNOSIS — Z7982 Long term (current) use of aspirin: Secondary | ICD-10-CM

## 2023-03-26 DIAGNOSIS — R6520 Severe sepsis without septic shock: Secondary | ICD-10-CM | POA: Diagnosis present

## 2023-03-26 DIAGNOSIS — N179 Acute kidney failure, unspecified: Secondary | ICD-10-CM | POA: Diagnosis present

## 2023-03-26 DIAGNOSIS — I509 Heart failure, unspecified: Secondary | ICD-10-CM

## 2023-03-26 DIAGNOSIS — E1165 Type 2 diabetes mellitus with hyperglycemia: Secondary | ICD-10-CM | POA: Diagnosis present

## 2023-03-26 DIAGNOSIS — I82622 Acute embolism and thrombosis of deep veins of left upper extremity: Secondary | ICD-10-CM | POA: Diagnosis present

## 2023-03-26 DIAGNOSIS — I5031 Acute diastolic (congestive) heart failure: Secondary | ICD-10-CM | POA: Diagnosis not present

## 2023-03-26 DIAGNOSIS — Z8249 Family history of ischemic heart disease and other diseases of the circulatory system: Secondary | ICD-10-CM

## 2023-03-26 DIAGNOSIS — E669 Obesity, unspecified: Secondary | ICD-10-CM | POA: Diagnosis present

## 2023-03-26 DIAGNOSIS — E119 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: Secondary | ICD-10-CM

## 2023-03-26 DIAGNOSIS — Z79899 Other long term (current) drug therapy: Secondary | ICD-10-CM

## 2023-03-26 DIAGNOSIS — J690 Pneumonitis due to inhalation of food and vomit: Secondary | ICD-10-CM | POA: Diagnosis present

## 2023-03-26 LAB — CBC WITH DIFFERENTIAL/PLATELET
Abs Immature Granulocytes: 0.1 10*3/uL — ABNORMAL HIGH (ref 0.00–0.07)
Basophils Absolute: 0 10*3/uL (ref 0.0–0.1)
Basophils Relative: 0 %
Eosinophils Absolute: 0 10*3/uL (ref 0.0–0.5)
Eosinophils Relative: 0 %
HCT: 37.8 % — ABNORMAL LOW (ref 39.0–52.0)
Hemoglobin: 12.6 g/dL — ABNORMAL LOW (ref 13.0–17.0)
Immature Granulocytes: 1 %
Lymphocytes Relative: 6 %
Lymphs Abs: 0.5 10*3/uL — ABNORMAL LOW (ref 0.7–4.0)
MCH: 31.4 pg (ref 26.0–34.0)
MCHC: 33.3 g/dL (ref 30.0–36.0)
MCV: 94.3 fL (ref 80.0–100.0)
Monocytes Absolute: 1 10*3/uL (ref 0.1–1.0)
Monocytes Relative: 11 %
Neutro Abs: 6.9 10*3/uL (ref 1.7–7.7)
Neutrophils Relative %: 82 %
Platelets: 199 10*3/uL (ref 150–400)
RBC: 4.01 MIL/uL — ABNORMAL LOW (ref 4.22–5.81)
RDW: 13.8 % (ref 11.5–15.5)
WBC: 8.5 10*3/uL (ref 4.0–10.5)
nRBC: 0 % (ref 0.0–0.2)

## 2023-03-26 LAB — MRSA NEXT GEN BY PCR, NASAL: MRSA by PCR Next Gen: NOT DETECTED

## 2023-03-26 LAB — BRAIN NATRIURETIC PEPTIDE: B Natriuretic Peptide: 326.4 pg/mL — ABNORMAL HIGH (ref 0.0–100.0)

## 2023-03-26 LAB — COMPREHENSIVE METABOLIC PANEL
ALT: 41 U/L (ref 0–44)
AST: 99 U/L — ABNORMAL HIGH (ref 15–41)
Albumin: 3.5 g/dL (ref 3.5–5.0)
Alkaline Phosphatase: 33 U/L — ABNORMAL LOW (ref 38–126)
Anion gap: 15 (ref 5–15)
BUN: 18 mg/dL (ref 8–23)
CO2: 24 mmol/L (ref 22–32)
Calcium: 8.7 mg/dL — ABNORMAL LOW (ref 8.9–10.3)
Chloride: 98 mmol/L (ref 98–111)
Creatinine, Ser: 1.43 mg/dL — ABNORMAL HIGH (ref 0.61–1.24)
GFR, Estimated: 50 mL/min — ABNORMAL LOW (ref >60)
Glucose, Bld: 242 mg/dL — ABNORMAL HIGH (ref 70–99)
Potassium: 4.3 mmol/L (ref 3.5–5.1)
Sodium: 137 mmol/L (ref 135–145)
Total Bilirubin: 0.7 mg/dL (ref 0.0–1.2)
Total Protein: 7.9 g/dL (ref 6.5–8.1)

## 2023-03-26 LAB — HEMOGLOBIN A1C
Hgb A1c MFr Bld: 8.5 % — ABNORMAL HIGH (ref 4.8–5.6)
Mean Plasma Glucose: 197.25 mg/dL

## 2023-03-26 LAB — PROTIME-INR
INR: 1.1 (ref 0.8–1.2)
Prothrombin Time: 14 s (ref 11.4–15.2)

## 2023-03-26 LAB — LACTIC ACID, PLASMA
Lactic Acid, Venous: 3.6 mmol/L (ref 0.5–1.9)
Lactic Acid, Venous: 2.2 mmol/L (ref 0.5–1.9)

## 2023-03-26 LAB — GLUCOSE, CAPILLARY: Glucose-Capillary: 180 mg/dL — ABNORMAL HIGH (ref 70–99)

## 2023-03-26 LAB — RESP PANEL BY RT-PCR (RSV, FLU A&B, COVID)  RVPGX2
Influenza A by PCR: POSITIVE — AB
Influenza B by PCR: NEGATIVE
Resp Syncytial Virus by PCR: NEGATIVE
SARS Coronavirus 2 by RT PCR: NEGATIVE

## 2023-03-26 LAB — LIPASE, BLOOD: Lipase: 24 U/L (ref 11–51)

## 2023-03-26 LAB — TROPONIN I (HIGH SENSITIVITY)
Troponin I (High Sensitivity): 33 ng/L — ABNORMAL HIGH (ref <18)
Troponin I (High Sensitivity): 31 ng/L — ABNORMAL HIGH (ref <18)

## 2023-03-26 LAB — APTT: aPTT: 30 s (ref 24–36)

## 2023-03-26 LAB — CK: Total CK: 4531 U/L — ABNORMAL HIGH (ref 49–397)

## 2023-03-26 MED ORDER — FUROSEMIDE 20 MG PO TABS
20.0000 mg | ORAL_TABLET | Freq: Every day | ORAL | Status: DC
  Administered 2023-03-27 – 2023-03-28 (×2): 20 mg via ORAL
  Filled 2023-03-26 (×2): qty 1

## 2023-03-26 MED ORDER — METHYLPREDNISOLONE SODIUM SUCC 125 MG IJ SOLR
125.0000 mg | Freq: Once | INTRAMUSCULAR | Status: AC
  Administered 2023-03-26: 125 mg via INTRAVENOUS
  Filled 2023-03-26: qty 2

## 2023-03-26 MED ORDER — ALBUTEROL SULFATE (2.5 MG/3ML) 0.083% IN NEBU: 2.5000 mg | INHALATION_SOLUTION | RESPIRATORY_TRACT | Status: DC | PRN

## 2023-03-26 MED ORDER — LACTATED RINGERS IV BOLUS (SEPSIS)
500.0000 mL | Freq: Once | INTRAVENOUS | Status: AC
  Administered 2023-03-26: 500 mL via INTRAVENOUS

## 2023-03-26 MED ORDER — SODIUM CHLORIDE 0.9 % IV SOLN
2.0000 g | Freq: Once | INTRAVENOUS | Status: AC
  Administered 2023-03-26: 2 g via INTRAVENOUS
  Filled 2023-03-26: qty 12.5

## 2023-03-26 MED ORDER — IOHEXOL 350 MG/ML SOLN
80.0000 mL | Freq: Once | INTRAVENOUS | Status: AC | PRN
  Administered 2023-03-26: 80 mL via INTRAVENOUS

## 2023-03-26 MED ORDER — OSELTAMIVIR PHOSPHATE 75 MG PO CAPS
75.0000 mg | ORAL_CAPSULE | Freq: Once | ORAL | Status: AC
  Administered 2023-03-26: 75 mg via ORAL
  Filled 2023-03-26: qty 1

## 2023-03-26 MED ORDER — PRAVASTATIN SODIUM 20 MG PO TABS
20.0000 mg | ORAL_TABLET | Freq: Every day | ORAL | Status: DC
  Administered 2023-03-26: 20 mg via ORAL
  Filled 2023-03-26: qty 1

## 2023-03-26 MED ORDER — ONDANSETRON HCL 4 MG/2ML IJ SOLN
4.0000 mg | Freq: Four times a day (QID) | INTRAMUSCULAR | Status: DC | PRN
Start: 2023-03-26 — End: 2023-04-05

## 2023-03-26 MED ORDER — BUDESONIDE 0.5 MG/2ML IN SUSP
2.0000 mg | Freq: Two times a day (BID) | RESPIRATORY_TRACT | Status: DC
  Administered 2023-03-26 – 2023-03-27 (×2): 2 mg via RESPIRATORY_TRACT
  Filled 2023-03-26 (×2): qty 8

## 2023-03-26 MED ORDER — FUROSEMIDE 10 MG/ML IJ SOLN
40.0000 mg | Freq: Once | INTRAMUSCULAR | Status: AC
Start: 2023-03-26 — End: 2023-03-26
  Administered 2023-03-26: 40 mg via INTRAVENOUS
  Filled 2023-03-26: qty 4

## 2023-03-26 MED ORDER — VANCOMYCIN HCL IN DEXTROSE 1-5 GM/200ML-% IV SOLN
1000.0000 mg | Freq: Once | INTRAVENOUS | Status: DC
  Filled 2023-03-26: qty 200

## 2023-03-26 MED ORDER — LACTATED RINGERS IV BOLUS (SEPSIS): 1000.0000 mL | Freq: Once | INTRAVENOUS | Status: DC

## 2023-03-26 MED ORDER — PREDNISONE 20 MG PO TABS
40.0000 mg | ORAL_TABLET | Freq: Every day | ORAL | Status: DC
  Administered 2023-03-27 – 2023-03-28 (×2): 40 mg via ORAL
  Filled 2023-03-26 (×2): qty 2

## 2023-03-26 MED ORDER — SODIUM CHLORIDE 0.9 % IV SOLN
1.0000 g | INTRAVENOUS | Status: AC
  Administered 2023-03-26 – 2023-04-01 (×7): 1 g via INTRAVENOUS
  Filled 2023-03-26 (×7): qty 10

## 2023-03-26 MED ORDER — OSELTAMIVIR PHOSPHATE 30 MG PO CAPS
30.0000 mg | ORAL_CAPSULE | Freq: Two times a day (BID) | ORAL | Status: AC
Start: 2023-03-27 — End: 2023-03-31
  Administered 2023-03-27 – 2023-03-31 (×10): 30 mg via ORAL
  Filled 2023-03-26 (×11): qty 1

## 2023-03-26 MED ORDER — ACETAMINOPHEN 325 MG PO TABS
650.0000 mg | ORAL_TABLET | Freq: Four times a day (QID) | ORAL | Status: DC | PRN
  Administered 2023-04-03: 650 mg via ORAL
  Filled 2023-03-26: qty 2

## 2023-03-26 MED ORDER — HYDROCORTISONE 1 % EX OINT
TOPICAL_OINTMENT | Freq: Two times a day (BID) | CUTANEOUS | Status: DC
  Administered 2023-03-27 – 2023-03-31 (×2): 1 via TOPICAL
  Filled 2023-03-26: qty 28.35

## 2023-03-26 MED ORDER — IPRATROPIUM-ALBUTEROL 0.5-2.5 (3) MG/3ML IN SOLN
3.0000 mL | Freq: Four times a day (QID) | RESPIRATORY_TRACT | Status: DC
  Administered 2023-03-26 – 2023-03-27 (×3): 3 mL via RESPIRATORY_TRACT
  Filled 2023-03-26 (×3): qty 3

## 2023-03-26 MED ORDER — SODIUM CHLORIDE 0.9 % IV SOLN: INTRAVENOUS | Status: DC

## 2023-03-26 MED ORDER — ONDANSETRON HCL 4 MG PO TABS: 4.0000 mg | ORAL_TABLET | Freq: Four times a day (QID) | ORAL | Status: DC | PRN

## 2023-03-26 MED ORDER — LACTATED RINGERS IV SOLN: INTRAVENOUS | Status: DC

## 2023-03-26 MED ORDER — METRONIDAZOLE 500 MG/100ML IV SOLN
500.0000 mg | Freq: Two times a day (BID) | INTRAVENOUS | Status: AC
  Administered 2023-03-26 – 2023-04-01 (×13): 500 mg via INTRAVENOUS
  Filled 2023-03-26 (×13): qty 100

## 2023-03-26 MED ORDER — ASPIRIN 81 MG PO TBEC
81.0000 mg | DELAYED_RELEASE_TABLET | Freq: Every day | ORAL | Status: DC
  Administered 2023-03-27 – 2023-04-05 (×10): 81 mg via ORAL
  Filled 2023-03-26 (×10): qty 1

## 2023-03-26 MED ORDER — INSULIN ASPART 100 UNIT/ML IJ SOLN
0.0000 [IU] | Freq: Every day | INTRAMUSCULAR | Status: DC
Start: 2023-03-26 — End: 2023-04-05
  Administered 2023-03-27 – 2023-04-02 (×5): 2 [IU] via SUBCUTANEOUS
  Filled 2023-03-26 (×5): qty 1

## 2023-03-26 MED ORDER — LISINOPRIL 5 MG PO TABS
2.5000 mg | ORAL_TABLET | Freq: Every day | ORAL | Status: DC
  Administered 2023-03-27 – 2023-04-05 (×10): 2.5 mg via ORAL
  Filled 2023-03-26 (×10): qty 1

## 2023-03-26 MED ORDER — VANCOMYCIN HCL IN DEXTROSE 1-5 GM/200ML-% IV SOLN
1000.0000 mg | Freq: Once | INTRAVENOUS | Status: AC
  Administered 2023-03-26: 1000 mg via INTRAVENOUS
  Filled 2023-03-26: qty 200

## 2023-03-26 MED ORDER — METRONIDAZOLE 500 MG/100ML IV SOLN
500.0000 mg | Freq: Once | INTRAVENOUS | Status: AC
  Administered 2023-03-26: 500 mg via INTRAVENOUS
  Filled 2023-03-26: qty 100

## 2023-03-26 MED ORDER — VANCOMYCIN HCL IN DEXTROSE 1-5 GM/200ML-% IV SOLN
1000.0000 mg | Freq: Once | INTRAVENOUS | Status: AC
  Administered 2023-03-26: 1000 mg via INTRAVENOUS

## 2023-03-26 MED ORDER — INSULIN ASPART 100 UNIT/ML IJ SOLN
0.0000 [IU] | Freq: Three times a day (TID) | INTRAMUSCULAR | Status: DC
  Administered 2023-03-27 (×2): 3 [IU] via SUBCUTANEOUS
  Administered 2023-03-27: 5 [IU] via SUBCUTANEOUS
  Administered 2023-03-28: 3 [IU] via SUBCUTANEOUS
  Administered 2023-03-28: 2 [IU] via SUBCUTANEOUS
  Administered 2023-03-28: 1 [IU] via SUBCUTANEOUS
  Administered 2023-03-29: 2 [IU] via SUBCUTANEOUS
  Administered 2023-03-29 – 2023-03-30 (×2): 3 [IU] via SUBCUTANEOUS
  Administered 2023-03-30: 7 [IU] via SUBCUTANEOUS
  Administered 2023-03-30: 1 [IU] via SUBCUTANEOUS
  Administered 2023-03-31 (×2): 5 [IU] via SUBCUTANEOUS
  Administered 2023-03-31: 2 [IU] via SUBCUTANEOUS
  Administered 2023-04-01 (×2): 3 [IU] via SUBCUTANEOUS
  Administered 2023-04-01: 1 [IU] via SUBCUTANEOUS
  Administered 2023-04-02: 2 [IU] via SUBCUTANEOUS
  Administered 2023-04-02: 7 [IU] via SUBCUTANEOUS
  Administered 2023-04-02: 5 [IU] via SUBCUTANEOUS
  Administered 2023-04-03 – 2023-04-04 (×5): 3 [IU] via SUBCUTANEOUS
  Administered 2023-04-04: 2 [IU] via SUBCUTANEOUS
  Administered 2023-04-05: 3 [IU] via SUBCUTANEOUS
  Administered 2023-04-05: 2 [IU] via SUBCUTANEOUS
  Filled 2023-03-26 (×27): qty 1

## 2023-03-26 MED ORDER — ENOXAPARIN SODIUM 60 MG/0.6ML IJ SOSY
0.5000 mg/kg | PREFILLED_SYRINGE | INTRAMUSCULAR | Status: DC
  Administered 2023-03-26 – 2023-04-03 (×9): 45 mg via SUBCUTANEOUS
  Filled 2023-03-26 (×9): qty 0.6

## 2023-03-26 NOTE — ED Notes (Addendum)
 Pt up to the bathroom at this time. Pt able to stand but not well. This RN would not recommend getting the pt up. Linens changed. New chux pad placed.

## 2023-03-26 NOTE — Plan of Care (Signed)
  Problem: Education: Goal: Ability to describe self-care measures that may prevent or decrease complications (Diabetes Survival Skills Education) will improve Outcome: Progressing   Problem: Skin Integrity: Goal: Risk for impaired skin integrity will decrease Outcome: Progressing   Problem: Activity: Goal: Risk for activity intolerance will decrease Outcome: Progressing   Problem: Safety: Goal: Ability to remain free from injury will improve Outcome: Progressing   Problem: Skin Integrity: Goal: Risk for impaired skin integrity will decrease Outcome: Progressing

## 2023-03-26 NOTE — ED Triage Notes (Signed)
 Patient presents to ED via [facility] from home for an unwitnessed fall. Unknown if pt hit head during fall. Pt only complaining of R arm pain post fall.   Pt also 77% on RA, placed on NRB with EMS and neb administered.   New onset AMS per EMS.

## 2023-03-26 NOTE — Progress Notes (Signed)
 Nurse reported the patient developed increasing respiratory distress.  Went to see the patient patient has crackles and wheezing over and tachypneic.  Suspect patient in heart failure, discontinue IV fluid and 1 dose of IV Lasix given.  Stat chest x-ray ordered and escalated to PCU for BiPAP.

## 2023-03-26 NOTE — Consult Note (Signed)
 CODE SEPSIS - PHARMACY COMMUNICATION  **Broad-spectrum antimicrobials should be administered within one hour of sepsis diagnosis**  Time Code Sepsis call or [place] was received: 1147  Antibiotics ordered: Cefepime, Vancomycin, Metronidazole  Time of first antibiotic administration: 1210  Additional action taken by pharmacy: N/A  If necessary, name of provider/nurse contacted: N/A    Will M. Dareen Piano, PharmD Clinical Pharmacist 03/26/2023 11:52 AM

## 2023-03-26 NOTE — ED Provider Notes (Signed)
 [facility] Provider Note    Event Date/Time   First MD Initiated Contact with Patient 03/26/23 1041     (approximate)   History   Fall   HPI  Derek Lynch is a 83 y.o. male past medical history significant for diabetes, hyperlipidemia, chronic venous stasis, who presents to the emergency department following a fall.  Patient presented to the emergency department following an unwitnessed fall.  Patient does not recall the events of the fall.  Complaining of a mild headache and pain to his arm on the left side.  When EMS arrived patient was found to be hypoxic to 70% on room air.  He was placed on a nonrebreather and given a nebulizer treatment.  Patient lives alone by himself.  They stated that he appeared confused.  Patient does states he is uncertain of what happened.  States he does not feel well.     Physical Exam   Triage Vital Signs: ED Triage Vitals [03/26/23 1037]  Encounter Vitals Group     BP      Systolic BP Percentile      Diastolic BP Percentile      Pulse      Resp      Temp      Temp src      SpO2      Weight      Height      Head Circumference      Peak Flow      Pain Score 8     Pain Loc      Pain Education      Exclude from Growth Chart     Most recent vital signs: Vitals:   03/26/23 1500 03/26/23 1550  BP: 120/84 118/76  Pulse:  99  Resp: (!) 35 (!) 26  Temp:    SpO2:  94%    Physical Exam Constitutional:      Appearance: He is well-developed.  HENT:     Head:     Comments: Hematoma to the left forehead.  Extraocular movements are intact.    Mouth/Throat:     Mouth: Mucous membranes are dry.  Eyes:     Extraocular Movements: Extraocular movements intact.     Conjunctiva/sclera: Conjunctivae normal.     Pupils: Pupils are equal, round, and reactive to light.  Cardiovascular:     Rate and Rhythm: Regular rhythm.     Heart sounds: No murmur heard. Pulmonary:     Effort: Respiratory distress present.      Breath sounds: Wheezing present.     Comments: 5 L nasal cannula at 94%.  Diffuse rhonchi throughout all lung fields. Abdominal:     Tenderness: There is no abdominal tenderness.  Musculoskeletal:        General: Normal range of motion.     Cervical back: Normal range of motion. No tenderness.     Right lower leg: Edema present.     Left lower leg: Edema present.     Comments: Mild edema to bilateral lower extremities with erythema and dry skin, excoriation.  +2 DP pulses that are equal bilaterally.  No unilateral leg swelling.  Significant swelling to the left upper extremity when compared to the right.  Tenderness to palpation to the left shoulder and humerus.  No tenderness to the elbow or forearm.  Skin:    General: Skin is warm.  Neurological:     General: No focal deficit present.     Mental  Status: He is alert. Mental status is at baseline.     IMPRESSION / MDM / ASSESSMENT AND PLAN / ED COURSE  I reviewed the triage vital signs and the nursing notes.  Differential diagnosis including intracranial hemorrhage, sepsis, pulmonary embolism, pneumonia, ACS, dehydration, heart failure exacerbation  On arrival patient was afebrile, tachypneic, 93% on 6 L nasal cannula and normotensive  On arrival patient met criteria for sepsis criteria.  Blood cultures obtained.  Felt that 30 cc/kg of IV fluids may be detrimental given concern for possible heart failure, will give 1 L bolus and reevaluate.  Started on antibiotics to cover unknown source.  EKG  I, Corena Herter, the attending physician, personally viewed and interpreted this ECG.   Rate: Normal  Rhythm: Normal sinus  Axis: Normal  Intervals: Normal  ST&T Change: None Significant artifact.  No specific ST elevation or depression.  Nonspecific changes.  No tachycardic or bradycardic dysrhythmias while on cardiac telemetry.  RADIOLOGY I independently reviewed imaging, my interpretation of imaging: Chest x-ray with findings  concerning for multifocal pneumonia versus pulmonary edema.  CT scan of the head without signs of intracranial hemorrhage  X-ray of the left shoulder and humerus with no acute fracture or dislocation.  CTA of the chest without obvious central pulmonary embolism.  Findings concerning for multifocal pneumonia  LABS (all labs ordered are listed, but only abnormal results are displayed) Labs interpreted as -    Labs Reviewed  RESP PANEL BY RT-PCR (RSV, FLU A&B, COVID)  RVPGX2 - Abnormal; Notable for the following components:      Result Value   Influenza A by PCR POSITIVE (*)    All other components within normal limits  LACTIC ACID, PLASMA - Abnormal; Notable for the following components:   Lactic Acid, Venous 3.6 (*)    All other components within normal limits  LACTIC ACID, PLASMA - Abnormal; Notable for the following components:   Lactic Acid, Venous 2.2 (*)    All other components within normal limits  COMPREHENSIVE METABOLIC PANEL - Abnormal; Notable for the following components:   Glucose, Bld 242 (*)    Creatinine, Ser 1.43 (*)    Calcium 8.7 (*)    AST 99 (*)    Alkaline Phosphatase 33 (*)    GFR, Estimated 50 (*)    All other components within normal limits  CBC WITH DIFFERENTIAL/PLATELET - Abnormal; Notable for the following components:   RBC 4.01 (*)    Hemoglobin 12.6 (*)    HCT 37.8 (*)    Lymphs Abs 0.5 (*)    Abs Immature Granulocytes 0.10 (*)    All other components within normal limits  BRAIN NATRIURETIC PEPTIDE - Abnormal; Notable for the following components:   B Natriuretic Peptide 326.4 (*)    All other components within normal limits  BLOOD GAS, VENOUS - Abnormal; Notable for the following components:   pO2, Ven <31 (*)    Bicarbonate 32.0 (*)    Acid-Base Excess 4.7 (*)    All other components within normal limits  TROPONIN I (HIGH SENSITIVITY) - Abnormal; Notable for the following components:   Troponin I (High Sensitivity) 33 (*)    All other  components within normal limits  TROPONIN I (HIGH SENSITIVITY) - Abnormal; Notable for the following components:   Troponin I (High Sensitivity) 31 (*)    All other components within normal limits  CULTURE, BLOOD (ROUTINE X 2)  CULTURE, BLOOD (ROUTINE X 2)  MRSA NEXT GEN BY PCR, NASAL  PROTIME-INR  APTT  LIPASE, BLOOD  URINALYSIS, W/ REFLEX TO CULTURE (INFECTION SUSPECTED)  CK     MDM  X-ray of the left upper extremity with no acute fracture or dislocation.  DVT of the left upper extremity with no findings of upper extremity DVT.  Chest x-ray concerning for multifocal pneumonia versus pulmonary edema  Influenza A positive.  Patient was started on Tamiflu.  Initial lactic acid significantly elevated at 3.6.  No significant hypercarbia on blood gas.  Troponin mildly elevated but stable at 31.  Acute kidney injury with a creatinine elevated up to 1.4.  Hyperglycemia but does not meet criteria for DKA.  Low suspicion for hyperosmolar.  Mildly elevated BNP.  Consulted the hospitalist for admission for acute hypoxic respiratory failure in the setting of influenza A and concerning for multifocal pneumonia.     PROCEDURES:  Critical Care performed: yes  .Critical Care  Performed by: Corena Herter, MD Authorized by: Corena Herter, MD   Critical care provider statement:    Critical care time (minutes):  45   Critical care time was exclusive of:  Separately billable procedures and treating other patients   Critical care was necessary to treat or prevent imminent or life-threatening deterioration of the following conditions:  Respiratory failure and sepsis   Critical care was time spent personally by me on the following activities:  Development of treatment plan with patient or surrogate, discussions with consultants, evaluation of patient's response to treatment, examination of patient, ordering and review of laboratory studies, ordering and review of radiographic studies, ordering and  performing treatments and interventions, pulse oximetry, re-evaluation of patient's condition and review of old charts   Care discussed with: admitting provider     Patient's presentation is most consistent with acute presentation with potential threat to life or bodily function.   MEDICATIONS ORDERED IN ED: Medications  lactated ringers infusion ( Intravenous New Bag/Given 03/26/23 1427)  vancomycin (VANCOCIN) IVPB 1000 mg/200 mL premix (1,000 mg Intravenous New Bag/Given 03/26/23 1553)    Followed by  vancomycin (VANCOCIN) IVPB 1000 mg/200 mL premix (has no administration in time range)  ceFEPIme (MAXIPIME) 2 g in sodium chloride 0.9 % 100 mL IVPB (0 g Intravenous Stopped 03/26/23 1321)  metroNIDAZOLE (FLAGYL) IVPB 500 mg (0 mg Intravenous Stopped 03/26/23 1525)  lactated ringers bolus 500 mL (0 mLs Intravenous Stopped 03/26/23 1427)  oseltamivir (TAMIFLU) capsule 75 mg (75 mg Oral Given 03/26/23 1557)  iohexol (OMNIPAQUE) 350 MG/ML injection 80 mL (80 mLs Intravenous Contrast Given 03/26/23 1526)    FINAL CLINICAL IMPRESSION(S) / ED DIAGNOSES   Final diagnoses:  Fall, initial encounter  Injury of head, initial encounter  Hypoxic  Influenza A     Rx / DC Orders   ED Discharge Orders     None        Note:  This document was prepared using Dragon voice recognition software and may include unintentional dictation errors.   Corena Herter, MD 03/26/23 [phone]

## 2023-03-26 NOTE — H&P (Addendum)
 History and Physical    Derek Lynch ZOX:[id number] DOB: 1940/02/04 DOA: 03/26/2023  PCP: Cheryll Dessert, FNP (Confirm with patient/family/NH records and if not entered, this has to be entered at [facility] point of entry) Patient coming from: Home  I have personally briefly reviewed patient's old medical records in [facility]  Chief Complaint: Cough, SOB, feeling weak and fell  HPI: Derek Lynch is a 83 y.o. male with medical history significant of HTN, HLD, IIDM, chronic ambulatory dysfunction, multiple always, presented with 2 days of cough shortness of breath wheezing malaise weakness and fall.  Patient lives by himself.  And this time he seems to have sore throat, dry cough since yesterday with increasing shortness of breath.  This morning he started to feel very unsteady and lightheaded and fell on right side and hit his right arm.  Denied any head injury, but unsure about whether he lost consciousness or not.  When EMS found him he was found to be hypoxic O2 saturation 77% and very confused, and EMS gave breathing treatment and put him on NRB.  ED Course: Hypoxic O2 saturation 88% on room air and stabilized on 5 L.  Not tachycardia but tachypneic and blood pressure 130/70.  CTA showed no PE but diffuse bilateral bronchial wall thickening and obesity throughout left upper lobe suspicious for aspiration pneumonia.  Cardiomegaly.  Blood work showed creatinine 1.4 compared to baseline 2.9, glucose 242, VBG 7.35/58/31, WBC 8.5, troponin 31> 30.  Lactic acid 3.6.  Review of Systems: As per HPI otherwise 14 point review of systems negative.    Past Medical History:  Diagnosis Date   Arthritis    Hyperlipidemia     Past Surgical History:  Procedure Laterality Date   COLONOSCOPY       reports that he has quit smoking. He has never used smokeless tobacco. He reports that he does not drink alcohol and does not use drugs.  Allergies  Allergen Reactions   Penicillin G Other (See  Comments)    Family History  Problem Relation Age of Onset   Heart attack Mother    Stroke Father    Pancreatic cancer Father      Prior to Admission medications   Medication Sig Start Date End Date Taking? Authorizing Provider  lovastatin (MEVACOR) 20 MG tablet Take 20 mg by mouth daily at 6 PM. 11/02/22  Yes [provider]  metFORMIN (GLUCOPHAGE) 1000 MG tablet Take 1 tablet by mouth 2 (two) times daily with a meal. 10/12/22  Yes [provider]  acetaminophen (TYLENOL) 325 MG tablet Take 650 mg by mouth every 6 (six) hours as needed for mild pain, moderate pain or headache.    [provider]  Ascorbic Acid (VITAMIN C) 1000 MG tablet Take 1,000 mg by mouth daily.    [provider]  aspirin EC 81 MG tablet Take 81 mg by mouth daily.    [provider]  cholecalciferol (VITAMIN D) 1000 units tablet Take 1,000 Units by mouth daily.    [provider]  clobetasol cream (TEMOVATE) 0.05 % Apply 1 Application topically 2 (two) times daily.    [provider]  furosemide (LASIX) 20 MG tablet Take 1 tablet (20 mg total) by mouth daily. 11/27/21   Rai, Ripudeep K, MD  lisinopril (ZESTRIL) 2.5 MG tablet Take 2.5 mg by mouth daily.    [provider]  metFORMIN (GLUCOPHAGE) 500 MG tablet Take by mouth daily with breakfast.    [provider]  pioglitazone (ACTOS) 15 MG tablet Take 15 mg by mouth daily.    [provider]    Physical Exam: Vitals:   03/26/23 1500 03/26/23 1550 03/26/23 1600 03/26/23 1630  BP: 120/84 118/76 134/67 (!) 125/102  Pulse:  99 98 84  Resp: (!) 35 (!) 26 (!) 27 (!) 23  Temp:      TempSrc:      SpO2:  94% 93% 95%  Weight:      Height:        Constitutional: NAD, calm, comfortable Vitals:   03/26/23 1500 03/26/23 1550 03/26/23 1600 03/26/23 1630  BP: 120/84 118/76 134/67 (!) 125/102  Pulse:  99 98 84  Resp: (!) 35 (!) 26 (!) 27 (!) 23  Temp:      TempSrc:      SpO2:   94% 93% 95%  Weight:      Height:       Eyes: PERRL, lids and conjunctivae normal ENMT: Mucous membranes are moist. Posterior pharynx clear of any exudate or lesions.Normal dentition.  Neck: normal, supple, no masses, no thyromegaly Respiratory: clear to auscultation bilaterally, diffused wheezing, scattered crackles, increasing respiratory effort. No accessory muscle use.  Cardiovascular: Regular rate and rhythm, no murmurs / rubs / gallops.  1+ extremity edema. 2+ pedal pulses. No carotid bruits.  Abdomen: no tenderness, no masses palpated. No hepatosplenomegaly. Bowel sounds positive.  Musculoskeletal: no clubbing / cyanosis. No joint deformity upper and lower extremities. Good ROM, no contractures. Normal muscle tone.  Skin: Rash on bilateral lower extremities, nontender.  Right arm somewhat swollen and tender to touch but distal radial pulses 2+ and capillary refill is good on right side as well Neurologic: CN 2-12 grossly intact. Sensation intact, DTR normal. Strength 5/5 in all 4.  Psychiatric: Normal judgment and insight. Alert and oriented x 3. Normal mood.     Labs on Admission: I have personally reviewed following labs and imaging studies  CBC: Recent Labs  Lab 03/26/23 1048  WBC 8.5  NEUTROABS 6.9  HGB 12.6*  HCT 37.8*  MCV 94.3  PLT 199   Basic Metabolic Panel: Recent Labs  Lab 03/26/23 1048  NA 137  K 4.3  CL 98  CO2 24  GLUCOSE 242*  BUN 18  CREATININE 1.43*  CALCIUM 8.7*   GFR: Estimated Creatinine Clearance: 41.3 mL/min (A) (by C-G formula based on SCr of 1.43 mg/dL (H)). Liver Function Tests: Recent Labs  Lab 03/26/23 1048  AST 99*  ALT 41  ALKPHOS 33*  BILITOT 0.7  PROT 7.9  ALBUMIN 3.5   Recent Labs  Lab 03/26/23 1048  LIPASE 24   No results for input(s): "AMMONIA" in the last 168 hours. Coagulation Profile: Recent Labs  Lab 03/26/23 1048  INR 1.1   Cardiac Enzymes: Recent Labs  Lab 03/26/23 1329  CKTOTAL 4,531*   BNP (last  3 results) No results for input(s): "PROBNP" in the last 8760 hours. HbA1C: No results for input(s): "HGBA1C" in the last 72 hours. CBG: No results for input(s): "GLUCAP" in the last 168 hours. Lipid Profile: No results for input(s): "CHOL", "HDL", "LDLCALC", "TRIG", "CHOLHDL", "LDLDIRECT" in the last 72 hours. Thyroid Function Tests: No results for input(s): "TSH", "T4TOTAL", "FREET4", "T3FREE", "THYROIDAB" in the last 72 hours. Anemia Panel: No results for input(s): "VITAMINB12", "FOLATE", "FERRITIN", "TIBC", "IRON", "RETICCTPCT" in the last 72 hours. Urine analysis: No results found for: "COLORURINE", "APPEARANCEUR", "LABSPEC", "PHURINE", "GLUCOSEU", "HGBUR", "BILIRUBINUR", "KETONESUR", "PROTEINUR", "UROBILINOGEN", "NITRITE", "LEUKOCYTESUR"  Radiological  Exams on Admission: CT Angio Chest Pulmonary Embolism (PE) W or WO Contrast Result Date: 03/26/2023 CLINICAL DATA:  Sepsis, unwitnessed fall altered mental status EXAM: CT ANGIOGRAPHY CHEST CT ABDOMEN AND PELVIS WITH CONTRAST TECHNIQUE: Multidetector CT imaging of the chest was performed using the standard protocol during bolus administration of intravenous contrast. Multiplanar CT image reconstructions and MIPs were obtained to evaluate the vascular anatomy. Multidetector CT imaging of the abdomen and pelvis was performed using the standard protocol during bolus administration of intravenous contrast. RADIATION DOSE REDUCTION: This exam was performed according to the departmental dose-optimization program which includes automated exposure control, adjustment of the mA and/or kV according to patient size and/or use of iterative reconstruction technique. CONTRAST:  80mL OMNIPAQUE IOHEXOL 350 MG/ML SOLN COMPARISON:  None Available. FINDINGS: CT CHEST ANGIOGRAM FINDINGS Cardiovascular: Satisfactory opacification of the pulmonary arteries to the segmental level. No evidence of pulmonary embolism. Cardiomegaly. Three-vessel coronary artery  calcifications enlargement of the main pulmonary artery measuring up to 3.5 cm in caliber. No pericardial effusion. Aortic atherosclerosis. Mediastinum/Nodes: No enlarged mediastinal, hilar, or axillary lymph nodes. Thyroid gland, trachea, and esophagus demonstrate no significant findings. Lungs/Pleura: Small bilateral pleural effusions. Diffuse bilateral bronchial wall thickening. Scattered heterogeneous and ground-glass airspace opacities throughout the left upper lobe (series 5, image 38). Dependent bibasilar scarring and atelectasis, particularly of the posterior lingula and lateral segment right middle lobe (series 5, image 86). Musculoskeletal: No chest wall abnormality. No acute osseous findings. Review of the MIP images confirms the above findings. CT ABDOMEN PELVIS FINDINGS Hepatobiliary: No solid liver abnormality is seen. Hepatic steatosis. No gallstones, gallbladder wall thickening, or biliary dilatation. Pancreas: Unremarkable. No pancreatic ductal dilatation or surrounding inflammatory changes. Spleen: Normal in size without significant abnormality. Adrenals/Urinary Tract: Adrenal glands are unremarkable. Nonobstructive inferior pole right renal calculus. Multiple simple, benign bilateral renal cortical cysts, for which no further follow-up or characterization is required. Bladder is unremarkable. Stomach/Bowel: Stomach is within normal limits. Appendix appears normal. No evidence of bowel wall thickening, distention, or inflammatory changes. Sigmoid diverticulosis. Vascular/Lymphatic: Aortic atherosclerosis. No enlarged abdominal or pelvic lymph nodes. Reproductive: No mass or other significant abnormality. Other: Large right, small left fat containing inguinal hernias. No ascites. Musculoskeletal: No acute or significant osseous findings. IMPRESSION: 1. Negative examination for pulmonary embolism. 2. Diffuse bilateral bronchial wall thickening and scattered heterogeneous and ground-glass airspace  opacities throughout the left upper lobe, consistent with infection or aspiration. 3. Small bilateral pleural effusions. Dependent bibasilar scarring and atelectasis. 4. Cardiomegaly and coronary artery disease. 5. Enlargement of the main pulmonary artery, as can be seen in pulmonary hypertension. 6. No acute CT findings of the abdomen or pelvis. 7. Hepatic steatosis. 8. Nonobstructive right nephrolithiasis. Aortic Atherosclerosis (ICD10-I70.0). Electronically Signed   By: Jearld Lesch M.D.   On: 03/26/2023 17:20   CT ABDOMEN PELVIS W CONTRAST Result Date: 03/26/2023 CLINICAL DATA:  Sepsis, unwitnessed fall altered mental status EXAM: CT ANGIOGRAPHY CHEST CT ABDOMEN AND PELVIS WITH CONTRAST TECHNIQUE: Multidetector CT imaging of the chest was performed using the standard protocol during bolus administration of intravenous contrast. Multiplanar CT image reconstructions and MIPs were obtained to evaluate the vascular anatomy. Multidetector CT imaging of the abdomen and pelvis was performed using the standard protocol during bolus administration of intravenous contrast. RADIATION DOSE REDUCTION: This exam was performed according to the departmental dose-optimization program which includes automated exposure control, adjustment of the mA and/or kV according to patient size and/or use of iterative reconstruction technique. CONTRAST:  80mL OMNIPAQUE IOHEXOL 350  MG/ML SOLN COMPARISON:  None Available. FINDINGS: CT CHEST ANGIOGRAM FINDINGS Cardiovascular: Satisfactory opacification of the pulmonary arteries to the segmental level. No evidence of pulmonary embolism. Cardiomegaly. Three-vessel coronary artery calcifications enlargement of the main pulmonary artery measuring up to 3.5 cm in caliber. No pericardial effusion. Aortic atherosclerosis. Mediastinum/Nodes: No enlarged mediastinal, hilar, or axillary lymph nodes. Thyroid gland, trachea, and esophagus demonstrate no significant findings. Lungs/Pleura: Small  bilateral pleural effusions. Diffuse bilateral bronchial wall thickening. Scattered heterogeneous and ground-glass airspace opacities throughout the left upper lobe (series 5, image 38). Dependent bibasilar scarring and atelectasis, particularly of the posterior lingula and lateral segment right middle lobe (series 5, image 86). Musculoskeletal: No chest wall abnormality. No acute osseous findings. Review of the MIP images confirms the above findings. CT ABDOMEN PELVIS FINDINGS Hepatobiliary: No solid liver abnormality is seen. Hepatic steatosis. No gallstones, gallbladder wall thickening, or biliary dilatation. Pancreas: Unremarkable. No pancreatic ductal dilatation or surrounding inflammatory changes. Spleen: Normal in size without significant abnormality. Adrenals/Urinary Tract: Adrenal glands are unremarkable. Nonobstructive inferior pole right renal calculus. Multiple simple, benign bilateral renal cortical cysts, for which no further follow-up or characterization is required. Bladder is unremarkable. Stomach/Bowel: Stomach is within normal limits. Appendix appears normal. No evidence of bowel wall thickening, distention, or inflammatory changes. Sigmoid diverticulosis. Vascular/Lymphatic: Aortic atherosclerosis. No enlarged abdominal or pelvic lymph nodes. Reproductive: No mass or other significant abnormality. Other: Large right, small left fat containing inguinal hernias. No ascites. Musculoskeletal: No acute or significant osseous findings. IMPRESSION: 1. Negative examination for pulmonary embolism. 2. Diffuse bilateral bronchial wall thickening and scattered heterogeneous and ground-glass airspace opacities throughout the left upper lobe, consistent with infection or aspiration. 3. Small bilateral pleural effusions. Dependent bibasilar scarring and atelectasis. 4. Cardiomegaly and coronary artery disease. 5. Enlargement of the main pulmonary artery, as can be seen in pulmonary hypertension. 6. No acute CT  findings of the abdomen or pelvis. 7. Hepatic steatosis. 8. Nonobstructive right nephrolithiasis. Aortic Atherosclerosis (ICD10-I70.0). Electronically Signed   By: Jearld Lesch M.D.   On: 03/26/2023 17:20   CT Head Wo Contrast Result Date: 03/26/2023 CLINICAL DATA:  Unwitnessed fall, unknown injury EXAM: CT HEAD WITHOUT CONTRAST CT MAXILLOFACIAL WITHOUT CONTRAST CT CERVICAL SPINE WITHOUT CONTRAST TECHNIQUE: Multidetector CT imaging of the head, cervical spine, and maxillofacial structures were performed using the standard protocol without intravenous contrast. Multiplanar CT image reconstructions of the cervical spine and maxillofacial structures were also generated. RADIATION DOSE REDUCTION: This exam was performed according to the departmental dose-optimization program which includes automated exposure control, adjustment of the mA and/or kV according to patient size and/or use of iterative reconstruction technique. COMPARISON:  11/20/2021 FINDINGS: CT HEAD FINDINGS Brain: No evidence of acute infarction, hemorrhage, hydrocephalus, extra-axial collection or mass lesion/mass effect. Periventricular white matter hypodensity. Unchanged bifrontal encephalomalacia (series 2, image 24). Vascular: No hyperdense vessel or unexpected calcification. CT FACIAL BONES FINDINGS Skull: Normal. Negative for fracture or focal lesion. Facial bones: No displaced fractures or dislocations. Sinuses/Orbits: No acute finding. Other: Soft tissue contusion and hematoma overlying the left brow, superficial orbit, left calvarium, and left skull base (series 2, image 20 13 7). Patient is edentulous. CT CERVICAL SPINE FINDINGS Alignment: Normal. Skull base and vertebrae: No acute fracture. No primary bone lesion or focal pathologic process. Soft tissues and spinal canal: No prevertebral fluid or swelling. No visible canal hematoma. Disc levels: Moderate multilevel cervical disc degenerative disease. Upper chest: Negative. Other: None.  IMPRESSION: 1. No acute intracranial pathology. 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 2. No displaced fractures or dislocations of the facial bones. 3. Soft tissue contusion and hematoma overlying the left brow, superficial orbit, left calvarium, and left skull base. No underlying fracture. 4. No fracture or static subluxation of the cervical spine. 5. Moderate multilevel cervical disc degenerative disease. Electronically Signed   By: Jearld Lesch M.D.   On: 03/26/2023 17:11   CT CERVICAL SPINE WO CONTRAST Result Date: 03/26/2023 CLINICAL DATA:  Unwitnessed fall, unknown injury EXAM: CT HEAD WITHOUT CONTRAST CT MAXILLOFACIAL WITHOUT CONTRAST CT CERVICAL SPINE WITHOUT CONTRAST TECHNIQUE: Multidetector CT imaging of the head, cervical spine, and maxillofacial structures were performed using the standard protocol without intravenous contrast. Multiplanar CT image reconstructions of the cervical spine and maxillofacial structures were also generated. RADIATION DOSE REDUCTION: This exam was performed according to the departmental dose-optimization program which includes automated exposure control, adjustment of the mA and/or kV according to patient size and/or use of iterative reconstruction technique. COMPARISON:  11/20/2021 FINDINGS: CT HEAD FINDINGS Brain: No evidence of acute infarction, hemorrhage, hydrocephalus, extra-axial collection or mass lesion/mass effect. Periventricular white matter hypodensity. Unchanged bifrontal encephalomalacia (series 2, image 24). Vascular: No hyperdense vessel or unexpected calcification. CT FACIAL BONES FINDINGS Skull: Normal. Negative for fracture or focal lesion. Facial bones: No displaced fractures or dislocations. Sinuses/Orbits: No acute finding. Other: Soft tissue contusion and hematoma overlying the left brow, superficial orbit, left calvarium, and left skull base (series 2, image 20 13 7). Patient is edentulous. CT CERVICAL SPINE  FINDINGS Alignment: Normal. Skull base and vertebrae: No acute fracture. No primary bone lesion or focal pathologic process. Soft tissues and spinal canal: No prevertebral fluid or swelling. No visible canal hematoma. Disc levels: Moderate multilevel cervical disc degenerative disease. Upper chest: Negative. Other: None. IMPRESSION: 1. No acute intracranial pathology. 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 2. No displaced fractures or dislocations of the facial bones. 3. Soft tissue contusion and hematoma overlying the left brow, superficial orbit, left calvarium, and left skull base. No underlying fracture. 4. No fracture or static subluxation of the cervical spine. 5. Moderate multilevel cervical disc degenerative disease. Electronically Signed   By: Jearld Lesch M.D.   On: 03/26/2023 17:11   CT Maxillofacial Wo Contrast Result Date: 03/26/2023 CLINICAL DATA:  Unwitnessed fall, unknown injury EXAM: CT HEAD WITHOUT CONTRAST CT MAXILLOFACIAL WITHOUT CONTRAST CT CERVICAL SPINE WITHOUT CONTRAST TECHNIQUE: Multidetector CT imaging of the head, cervical spine, and maxillofacial structures were performed using the standard protocol without intravenous contrast. Multiplanar CT image reconstructions of the cervical spine and maxillofacial structures were also generated. RADIATION DOSE REDUCTION: This exam was performed according to the departmental dose-optimization program which includes automated exposure control, adjustment of the mA and/or kV according to patient size and/or use of iterative reconstruction technique. COMPARISON:  11/20/2021 FINDINGS: CT HEAD FINDINGS Brain: No evidence of acute infarction, hemorrhage, hydrocephalus, extra-axial collection or mass lesion/mass effect. Periventricular white matter hypodensity. Unchanged bifrontal encephalomalacia (series 2, image 24). Vascular: No hyperdense vessel or unexpected calcification. CT FACIAL BONES FINDINGS Skull: Normal.  Negative for fracture or focal lesion. Facial bones: No displaced fractures or dislocations. Sinuses/Orbits: No acute finding. Other: Soft tissue contusion and hematoma overlying the left brow, superficial orbit, left calvarium, and left skull base (series 2, image 20 13 7). Patient is edentulous. CT CERVICAL SPINE FINDINGS Alignment: Normal. Skull base and vertebrae: No acute fracture. No primary bone lesion or focal pathologic process. Soft tissues and spinal canal: No prevertebral fluid or swelling. No  visible canal hematoma. Disc levels: Moderate multilevel cervical disc degenerative disease. Upper chest: Negative. Other: None. IMPRESSION: 1. No acute intracranial pathology. 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 2. No displaced fractures or dislocations of the facial bones. 3. Soft tissue contusion and hematoma overlying the left brow, superficial orbit, left calvarium, and left skull base. No underlying fracture. 4. No fracture or static subluxation of the cervical spine. 5. Moderate multilevel cervical disc degenerative disease. Electronically Signed   By: Jearld Lesch M.D.   On: 03/26/2023 17:11   US Venous Img Upper Uni Left Result Date: 03/26/2023 CLINICAL DATA:  swelling, eval for dvt EXAM: LEFT UPPER EXTREMITY VENOUS DOPPLER ULTRASOUND TECHNIQUE: Gray-scale sonography with graded compression, as well as color Doppler and duplex ultrasound were performed to evaluate the upper extremity deep venous system from the level of the subclavian vein and including the jugular, axillary, basilic, radial, ulnar and upper cephalic vein. Spectral Doppler was utilized to evaluate flow at rest and with distal augmentation maneuvers. COMPARISON:  CTA chest 03/26/2023 FINDINGS: VENOUS Normal compressibility of the LEFT internal jugular, subclavian, axillary, cephalic, basilic, brachial, radial and ulnar veins. No filling defects to suggest DVT on grayscale or color Doppler imaging.  Doppler waveforms show normal direction of venous flow, normal respiratory plasticity and response to augmentation. Limited views of the contralateral subclavian vein are unremarkable. OTHER No evidence of superficial thrombophlebitis or abnormal fluid collection. Limitations: none IMPRESSION: No evidence of DVT or superficial thrombophlebitis within the LEFT upper extremity. Roanna Banning, MD [facility] Electronically Signed   By: Roanna Banning M.D.   On: 03/26/2023 16:00   DG Shoulder Left Result Date: 03/26/2023 CLINICAL DATA:  Left arm pain status post fall EXAM: LEFT HUMERUS - 2 VIEW; LEFT SHOULDER - 3 VIEW COMPARISON:  None Available. FINDINGS: There is no evidence of fracture or dislocation. Degenerative changes of the shoulder. Soft tissues are unremarkable. IMPRESSION: No acute fracture or dislocation. Degenerative changes of the shoulder. Electronically Signed   By: Agustin Cree M.D.   On: 03/26/2023 13:51   DG Humerus Left Result Date: 03/26/2023 CLINICAL DATA:  Left arm pain status post fall EXAM: LEFT HUMERUS - 2 VIEW; LEFT SHOULDER - 3 VIEW COMPARISON:  None Available. FINDINGS: There is no evidence of fracture or dislocation. Degenerative changes of the shoulder. Soft tissues are unremarkable. IMPRESSION: No acute fracture or dislocation. Degenerative changes of the shoulder. Electronically Signed   By: Agustin Cree M.D.   On: 03/26/2023 13:51   DG Chest Port 1 View Result Date: 03/26/2023 CLINICAL DATA:  Unwitnessed fall with right arm pain. Hypoxia and new altered mental status EXAM: PORTABLE CHEST 1 VIEW COMPARISON:  Chest radiograph dated 11/20/2021 FINDINGS: Low lung volumes with bronchovascular crowding. Diffuse bilateral interstitial and patchy opacities. Blunting of the bilateral costophrenic angles. No pneumothorax. Similar enlarged cardiomediastinal silhouette. No radiographic finding of acute displaced fracture. IMPRESSION: 1. Diffuse  bilateral interstitial and patchy opacities, which may represent atelectasis, pulmonary edema, or multifocal infection. 2. Blunting of the bilateral costophrenic angles, which may represent small pleural effusions. Electronically Signed   By: Agustin Cree M.D.   On: 03/26/2023 13:36    EKG: Independently reviewed.  Sinus rhythm, no acute ST changes.  Assessment/Plan Principal Problem:   Hypoxia Active Problems:   Fall   Influenza A with pneumonia   Acute metabolic encephalopathy  (please populate well all problems here in Problem List. (For example, if patient is on BP meds  at home and you resume or decide to hold them, it is a problem that needs to be her. Same for CAD, COPD, HLD and so on)  Sepsis with acute endorgan damage Aspiration pneumonia Influenza A pneumonia Acute bronchitis -Sepsis evidenced by new onset of hypoxia, and elevated lactic acid, source infection likely combined influenza A pneumonia as well as aspiration pneumonia on left upper lobe -Continue fluid resuscitation -Tamiflu -Unasyn for aspiration pneumonia, suspect patient aspirated after he fell and possible syncope episode as well. -Breathing treatment, p.o. steroid for acute bronchitis, Pulmicort and DuoNebs -Incentive spirometry and flutter valve  Acute hypoxic respiratory failure -Secondary to influenza A pneumonia and aspiration pneumonia, and possible concurrent acute bronchitis management as above -Arcet DDx, patient appears to be euvolemic to mild fluid overload and CTA does show signs of cardiomegaly and patient does take Lasix but he cannot confirm me that he was never diagnosed with CHF.  Will order echocardiogram.  Hold off Lasix as patient is on IV fluid for rhabdomyolysis  Questionable syncope -Telemonitoring, echocardiogram -Recheck orthostatic vital signs -PT evaluation  Rhabdomyolysis -Likely from dull injury on right arm, currently there is no signs of compartment syndrome -Hydration and trend  CK level -UA pending for myoglobin  IIDM -Hold off metformin for 2 days -SSI  AKI -Likely prerenal secondary to sepsis and rhabdomyolysis, hopefully not ATN -IV fluid then reevaluate  Venous stasis dermatitis -Topical steroid  DVT prophylaxis: Lovenox Code Status: DNR Family Communication: None at bedside Disposition Plan: Patient is sick with hypoxia and sepsis from combined effect of influenza A pneumonia as well as aspiration pneumonia requiring close monitoring of clinical progress, expect more than 2 midnight hospital stay Consults called: None Admission status: Telemetry admission   Emeline General MD [facility] Pager [phone]  03/26/2023, 5:33 PM

## 2023-03-26 NOTE — Sepsis Progress Note (Signed)
 Sepsis protocol monitored by eLink ?

## 2023-03-27 ENCOUNTER — Inpatient Hospital Stay

## 2023-03-27 ENCOUNTER — Inpatient Hospital Stay (HOSPITAL_COMMUNITY)
Admit: 2023-03-27 | Discharge: 2023-03-27 | Disposition: A | Discharge status: Home / Self Care | Attending: Internal Medicine

## 2023-03-27 DIAGNOSIS — I5031 Acute diastolic (congestive) heart failure: Secondary | ICD-10-CM | POA: Diagnosis not present

## 2023-03-27 DIAGNOSIS — J9601 Acute respiratory failure with hypoxia: Secondary | ICD-10-CM | POA: Diagnosis not present

## 2023-03-27 LAB — BASIC METABOLIC PANEL
Anion gap: 12 (ref 5–15)
BUN: 19 mg/dL (ref 8–23)
CO2: 22 mmol/L (ref 22–32)
Calcium: 8 mg/dL — ABNORMAL LOW (ref 8.9–10.3)
Chloride: 98 mmol/L (ref 98–111)
Creatinine, Ser: 1.36 mg/dL — ABNORMAL HIGH (ref 0.61–1.24)
GFR, Estimated: 53 mL/min — ABNORMAL LOW (ref >60)
Glucose, Bld: 268 mg/dL — ABNORMAL HIGH (ref 70–99)
Potassium: 4.2 mmol/L (ref 3.5–5.1)
Sodium: 132 mmol/L — ABNORMAL LOW (ref 135–145)

## 2023-03-27 LAB — BLOOD GAS, VENOUS
Acid-Base Excess: 4.7 mmol/L — ABNORMAL HIGH (ref 0.0–2.0)
Bicarbonate: 32 mmol/L — ABNORMAL HIGH (ref 20.0–28.0)
O2 Saturation: 20.5 %
Patient temperature: 37
pCO2, Ven: 58 mmHg (ref 44–60)
pH, Ven: 7.35 (ref 7.25–7.43)

## 2023-03-27 LAB — CBC
HCT: 33.7 % — ABNORMAL LOW (ref 39.0–52.0)
Hemoglobin: 11.5 g/dL — ABNORMAL LOW (ref 13.0–17.0)
MCH: 31.3 pg (ref 26.0–34.0)
MCHC: 34.1 g/dL (ref 30.0–36.0)
MCV: 91.6 fL (ref 80.0–100.0)
Platelets: 176 10*3/uL (ref 150–400)
RBC: 3.68 MIL/uL — ABNORMAL LOW (ref 4.22–5.81)
RDW: 13.6 % (ref 11.5–15.5)
WBC: 6.5 10*3/uL (ref 4.0–10.5)
nRBC: 0 % (ref 0.0–0.2)

## 2023-03-27 LAB — ECHOCARDIOGRAM COMPLETE
AR max vel: 1.89 cm2
AV Peak grad: 13.8 mmHg
Ao pk vel: 1.86 m/s
Area-P 1/2: 3.77 cm2
Height: 64 in
S' Lateral: 2.3 cm
Weight: 3118.19 [oz_av]

## 2023-03-27 LAB — GLUCOSE, CAPILLARY
Glucose-Capillary: 262 mg/dL — ABNORMAL HIGH (ref 70–99)
Glucose-Capillary: 207 mg/dL — ABNORMAL HIGH (ref 70–99)
Glucose-Capillary: 201 mg/dL — ABNORMAL HIGH (ref 70–99)
Glucose-Capillary: 229 mg/dL — ABNORMAL HIGH (ref 70–99)

## 2023-03-27 LAB — CK: Total CK: 6211 U/L — ABNORMAL HIGH (ref 49–397)

## 2023-03-27 MED ORDER — IPRATROPIUM-ALBUTEROL 0.5-2.5 (3) MG/3ML IN SOLN
3.0000 mL | RESPIRATORY_TRACT | Status: DC | PRN
  Filled 2023-03-27: qty 3

## 2023-03-27 NOTE — Evaluation (Signed)
 Physical Therapy Evaluation Patient Details Name: Derek Lynch MRN: [id number] DOB: 1940-10-06 Today's Date: 03/27/2023  History of Present Illness  Derek Lynch is a 83 y.o. male with medical history significant of HTN, HLD, IIDM, chronic ambulatory dysfunction, multiple always, presented with 2 days of cough shortness of breath wheezing malaise weakness and fall.   Clinical Impression  Pt received in Semi-Fowler's position and agreeable to therapy.  Pt slouched into the bed and off centered.  Pt agreeable to working with therapy, and performed bed level exercises, however was unable to give prior living situation and was tangential majority of the time speaking about old cars.  Pt was able to perform bridging in order to come up higher in the bed, but ultimately required +2 assistance from therapist and nurse to get up in bed for dinner.  Pt requested to stop therapy and eat towards conclusion of the session.   Pt to be re-assessed for cognition levels to see if pt is less confused and is more willing to work with therapy.  Pt left with all needs met and call bell within reach.         If plan is discharge home, recommend the following: Two people to help with walking and/or transfers;Two people to help with bathing/dressing/bathroom;Help with stairs or ramp for entrance;Assist for transportation;Assistance with cooking/housework   Can travel by private vehicle   No    Equipment Recommendations Other (comment) (TBD by next venue of care.)  Recommendations for Other Services       Functional Status Assessment Patient has had a recent decline in their functional status and demonstrates the ability to make significant improvements in function in a reasonable and predictable amount of time.     Precautions / Restrictions Precautions Precautions: Fall Restrictions Weight Bearing Restrictions Per Provider Order: No      Mobility  Bed Mobility               General bed  mobility comments: pt ultimately deferred any mobility other than sliding up in bed for dinner.    Transfers                        Ambulation/Gait                  Stairs            Wheelchair Mobility     Tilt Bed    Modified Rankin (Stroke Patients Only)       Balance                                             Pertinent Vitals/Pain Pain Assessment Pain Assessment: Faces Faces Pain Scale: Hurts even more Pain Location: L Shoulder Pain Descriptors / Indicators: Aching Pain Intervention(s): Limited activity within patient's tolerance, Monitored during session, Repositioned    Home Living Family/patient expects to be discharged to:: Private residence Living Arrangements: Alone   Type of Home: Apartment Home Access: Level entry       Home Layout: One level   Additional Comments: Information pulled from prior session (11/23/21) as pt is unable to give prior information at evaluation.    Prior Function Prior Level of Function : Patient poor historian/Family not available  Extremity/Trunk Assessment   Upper Extremity Assessment Upper Extremity Assessment: Generalized weakness    Lower Extremity Assessment Lower Extremity Assessment: Generalized weakness       Communication   Communication Factors Affecting Communication: Hearing impaired    Cognition Arousal: Alert     PT - Cognitive impairments: No family/caregiver present to determine baseline, Difficult to assess                                 Cueing       General Comments      Exercises Total Joint Exercises Ankle Circles/Pumps: AROM, Strengthening, Both, 10 reps, Supine Quad Sets: AROM, Strengthening, Both, 10 reps, Supine Gluteal Sets: AROM, Strengthening, Both, 10 reps, Supine Bridges: AROM, Both, 5 reps, Supine   Assessment/Plan    PT Assessment Patient needs continued PT services  PT Problem List  Decreased strength;Decreased activity tolerance;Decreased balance;Decreased mobility;Decreased knowledge of use of DME;Decreased safety awareness       PT Treatment Interventions DME instruction;Gait training;Stair training;Functional mobility training;Therapeutic activities;Therapeutic exercise;Balance training;Neuromuscular re-education    PT Goals (Current goals can be found in the Care Plan section)  Acute Rehab PT Goals PT Goal Formulation: Patient unable to participate in goal setting    Frequency Min 2X/week     Co-evaluation               AM-PAC PT "6 Clicks" Mobility  Outcome Measure Help needed turning from your back to your side while in a flat bed without using bedrails?: A Lot Help needed moving from lying on your back to sitting on the side of a flat bed without using bedrails?: A Lot Help needed moving to and from a bed to a chair (including a wheelchair)?: A Lot Help needed standing up from a chair using your arms (e.g., wheelchair or bedside chair)?: A Lot Help needed to walk in hospital room?: Total Help needed climbing 3-5 steps with a railing? : Total 6 Click Score: 10    End of Session Equipment Utilized During Treatment: Oxygen Activity Tolerance: Patient limited by pain Patient left: in bed;with bed alarm set;with nursing/sitter in room Nurse Communication: Mobility status PT Visit Diagnosis: Unsteadiness on feet (R26.81);Other abnormalities of gait and mobility (R26.89);Repeated falls (R29.6);Muscle weakness (generalized) (M62.81);History of falling (Z91.81);Difficulty in walking, not elsewhere classified (R26.2)    Time: 9604-5409 PT Time Calculation (min) (ACUTE ONLY): 15 min   Charges:   PT Evaluation $PT Eval Moderate Complexity: 1 Mod   PT General Charges $$ ACUTE PT VISIT: 1 Visit         Nolon Bussing, PT, DPT Physical Therapist - [facility]  03/27/23, 5:38 PM

## 2023-03-27 NOTE — Progress Notes (Signed)
  Echocardiogram 2D Echocardiogram has been performed.  Lenor Coffin 03/27/2023, 9:01 AM

## 2023-03-27 NOTE — Progress Notes (Signed)
 PROGRESS NOTE    Derek Lynch  UXL:[id number] DOB: 1940/12/06 DOA: 03/26/2023 PCP: Cheryll Dessert, FNP   Assessment & Plan:   Principal Problem:   Hypoxia Active Problems:   Fall   Influenza A with pneumonia   Acute metabolic encephalopathy   CHF (congestive heart failure) (HCC)  Assessment and Plan: Sepsis: with acute endorgan damage. Met criteria w/ hypoxia, tachypnea, elevated lactic acid & likely secondary to pneumonia. Continue on IV unasyn, bronchodilators & encourage incentive spirometry. Continue on BiPAP and wean as tolerated.   Influenza pneumonia: continue on tamiflu, steroids and supportive care. Droplet precautions  Aspiration pneumonia: continue on IV rocephin, flagyl, bronchodilators & encourage incentive spirometry   Acute hypoxic respiratory failure: likely secondary to pneumonia. Continue on BiPAP and wean as tolerated. CTA chest neg for PE.   Questionable syncope: echo ordered. PT/OT consulted    Rhabdomyolysis: CK is trending up. Holding statin.    DM2: likely poorly controlled, HbA1c 8.5. Continue on SSI w/ accuchecks    AKI: secondary to sepsis & rhabdomyolysis. 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 Would benefit from weight loss     DVT prophylaxis: lovenox  Code Status: DNR Family Communication: Disposition Plan: depends on PT/OT recs   Level of care: Progressive  Status is: Inpatient Remains inpatient appropriate because: severity of illness    Consultants:    Procedures:   Antimicrobials: rocephin, flagyl   Subjective: Pt c/o shortness of breath   Objective: Vitals:   03/27/23 0244 03/27/23 0617 03/27/23 0748 03/27/23 0750  BP:  (!) 152/92 (!) 141/91   Pulse:  76 76 75  Resp:  18  (!) 30  Temp:  97.7 F (36.5 C) 97.7 F (36.5 C)   TempSrc:  Oral    SpO2: 94% 100% 99% 96%  Weight:      Height:        Intake/Output Summary (Last  24 hours) at 03/27/2023 0854 Last data filed at 03/27/2023 0356 Gross per 24 hour  Intake 390.85 ml  Output 600 ml  Net -209.15 ml   Filed Weights   03/26/23 1054  Weight: 88.4 kg    Examination:  General exam: Appears calm and comfortable  Respiratory system: course breath sounds b/l  Cardiovascular system: S1 & S2 +. No rubs, gallops or clicks.  Gastrointestinal system: Abdomen is obese, soft and nontender.  Normal bowel sounds heard. Central nervous system: Alert and awake. Moves all extremities Psychiatry: Judgement and insight appears at baseline. Flat mood and affect    Data Reviewed: I have personally reviewed following labs and imaging studies  CBC: Recent Labs  Lab 03/26/23 1048 03/27/23 0428  WBC 8.5 6.5  NEUTROABS 6.9  --   HGB 12.6* 11.5*  HCT 37.8* 33.7*  MCV 94.3 91.6  PLT 199 176   Basic Metabolic Panel: Recent Labs  Lab 03/26/23 1048 03/27/23 0428  NA 137 132*  K 4.3 4.2  CL 98 98  CO2 24 22  GLUCOSE 242* 268*  BUN 18 19  CREATININE 1.43* 1.36*  CALCIUM 8.7* 8.0*   GFR: Estimated Creatinine Clearance: 43.4 mL/min (A) (by C-G formula based on SCr of 1.36 mg/dL (H)). Liver Function Tests: Recent Labs  Lab 03/26/23 1048  AST 99*  ALT 41  ALKPHOS 33*  BILITOT 0.7  PROT 7.9  ALBUMIN 3.5   Recent Labs  Lab 03/26/23 1048  LIPASE 24  No results for input(s): "AMMONIA" in the last 168 hours. Coagulation Profile: Recent Labs  Lab 03/26/23 1048  INR 1.1   Cardiac Enzymes: Recent Labs  Lab 03/26/23 1329 03/27/23 0428  CKTOTAL 4,531* 6,211*   BNP (last 3 results) No results for input(s): "PROBNP" in the last 8760 hours. HbA1C: Recent Labs    03/26/23 1048  HGBA1C 8.5*   CBG: Recent Labs  Lab 03/26/23 2103 03/27/23 0750  GLUCAP 180* 262*   Lipid Profile: No results for input(s): "CHOL", "HDL", "LDLCALC", "TRIG", "CHOLHDL", "LDLDIRECT" in the last 72 hours. Thyroid Function Tests: No results for input(s): "TSH",  "T4TOTAL", "FREET4", "T3FREE", "THYROIDAB" in the last 72 hours. Anemia Panel: No results for input(s): "VITAMINB12", "FOLATE", "FERRITIN", "TIBC", "IRON", "RETICCTPCT" in the last 72 hours. Sepsis Labs: Recent Labs  Lab 03/26/23 1048 03/26/23 1329  LATICACIDVEN 3.6* 2.2*    Recent Results (from the past 240 hours)  Blood Culture (routine x 2)     Status: None (Preliminary result)   Collection Time: 03/26/23 10:48 AM   Specimen: Right Antecubital; Blood  Result Value Ref Range Status   Specimen Description RIGHT ANTECUBITAL  Final   Special Requests   Final    Blood Culture results may not be optimal due to an inadequate volume of blood received in culture bottles   Culture   Final    NO GROWTH < 24 HOURS Performed at [facility], [address], [place], [place] [address]    Report Status PENDING  Incomplete  Blood Culture (routine x 2)     Status: None (Preliminary result)   Collection Time: 03/26/23 10:48 AM   Specimen: BLOOD  Result Value Ref Range Status   Specimen Description BLOOD BLOOD RIGHT HAND  Final   Special Requests   Final    Blood Culture results may not be optimal due to an inadequate volume of blood received in culture bottles   Culture   Final    NO GROWTH < 24 HOURS Performed at [facility], [address], [place], [place] [address]    Report Status PENDING  Incomplete  Resp panel by RT-PCR (RSV, Flu A&B, Covid) Anterior Nasal Swab     Status: Abnormal   Collection Time: 03/26/23 10:48 AM   Specimen: Anterior Nasal Swab  Result Value Ref Range Status   SARS Coronavirus 2 by RT PCR NEGATIVE NEGATIVE Final    Comment: (NOTE) SARS-CoV-2 target nucleic acids are NOT DETECTED.  The SARS-CoV-2 RNA is generally detectable in upper respiratory specimens during the acute phase of infection. The lowest concentration of SARS-CoV-2 viral copies this assay can detect is 138 copies/mL. A negative result does not preclude  SARS-Cov-2 infection and should not be used as the sole basis for treatment or other patient management decisions. A negative result may occur with  improper specimen collection/handling, submission of specimen other than nasopharyngeal swab, presence of viral mutation(s) within the areas targeted by this assay, and inadequate number of viral copies(<138 copies/mL). A negative result must be combined with clinical observations, patient history, and epidemiological information. The expected result is Negative.  Fact Sheet for Patients:  BloggerCourse.com  Fact Sheet for Healthcare Providers:  SeriousBroker.it  This test is no t yet approved or cleared by the [place] FDA and  has been authorized for detection and/or diagnosis of SARS-CoV-2 by FDA under an Emergency Use Authorization (EUA). This EUA will remain  in effect (meaning this test can be used) for the duration of the  COVID-19 declaration under Section 564(b)(1) of the Act, 21 U.S.C.section 360bbb-3(b)(1), unless the authorization is terminated  or revoked sooner.       Influenza A by PCR POSITIVE (A) NEGATIVE Final   Influenza B by PCR NEGATIVE NEGATIVE Final    Comment: (NOTE) The Xpert Xpress SARS-CoV-2/FLU/RSV plus assay is intended as an aid in the diagnosis of influenza from Nasopharyngeal swab specimens and should not be used as a sole basis for treatment. Nasal washings and aspirates are unacceptable for Xpert Xpress SARS-CoV-2/FLU/RSV testing.  Fact Sheet for Patients: BloggerCourse.com  Fact Sheet for Healthcare Providers: SeriousBroker.it  This test is not yet approved or cleared by the [place] FDA and has been authorized for detection and/or diagnosis of SARS-CoV-2 by FDA under an Emergency Use Authorization (EUA). This EUA will remain in effect (meaning this test can be used) for the duration of  the COVID-19 declaration under Section 564(b)(1) of the Act, 21 U.S.C. section 360bbb-3(b)(1), unless the authorization is terminated or revoked.     Resp Syncytial Virus by PCR NEGATIVE NEGATIVE Final    Comment: (NOTE) Fact Sheet for Patients: BloggerCourse.com  Fact Sheet for Healthcare Providers: SeriousBroker.it  This test is not yet approved or cleared by the [place] FDA and has been authorized for detection and/or diagnosis of SARS-CoV-2 by FDA under an Emergency Use Authorization (EUA). This EUA will remain in effect (meaning this test can be used) for the duration of the COVID-19 declaration under Section 564(b)(1) of the Act, 21 U.S.C. section 360bbb-3(b)(1), unless the authorization is terminated or revoked.  Performed at [facility], [address] [place], [place] [address]   MRSA Next Gen by PCR, Nasal     Status: None   Collection Time: 03/26/23  3:49 PM   Specimen: Nasal Mucosa; Nasal Swab  Result Value Ref Range Status   MRSA by PCR Next Gen NOT DETECTED NOT DETECTED Final    Comment: (NOTE) The GeneXpert MRSA Assay (FDA approved for NASAL specimens only), is one component of a comprehensive MRSA colonization surveillance program. It is not intended to diagnose MRSA infection nor to guide or monitor treatment for MRSA infections. Test performance is not FDA approved in patients less than 38 years old. Performed at [facility], [address] [place], [place] [address]          Radiology Studies: DG Chest 1 View Result Date: 03/27/2023 CLINICAL DATA:  CHF. EXAM: CHEST  1 VIEW COMPARISON:  03/26/2023 FINDINGS: The cardio pericardial silhouette is enlarged. Lung apices obscured by the patient's lower face. There is pulmonary vascular congestion without overt pulmonary edema. Basilar atelectasis with small right pleural effusion. Bones are demineralized. Telemetry leads overlie  the chest. IMPRESSION: Cardiomegaly with vascular congestion and small right pleural effusion. Electronically Signed   By: Kennith Center M.D.   On: 03/27/2023 06:57   DG Chest 1 View Result Date: 03/26/2023 CLINICAL DATA:  Congestive heart failure.  Fall. EXAM: CHEST  1 VIEW COMPARISON:  Radiograph earlier today, chest CT earlier today FINDINGS: The heart is enlarged but stable. Aortic atherosclerosis. Pleural effusions are better demonstrated on CT. No pneumothorax or acute airspace disease. No displaced rib fractures. IMPRESSION: Cardiomegaly. Pleural effusions are better demonstrated on CT. Electronically Signed   By: Narda Rutherford M.D.   On: 03/26/2023 23:37   CT Angio Chest Pulmonary Embolism (PE) W or WO Contrast Result Date: 03/26/2023 CLINICAL DATA:  Sepsis, unwitnessed fall altered mental status EXAM: CT ANGIOGRAPHY CHEST CT ABDOMEN  AND PELVIS WITH CONTRAST TECHNIQUE: Multidetector CT imaging of the chest was performed using the standard protocol during bolus administration of intravenous contrast. Multiplanar CT image reconstructions and MIPs were obtained to evaluate the vascular anatomy. Multidetector CT imaging of the abdomen and pelvis was performed using the standard protocol during bolus administration of intravenous contrast. RADIATION DOSE REDUCTION: This exam was performed according to the departmental dose-optimization program which includes automated exposure control, adjustment of the mA and/or kV according to patient size and/or use of iterative reconstruction technique. CONTRAST:  80mL OMNIPAQUE IOHEXOL 350 MG/ML SOLN COMPARISON:  None Available. FINDINGS: CT CHEST ANGIOGRAM FINDINGS Cardiovascular: Satisfactory opacification of the pulmonary arteries to the segmental level. No evidence of pulmonary embolism. Cardiomegaly. Three-vessel coronary artery calcifications enlargement of the main pulmonary artery measuring up to 3.5 cm in caliber. No pericardial effusion. Aortic  atherosclerosis. Mediastinum/Nodes: No enlarged mediastinal, hilar, or axillary lymph nodes. Thyroid gland, trachea, and esophagus demonstrate no significant findings. Lungs/Pleura: Small bilateral pleural effusions. Diffuse bilateral bronchial wall thickening. Scattered heterogeneous and ground-glass airspace opacities throughout the left upper lobe (series 5, image 38). Dependent bibasilar scarring and atelectasis, particularly of the posterior lingula and lateral segment right middle lobe (series 5, image 86). Musculoskeletal: No chest wall abnormality. No acute osseous findings. Review of the MIP images confirms the above findings. CT ABDOMEN PELVIS FINDINGS Hepatobiliary: No solid liver abnormality is seen. Hepatic steatosis. No gallstones, gallbladder wall thickening, or biliary dilatation. Pancreas: Unremarkable. No pancreatic ductal dilatation or surrounding inflammatory changes. Spleen: Normal in size without significant abnormality. Adrenals/Urinary Tract: Adrenal glands are unremarkable. Nonobstructive inferior pole right renal calculus. Multiple simple, benign bilateral renal cortical cysts, for which no further follow-up or characterization is required. Bladder is unremarkable. Stomach/Bowel: Stomach is within normal limits. Appendix appears normal. No evidence of bowel wall thickening, distention, or inflammatory changes. Sigmoid diverticulosis. Vascular/Lymphatic: Aortic atherosclerosis. No enlarged abdominal or pelvic lymph nodes. Reproductive: No mass or other significant abnormality. Other: Large right, small left fat containing inguinal hernias. No ascites. Musculoskeletal: No acute or significant osseous findings. IMPRESSION: 1. Negative examination for pulmonary embolism. 2. Diffuse bilateral bronchial wall thickening and scattered heterogeneous and ground-glass airspace opacities throughout the left upper lobe, consistent with infection or aspiration. 3. Small bilateral pleural effusions.  Dependent bibasilar scarring and atelectasis. 4. Cardiomegaly and coronary artery disease. 5. Enlargement of the main pulmonary artery, as can be seen in pulmonary hypertension. 6. No acute CT findings of the abdomen or pelvis. 7. Hepatic steatosis. 8. Nonobstructive right nephrolithiasis. Aortic Atherosclerosis (ICD10-I70.0). Electronically Signed   By: Jearld Lesch M.D.   On: 03/26/2023 17:20   CT ABDOMEN PELVIS W CONTRAST Result Date: 03/26/2023 CLINICAL DATA:  Sepsis, unwitnessed fall altered mental status EXAM: CT ANGIOGRAPHY CHEST CT ABDOMEN AND PELVIS WITH CONTRAST TECHNIQUE: Multidetector CT imaging of the chest was performed using the standard protocol during bolus administration of intravenous contrast. Multiplanar CT image reconstructions and MIPs were obtained to evaluate the vascular anatomy. Multidetector CT imaging of the abdomen and pelvis was performed using the standard protocol during bolus administration of intravenous contrast. RADIATION DOSE REDUCTION: This exam was performed according to the departmental dose-optimization program which includes automated exposure control, adjustment of the mA and/or kV according to patient size and/or use of iterative reconstruction technique. CONTRAST:  80mL OMNIPAQUE IOHEXOL 350 MG/ML SOLN COMPARISON:  None Available. FINDINGS: CT CHEST ANGIOGRAM FINDINGS Cardiovascular: Satisfactory opacification of the pulmonary arteries to the segmental level. No evidence of pulmonary embolism. Cardiomegaly. Three-vessel coronary artery  calcifications enlargement of the main pulmonary artery measuring up to 3.5 cm in caliber. No pericardial effusion. Aortic atherosclerosis. Mediastinum/Nodes: No enlarged mediastinal, hilar, or axillary lymph nodes. Thyroid gland, trachea, and esophagus demonstrate no significant findings. Lungs/Pleura: Small bilateral pleural effusions. Diffuse bilateral bronchial wall thickening. Scattered heterogeneous and ground-glass airspace  opacities throughout the left upper lobe (series 5, image 38). Dependent bibasilar scarring and atelectasis, particularly of the posterior lingula and lateral segment right middle lobe (series 5, image 86). Musculoskeletal: No chest wall abnormality. No acute osseous findings. Review of the MIP images confirms the above findings. CT ABDOMEN PELVIS FINDINGS Hepatobiliary: No solid liver abnormality is seen. Hepatic steatosis. No gallstones, gallbladder wall thickening, or biliary dilatation. Pancreas: Unremarkable. No pancreatic ductal dilatation or surrounding inflammatory changes. Spleen: Normal in size without significant abnormality. Adrenals/Urinary Tract: Adrenal glands are unremarkable. Nonobstructive inferior pole right renal calculus. Multiple simple, benign bilateral renal cortical cysts, for which no further follow-up or characterization is required. Bladder is unremarkable. Stomach/Bowel: Stomach is within normal limits. Appendix appears normal. No evidence of bowel wall thickening, distention, or inflammatory changes. Sigmoid diverticulosis. Vascular/Lymphatic: Aortic atherosclerosis. No enlarged abdominal or pelvic lymph nodes. Reproductive: No mass or other significant abnormality. Other: Large right, small left fat containing inguinal hernias. No ascites. Musculoskeletal: No acute or significant osseous findings. IMPRESSION: 1. Negative examination for pulmonary embolism. 2. Diffuse bilateral bronchial wall thickening and scattered heterogeneous and ground-glass airspace opacities throughout the left upper lobe, consistent with infection or aspiration. 3. Small bilateral pleural effusions. Dependent bibasilar scarring and atelectasis. 4. Cardiomegaly and coronary artery disease. 5. Enlargement of the main pulmonary artery, as can be seen in pulmonary hypertension. 6. No acute CT findings of the abdomen or pelvis. 7. Hepatic steatosis. 8. Nonobstructive right nephrolithiasis. Aortic Atherosclerosis  (ICD10-I70.0). Electronically Signed   By: Jearld Lesch M.D.   On: 03/26/2023 17:20   CT Head Wo Contrast Result Date: 03/26/2023 CLINICAL DATA:  Unwitnessed fall, unknown injury EXAM: CT HEAD WITHOUT CONTRAST CT MAXILLOFACIAL WITHOUT CONTRAST CT CERVICAL SPINE WITHOUT CONTRAST TECHNIQUE: Multidetector CT imaging of the head, cervical spine, and maxillofacial structures were performed using the standard protocol without intravenous contrast. Multiplanar CT image reconstructions of the cervical spine and maxillofacial structures were also generated. RADIATION DOSE REDUCTION: This exam was performed according to the departmental dose-optimization program which includes automated exposure control, adjustment of the mA and/or kV according to patient size and/or use of iterative reconstruction technique. COMPARISON:  11/20/2021 FINDINGS: CT HEAD FINDINGS Brain: No evidence of acute infarction, hemorrhage, hydrocephalus, extra-axial collection or mass lesion/mass effect. Periventricular white matter hypodensity. Unchanged bifrontal encephalomalacia (series 2, image 24). Vascular: No hyperdense vessel or unexpected calcification. CT FACIAL BONES FINDINGS Skull: Normal. Negative for fracture or focal lesion. Facial bones: No displaced fractures or dislocations. Sinuses/Orbits: No acute finding. Other: Soft tissue contusion and hematoma overlying the left brow, superficial orbit, left calvarium, and left skull base (series 2, image 20 13 7). Patient is edentulous. CT CERVICAL SPINE FINDINGS Alignment: Normal. Skull base and vertebrae: No acute fracture. No primary bone lesion or focal pathologic process. Soft tissues and spinal canal: No prevertebral fluid or swelling. No visible canal hematoma. Disc levels: Moderate multilevel cervical disc degenerative disease. Upper chest: Negative. Other: None. IMPRESSION: 1. No acute intracranial pathology. 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  2. No displaced fractures or dislocations of the facial bones. 3. Soft tissue contusion and hematoma overlying the left brow, superficial orbit, left calvarium, and left skull base.  No underlying fracture. 4. No fracture or static subluxation of the cervical spine. 5. Moderate multilevel cervical disc degenerative disease. Electronically Signed   By: Jearld Lesch M.D.   On: 03/26/2023 17:11   CT CERVICAL SPINE WO CONTRAST Result Date: 03/26/2023 CLINICAL DATA:  Unwitnessed fall, unknown injury EXAM: CT HEAD WITHOUT CONTRAST CT MAXILLOFACIAL WITHOUT CONTRAST CT CERVICAL SPINE WITHOUT CONTRAST TECHNIQUE: Multidetector CT imaging of the head, cervical spine, and maxillofacial structures were performed using the standard protocol without intravenous contrast. Multiplanar CT image reconstructions of the cervical spine and maxillofacial structures were also generated. RADIATION DOSE REDUCTION: This exam was performed according to the departmental dose-optimization program which includes automated exposure control, adjustment of the mA and/or kV according to patient size and/or use of iterative reconstruction technique. COMPARISON:  11/20/2021 FINDINGS: CT HEAD FINDINGS Brain: No evidence of acute infarction, hemorrhage, hydrocephalus, extra-axial collection or mass lesion/mass effect. Periventricular white matter hypodensity. Unchanged bifrontal encephalomalacia (series 2, image 24). Vascular: No hyperdense vessel or unexpected calcification. CT FACIAL BONES FINDINGS Skull: Normal. Negative for fracture or focal lesion. Facial bones: No displaced fractures or dislocations. Sinuses/Orbits: No acute finding. Other: Soft tissue contusion and hematoma overlying the left brow, superficial orbit, left calvarium, and left skull base (series 2, image 20 13 7). Patient is edentulous. CT CERVICAL SPINE FINDINGS Alignment: Normal. Skull base and vertebrae: No acute fracture. No primary bone lesion or focal pathologic process.  Soft tissues and spinal canal: No prevertebral fluid or swelling. No visible canal hematoma. Disc levels: Moderate multilevel cervical disc degenerative disease. Upper chest: Negative. Other: None. IMPRESSION: 1. No acute intracranial pathology. 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 2. No displaced fractures or dislocations of the facial bones. 3. Soft tissue contusion and hematoma overlying the left brow, superficial orbit, left calvarium, and left skull base. No underlying fracture. 4. No fracture or static subluxation of the cervical spine. 5. Moderate multilevel cervical disc degenerative disease. Electronically Signed   By: Jearld Lesch M.D.   On: 03/26/2023 17:11   CT Maxillofacial Wo Contrast Result Date: 03/26/2023 CLINICAL DATA:  Unwitnessed fall, unknown injury EXAM: CT HEAD WITHOUT CONTRAST CT MAXILLOFACIAL WITHOUT CONTRAST CT CERVICAL SPINE WITHOUT CONTRAST TECHNIQUE: Multidetector CT imaging of the head, cervical spine, and maxillofacial structures were performed using the standard protocol without intravenous contrast. Multiplanar CT image reconstructions of the cervical spine and maxillofacial structures were also generated. RADIATION DOSE REDUCTION: This exam was performed according to the departmental dose-optimization program which includes automated exposure control, adjustment of the mA and/or kV according to patient size and/or use of iterative reconstruction technique. COMPARISON:  11/20/2021 FINDINGS: CT HEAD FINDINGS Brain: No evidence of acute infarction, hemorrhage, hydrocephalus, extra-axial collection or mass lesion/mass effect. Periventricular white matter hypodensity. Unchanged bifrontal encephalomalacia (series 2, image 24). Vascular: No hyperdense vessel or unexpected calcification. CT FACIAL BONES FINDINGS Skull: Normal. Negative for fracture or focal lesion. Facial bones: No displaced fractures or dislocations. Sinuses/Orbits: No acute  finding. Other: Soft tissue contusion and hematoma overlying the left brow, superficial orbit, left calvarium, and left skull base (series 2, image 20 13 7). Patient is edentulous. CT CERVICAL SPINE FINDINGS Alignment: Normal. Skull base and vertebrae: No acute fracture. No primary bone lesion or focal pathologic process. Soft tissues and spinal canal: No prevertebral fluid or swelling. No visible canal hematoma. Disc levels: Moderate multilevel cervical disc degenerative disease. Upper chest: Negative. Other: None. IMPRESSION: 1. No acute intracranial pathology. 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 2.  No displaced fractures or dislocations of the facial bones. 3. Soft tissue contusion and hematoma overlying the left brow, superficial orbit, left calvarium, and left skull base. No underlying fracture. 4. No fracture or static subluxation of the cervical spine. 5. Moderate multilevel cervical disc degenerative disease. Electronically Signed   By: Jearld Lesch M.D.   On: 03/26/2023 17:11   [place] Venous Img Upper Uni Left Result Date: 03/26/2023 CLINICAL DATA:  swelling, eval for dvt EXAM: LEFT UPPER EXTREMITY VENOUS DOPPLER ULTRASOUND TECHNIQUE: Gray-scale sonography with graded compression, as well as color Doppler and duplex ultrasound were performed to evaluate the upper extremity deep venous system from the level of the subclavian vein and including the jugular, axillary, basilic, radial, ulnar and upper cephalic vein. Spectral Doppler was utilized to evaluate flow at rest and with distal augmentation maneuvers. COMPARISON:  CTA chest 03/26/2023 FINDINGS: VENOUS Normal compressibility of the LEFT internal jugular, subclavian, axillary, cephalic, basilic, brachial, radial and ulnar veins. No filling defects to suggest DVT on grayscale or color Doppler imaging. Doppler waveforms show normal direction of venous flow, normal respiratory plasticity and response to augmentation.  Limited views of the contralateral subclavian vein are unremarkable. OTHER No evidence of superficial thrombophlebitis or abnormal fluid collection. Limitations: none IMPRESSION: No evidence of DVT or superficial thrombophlebitis within the LEFT upper extremity. Roanna Banning, MD [facility] Electronically Signed   By: Roanna Banning M.D.   On: 03/26/2023 16:00   DG Shoulder Left Result Date: 03/26/2023 CLINICAL DATA:  Left arm pain status post fall EXAM: LEFT HUMERUS - 2 VIEW; LEFT SHOULDER - 3 VIEW COMPARISON:  None Available. FINDINGS: There is no evidence of fracture or dislocation. Degenerative changes of the shoulder. Soft tissues are unremarkable. IMPRESSION: No acute fracture or dislocation. Degenerative changes of the shoulder. Electronically Signed   By: Agustin Cree M.D.   On: 03/26/2023 13:51   DG Humerus Left Result Date: 03/26/2023 CLINICAL DATA:  Left arm pain status post fall EXAM: LEFT HUMERUS - 2 VIEW; LEFT SHOULDER - 3 VIEW COMPARISON:  None Available. FINDINGS: There is no evidence of fracture or dislocation. Degenerative changes of the shoulder. Soft tissues are unremarkable. IMPRESSION: No acute fracture or dislocation. Degenerative changes of the shoulder. Electronically Signed   By: Agustin Cree M.D.   On: 03/26/2023 13:51   DG Chest Port 1 View Result Date: 03/26/2023 CLINICAL DATA:  Unwitnessed fall with right arm pain. Hypoxia and new altered mental status EXAM: PORTABLE CHEST 1 VIEW COMPARISON:  Chest radiograph dated 11/20/2021 FINDINGS: Low lung volumes with bronchovascular crowding. Diffuse bilateral interstitial and patchy opacities. Blunting of the bilateral costophrenic angles. No pneumothorax. Similar enlarged cardiomediastinal silhouette. No radiographic finding of acute displaced fracture. IMPRESSION: 1. Diffuse bilateral interstitial and patchy opacities, which may represent atelectasis, pulmonary edema, or multifocal  infection. 2. Blunting of the bilateral costophrenic angles, which may represent small pleural effusions. Electronically Signed   By: Agustin Cree M.D.   On: 03/26/2023 13:36        Scheduled Meds:  aspirin EC  81 mg Oral Daily   budesonide (PULMICORT) nebulizer solution  2 mg Nebulization Q12H   enoxaparin (LOVENOX) injection  0.5 mg/kg Subcutaneous Q24H   furosemide  20 mg Oral Daily   hydrocortisone   Topical BID   insulin aspart  0-5 Units Subcutaneous QHS   insulin aspart  0-9 Units Subcutaneous TID WC   ipratropium-albuterol  3 mL Nebulization Q6H   lisinopril  2.5  mg Oral Daily   oseltamivir  30 mg Oral BID   predniSONE  40 mg Oral Q breakfast   Continuous Infusions:  cefTRIAXone (ROCEPHIN)  IV Stopped (03/26/23 2154)   metronidazole Stopped (03/26/23 2225)     LOS: 1 day       Charise Killian, MD [facility] Pager [phone]  If 7PM-7AM, please contact night-coverage [email or web address] 03/27/2023, 8:54 AM

## 2023-03-27 NOTE — Evaluation (Signed)
 Clinical/Bedside Swallow Evaluation Patient Details  Name: Derek Lynch MRN: [id number] Date of Birth: 06-03-1940  Today's Date: 03/27/2023 Time: SLP Start Time (ACUTE ONLY): 0955 SLP Stop Time (ACUTE ONLY): 1015 SLP Time Calculation (min) (ACUTE ONLY): 20 min  Past Medical History:  Past Medical History:  Diagnosis Date   Arthritis    Hyperlipidemia    Past Surgical History:  Past Surgical History:  Procedure Laterality Date   COLONOSCOPY     HPI:  Per H&P: "Derek Lynch is a 83 y.o. male with medical history significant of HTN, HLD, IIDM, chronic ambulatory dysfunction, multiple always, presented with 2 days of cough shortness of breath wheezing malaise weakness and fall. Patient lives by himself.  And this time he seems to have sore throat, dry cough since yesterday with increasing shortness of breath.  This morning he started to feel very unsteady and lightheaded and fell on right side and hit his right arm.  Denied any head injury, but unsure about whether he lost consciousness or not.  When EMS found him he was found to be hypoxic O2 saturation 77% and very confused, and EMS gave breathing treatment and put him on NRB." CXR 03/27/23: Cardiomegaly with vascular congestion and small right pleural effusion. CT Head 03/26/23:  No acute intracranial pathology. 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    Assessment / Plan / Recommendation  Clinical Impression  Pt seen for bedside swallow assessment in the setting of concern for aspiration PNA. Pt is confused, with tangential speech, but redirectable to task/direction. Hard of hearing, requiring increased volume for speaker. Currently, pt is on 5L O2 and a regular solids and thin liquids diet. Oral motor function is intact- minimal dentition (single tooth)- pt denied use of dentures. Self feeding impacted upper extremity weakness and visual deficits, leading to assist for holding cup/spoon and visual identifying  items on tray- unsure if this is pt's baseline. Pt seen with trials of thin liquids via straw/cup, puree, and regular solids. Congested cough noted in the absence and intermittently delayed following PO trials. No change in voicing/vital signs noted. O2 saturations averaging 95 t/o. Pt with noted impulsivity with straw sips of thin liquid- leading to serial/large sips. Reduced impulsivity with use of cup sip of thin liquid. Oral phase impacted by baseline dentition and reduced attention to oral cavity/bolus- leading to extended time for mastication and clearance. Pt denied issue with dysphagia and baseline of regular solids diet.   Based on altered mentation/reduced attention to act of eating, respiratory status, deconditioning, and age, pt is at increased risk for aspiration. Recommend shift to mech soft and thin liquids. No straw to reduce impulsivity. Aspiration precautions (slow rate, small bites, elevated HOB, and alert for PO intake). Recommend intermittent supervision for PO intake to monitor for impulsivity/feeding assist. SLP will continue to monitor for dysphagia. RN and MD aware of recommendations. SLP Visit Diagnosis: Dysphagia, oropharyngeal phase (R13.12)    Aspiration Risk  Mild aspiration risk    Diet Recommendation   Thin;Dysphagia 3 (mechanical soft)  Medication Administration: Whole meds with liquid    Other  Recommendations Oral Care Recommendations: Oral care BID    Recommendations for follow up therapy are one component of a multi-disciplinary discharge planning process, led by the attending physician.  Recommendations may be updated based on patient status, additional functional criteria and insurance authorization.  Follow up Recommendations Follow physician's recommendations for discharge plan and follow up therapies      Assistance Recommended at  Discharge    Functional Status Assessment Patient has had a recent decline in their functional status and demonstrates the  ability to make significant improvements in function in a reasonable and predictable amount of time.  Frequency and Duration min 1 x/week  1 week       Prognosis Prognosis for improved oropharyngeal function: Fair Barriers to Reach Goals: Cognitive deficits (baseline dentition)      Swallow Study   General Date of Onset: 03/27/23 HPI: Per H&P: "Derek Lynch is a 83 y.o. male with medical history significant of HTN, HLD, IIDM, chronic ambulatory dysfunction, multiple always, presented with 2 days of cough shortness of breath wheezing malaise weakness and fall. Patient lives by himself.  And this time he seems to have sore throat, dry cough since yesterday with increasing shortness of breath.  This morning he started to feel very unsteady and lightheaded and fell on right side and hit his right arm.  Denied any head injury, but unsure about whether he lost consciousness or not.  When EMS found him he was found to be hypoxic O2 saturation 77% and very confused, and EMS gave breathing treatment and put him on NRB." CXR 03/27/23: Cardiomegaly with vascular congestion and small right pleural effusion. CT Head 03/26/23:  No acute intracranial pathology. 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 Type of Study: Bedside Swallow Evaluation Previous Swallow Assessment: none in chart Diet Prior to this Study: Regular;Thin liquids (Level 0) Temperature Spikes Noted: No (Temp- 98.5; WBC 6.5) Respiratory Status: Nasal cannula (5L) History of Recent Intubation: No Behavior/Cognition: Alert;Confused Oral Cavity Assessment: Within Functional Limits Oral Care Completed by SLP: No (pt participating in breakfast upon therapist approach) Oral Cavity - Dentition: Edentulous (single tooth- reported no dentures) Vision: Impaired for self-feeding Self-Feeding Abilities: Needs assist Patient Positioning: Upright in bed Baseline Vocal Quality: Normal Volitional Cough:  Congested Volitional Swallow: Unable to elicit    Oral/Motor/Sensory Function Overall Oral Motor/Sensory Function: Within functional limits   Ice Chips Ice chips: Not tested   Thin Liquid Thin Liquid: Impaired Presentation: Straw;Cup Pharyngeal  Phase Impairments: Cough - Delayed Other Comments: congested cough- as noted t/o    Nectar Thick Nectar Thick Liquid: Not tested   Honey Thick Honey Thick Liquid: Not tested   Puree Puree: Within functional limits   Solid     Solid: Impaired Presentation: Self Fed Oral Phase Impairments: Impaired mastication;Poor awareness of bolus Oral Phase Functional Implications: Oral residue;Impaired mastication Pharyngeal Phase Impairments: Cough - Delayed Other Comments: congested cough noted at baseline     Derek Durelle Zepeda Clapp, MS, CCC-SLP Speech Language Pathologist Rehab Services; [facility] - [place] [phone] (ascom)   Derek Lynch 03/27/2023,11:38 AM

## 2023-03-27 NOTE — Progress Notes (Signed)
 Pt Having increase respiratory distress. Pt assessed by Dr. Tanya Nones and respiratory therapist. New orders including pt transfer to PCU placed. Will continue to monitor pt.

## 2023-03-28 DIAGNOSIS — J9601 Acute respiratory failure with hypoxia: Secondary | ICD-10-CM | POA: Diagnosis not present

## 2023-03-28 LAB — GLUCOSE, CAPILLARY
Glucose-Capillary: 130 mg/dL — ABNORMAL HIGH (ref 70–99)
Glucose-Capillary: 154 mg/dL — ABNORMAL HIGH (ref 70–99)
Glucose-Capillary: 218 mg/dL — ABNORMAL HIGH (ref 70–99)
Glucose-Capillary: 207 mg/dL — ABNORMAL HIGH (ref 70–99)

## 2023-03-28 LAB — CBC
HCT: 31.7 % — ABNORMAL LOW (ref 39.0–52.0)
Hemoglobin: 10.9 g/dL — ABNORMAL LOW (ref 13.0–17.0)
MCH: 31.2 pg (ref 26.0–34.0)
MCHC: 34.4 g/dL (ref 30.0–36.0)
MCV: 90.8 fL (ref 80.0–100.0)
Platelets: 178 10*3/uL (ref 150–400)
RBC: 3.49 MIL/uL — ABNORMAL LOW (ref 4.22–5.81)
RDW: 13.4 % (ref 11.5–15.5)
WBC: 7.1 10*3/uL (ref 4.0–10.5)
nRBC: 0 % (ref 0.0–0.2)

## 2023-03-28 LAB — COMPREHENSIVE METABOLIC PANEL
ALT: 51 U/L — ABNORMAL HIGH (ref 0–44)
AST: 124 U/L — ABNORMAL HIGH (ref 15–41)
Albumin: 2.7 g/dL — ABNORMAL LOW (ref 3.5–5.0)
Alkaline Phosphatase: 25 U/L — ABNORMAL LOW (ref 38–126)
Anion gap: 11 (ref 5–15)
BUN: 23 mg/dL (ref 8–23)
CO2: 25 mmol/L (ref 22–32)
Calcium: 8.1 mg/dL — ABNORMAL LOW (ref 8.9–10.3)
Chloride: 99 mmol/L (ref 98–111)
Creatinine, Ser: 1.2 mg/dL (ref 0.61–1.24)
GFR, Estimated: >60 mL/min (ref >60)
Glucose, Bld: 150 mg/dL — ABNORMAL HIGH (ref 70–99)
Potassium: 3.5 mmol/L (ref 3.5–5.1)
Sodium: 135 mmol/L (ref 135–145)
Total Bilirubin: 0.6 mg/dL (ref 0.0–1.2)
Total Protein: 6.6 g/dL (ref 6.5–8.1)

## 2023-03-28 LAB — CK: Total CK: 4272 U/L — ABNORMAL HIGH (ref 49–397)

## 2023-03-28 MED ORDER — METHYLPREDNISOLONE SODIUM SUCC 40 MG IJ SOLR
40.0000 mg | Freq: Every day | INTRAMUSCULAR | Status: DC
  Administered 2023-03-29 – 2023-03-31 (×3): 40 mg via INTRAVENOUS
  Filled 2023-03-28 (×3): qty 1

## 2023-03-28 MED ORDER — FUROSEMIDE 10 MG/ML IJ SOLN
40.0000 mg | Freq: Every day | INTRAMUSCULAR | Status: DC
  Administered 2023-03-28 – 2023-04-01 (×5): 40 mg via INTRAVENOUS
  Filled 2023-03-28 (×5): qty 4

## 2023-03-28 NOTE — Progress Notes (Signed)
 PROGRESS NOTE    Derek Lynch  UJW:[id number] DOB: [birth year] DOA: 03/26/2023 PCP: Cheryll Dessert, FNP   Assessment & Plan:   Principal Problem:   Hypoxia Active Problems:   Fall   Influenza A with pneumonia   Acute metabolic encephalopathy   CHF (congestive heart failure) (HCC)  Assessment and Plan: Sepsis: with acute endorgan damage. Met criteria w/ hypoxia, tachypnea, elevated lactic acid & likely secondary to pneumonia. Continue on IV abxs, bronchodilators & encourage incentive spirometry. Weaned off of BiPAP. Continue on HFNC and wean as tolerated  Influenza pneumonia: continue on tamiflu, steroids. Droplet precautions   Aspiration pneumonia: continue on IV rocephin, flagyl, bronchodilators & encourage incentive spirometry   Acute hypoxic respiratory failure: likely secondary to pneumonia. CTA chest was neg for PE. Weaned off of BiPAP. Continue on HFNC and wean as tolerated   Acute encephalopathy: likely secondary to above infections. Re-orient prn    Questionable syncope: echo shows EF 55-60%, diastolic function is indeterminate & no regional wall motion abnormalities. PT/OT consulted    Rhabdomyolysis: CK is labile. Holding statin    DM2: likely poorly controlled, HbA1c 8.5. Continue on SSI w/ accuchecks    AKI: secondary to sepsis & rhabdomyolysis. 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 Would benefit from weight loss     DVT prophylaxis: lovenox  Code Status: DNR Family Communication: Disposition Plan: likely d/c to SNF  Level of care: Progressive  Status is: Inpatient Remains inpatient appropriate because: severity of illness, requiring IV abxs     Consultants:    Procedures:   Antimicrobials: rocephin, flagyl   Subjective: Pt is pleasantly confused   Objective: Vitals:   03/27/23 1940 03/28/23 0023 03/28/23 0105 03/28/23 0400  BP: 119/71 117/73  130/82   Pulse: 72 69  73  Resp: 20 20  20   Temp: 97.9 F (36.6 C) 98.5 F (36.9 C)  98.4 F (36.9 C)  TempSrc: Oral Axillary  Oral  SpO2: 96% (!) 89% 96% 99%  Weight:      Height:        Intake/Output Summary (Last 24 hours) at 03/28/2023 0915 Last data filed at 03/28/2023 0021 Gross per 24 hour  Intake 240 ml  Output 550 ml  Net -310 ml   Filed Weights   03/26/23 1054  Weight: 88.4 kg    Examination:  General exam: Appears confused   Respiratory system: course breath sounds b/l   Cardiovascular system: S1/S2+. No rubs or gallops  Gastrointestinal system: Abd is soft, NT, obese & hypoactive bowel sounds  Central nervous system: alert & awake. Moves all extremities  Psychiatry: Judgement and insight appears poor. Flat mood and affect    Data Reviewed: I have personally reviewed following labs and imaging studies  CBC: Recent Labs  Lab 03/26/23 1048 03/27/23 0428 03/28/23 0425  WBC 8.5 6.5 7.1  NEUTROABS 6.9  --   --   HGB 12.6* 11.5* 10.9*  HCT 37.8* 33.7* 31.7*  MCV 94.3 91.6 90.8  PLT 199 176 178   Basic Metabolic Panel: Recent Labs  Lab 03/26/23 1048 03/27/23 0428 03/28/23 0425  NA 137 132* 135  K 4.3 4.2 3.5  CL 98 98 99  CO2 24 22 25   GLUCOSE 242* 268* 150*  BUN 18 19 23   CREATININE 1.43* 1.36* 1.20  CALCIUM 8.7* 8.0* 8.1*   GFR: Estimated Creatinine Clearance: 49.2 mL/min (by C-G formula based on  SCr of 1.2 mg/dL). Liver Function Tests: Recent Labs  Lab 03/26/23 1048 03/28/23 0425  AST 99* 124*  ALT 41 51*  ALKPHOS 33* 25*  BILITOT 0.7 0.6  PROT 7.9 6.6  ALBUMIN 3.5 2.7*   Recent Labs  Lab 03/26/23 1048  LIPASE 24   No results for input(s): "AMMONIA" in the last 168 hours. Coagulation Profile: Recent Labs  Lab 03/26/23 1048  INR 1.1   Cardiac Enzymes: Recent Labs  Lab 03/26/23 1329 03/27/23 0428 03/28/23 0425  CKTOTAL 4,531* 6,211* 4,272*   BNP (last 3 results) No results for input(s): "PROBNP" in the last 8760  hours. HbA1C: Recent Labs    03/26/23 1048  HGBA1C 8.5*   CBG: Recent Labs  Lab 03/27/23 0750 03/27/23 1245 03/27/23 1545 03/27/23 2104 03/28/23 0818  GLUCAP 262* 207* 201* 229* 130*   Lipid Profile: No results for input(s): "CHOL", "HDL", "LDLCALC", "TRIG", "CHOLHDL", "LDLDIRECT" in the last 72 hours. Thyroid Function Tests: No results for input(s): "TSH", "T4TOTAL", "FREET4", "T3FREE", "THYROIDAB" in the last 72 hours. Anemia Panel: No results for input(s): "VITAMINB12", "FOLATE", "FERRITIN", "TIBC", "IRON", "RETICCTPCT" in the last 72 hours. Sepsis Labs: Recent Labs  Lab 03/26/23 1048 03/26/23 1329  LATICACIDVEN 3.6* 2.2*    Recent Results (from the past 240 hours)  Blood Culture (routine x 2)     Status: None (Preliminary result)   Collection Time: 03/26/23 10:48 AM   Specimen: Right Antecubital; Blood  Result Value Ref Range Status   Specimen Description RIGHT ANTECUBITAL  Final   Special Requests   Final    Blood Culture results may not be optimal due to an inadequate volume of blood received in culture bottles   Culture   Final    NO GROWTH 2 DAYS Performed at [facility], [address] [place], [place] [address]    Report Status PENDING  Incomplete  Blood Culture (routine x 2)     Status: None (Preliminary result)   Collection Time: 03/26/23 10:48 AM   Specimen: BLOOD  Result Value Ref Range Status   Specimen Description BLOOD BLOOD RIGHT HAND  Final   Special Requests   Final    Blood Culture results may not be optimal due to an inadequate volume of blood received in culture bottles   Culture   Final    NO GROWTH 2 DAYS Performed at [facility], [address] [place], [place] [address]    Report Status PENDING  Incomplete  Resp panel by RT-PCR (RSV, Flu A&B, Covid) Anterior Nasal Swab     Status: Abnormal   Collection Time: 03/26/23 10:48 AM   Specimen: Anterior Nasal Swab  Result Value Ref Range Status   SARS Coronavirus  2 by RT PCR NEGATIVE NEGATIVE Final    Comment: (NOTE) SARS-CoV-2 target nucleic acids are NOT DETECTED.  The SARS-CoV-2 RNA is generally detectable in upper respiratory specimens during the acute phase of infection. The lowest concentration of SARS-CoV-2 viral copies this assay can detect is 138 copies/mL. A negative result does not preclude SARS-Cov-2 infection and should not be used as the sole basis for treatment or other patient management decisions. A negative result may occur with  improper specimen collection/handling, submission of specimen other than nasopharyngeal swab, presence of viral mutation(s) within the areas targeted by this assay, and inadequate number of viral copies(<138 copies/mL). A negative result must be combined with clinical observations, patient history, and epidemiological information. The expected result is Negative.  Fact Sheet for Patients:  BloggerCourse.com  Fact Sheet for Healthcare Providers:  SeriousBroker.it  This test is no t yet approved or cleared by the [place] FDA and  has been authorized for detection and/or diagnosis of SARS-CoV-2 by FDA under an Emergency Use Authorization (EUA). This EUA will remain  in effect (meaning this test can be used) for the duration of the COVID-19 declaration under Section 564(b)(1) of the Act, 21 U.S.C.section 360bbb-3(b)(1), unless the authorization is terminated  or revoked sooner.       Influenza A by PCR POSITIVE (A) NEGATIVE Final   Influenza B by PCR NEGATIVE NEGATIVE Final    Comment: (NOTE) The Xpert Xpress SARS-CoV-2/FLU/RSV plus assay is intended as an aid in the diagnosis of influenza from Nasopharyngeal swab specimens and should not be used as a sole basis for treatment. Nasal washings and aspirates are unacceptable for Xpert Xpress SARS-CoV-2/FLU/RSV testing.  Fact Sheet for  Patients: BloggerCourse.com  Fact Sheet for Healthcare Providers: SeriousBroker.it  This test is not yet approved or cleared by the [place] FDA and has been authorized for detection and/or diagnosis of SARS-CoV-2 by FDA under an Emergency Use Authorization (EUA). This EUA will remain in effect (meaning this test can be used) for the duration of the COVID-19 declaration under Section 564(b)(1) of the Act, 21 U.S.C. section 360bbb-3(b)(1), unless the authorization is terminated or revoked.     Resp Syncytial Virus by PCR NEGATIVE NEGATIVE Final    Comment: (NOTE) Fact Sheet for Patients: BloggerCourse.com  Fact Sheet for Healthcare Providers: SeriousBroker.it  This test is not yet approved or cleared by the [place] FDA and has been authorized for detection and/or diagnosis of SARS-CoV-2 by FDA under an Emergency Use Authorization (EUA). This EUA will remain in effect (meaning this test can be used) for the duration of the COVID-19 declaration under Section 564(b)(1) of the Act, 21 U.S.C. section 360bbb-3(b)(1), unless the authorization is terminated or revoked.  Performed at [facility], [address] [place], [place] [address]   MRSA Next Gen by PCR, Nasal     Status: None   Collection Time: 03/26/23  3:49 PM   Specimen: Nasal Mucosa; Nasal Swab  Result Value Ref Range Status   MRSA by PCR Next Gen NOT DETECTED NOT DETECTED Final    Comment: (NOTE) The GeneXpert MRSA Assay (FDA approved for NASAL specimens only), is one component of a comprehensive MRSA colonization surveillance program. It is not intended to diagnose MRSA infection nor to guide or monitor treatment for MRSA infections. Test performance is not FDA approved in patients less than 42 years old. Performed at [facility], [address] [place], [place] [address]           Radiology Studies: ECHOCARDIOGRAM COMPLETE Result Date: 03/27/2023    ECHOCARDIOGRAM REPORT   Patient Name:   CAINEN BURNHAM Ketchum Date of Exam: 03/27/2023 Medical Rec #:  [id number]     Height:       64.0 in Accession #:    [id number]    Weight:       194.9 lb Date of Birth:  October 05, 1940    BSA:          1.935 m Patient Age:    82 years      BP:           152/92 mmHg Patient Gender: M             HR:           90  bpm. Exam Location:  [facility] Procedure: 2D Echo, Cardiac Doppler and Color Doppler (Both Spectral and Color            Flow Doppler were utilized during procedure). Indications:     CHF I50.31  History:         Patient has no prior history of Echocardiogram examinations.  Sonographer:     Overton Mam RDCS, FASE Referring Phys:  [id number] Emeline General Diagnosing Phys: Julien Nordmann MD  Sonographer Comments: Technically difficult study due to poor echo windows and no subcostal window. Image acquisition challenging due to patient body habitus and Image acquisition challenging due to respiratory motion. IMPRESSIONS  1. Left ventricular ejection fraction, by estimation, is 55 to 60%. Left ventricular ejection fraction by PLAX is 56 %. The left ventricle has normal function. The left ventricle has no regional wall motion abnormalities. There is mild left ventricular hypertrophy. Left ventricular diastolic parameters are indeterminate.  2. Right ventricular systolic function is normal. The right ventricular size is normal.  3. The mitral valve is normal in structure. No evidence of mitral valve regurgitation. No evidence of mitral stenosis.  4. The aortic valve is normal in structure. There is mild calcification of the aortic valve. Aortic valve regurgitation is not visualized. Aortic valve sclerosis is present, with no evidence of aortic valve stenosis.  5. The inferior vena cava is normal in size with greater than 50% respiratory variability, suggesting right atrial pressure of 3 mmHg. FINDINGS  Left  Ventricle: Left ventricular ejection fraction, by estimation, is 55 to 60%. Left ventricular ejection fraction by PLAX is 56 %. The left ventricle has normal function. The left ventricle has no regional wall motion abnormalities. Global longitudinal strain performed but not reported based on interpreter judgement due to suboptimal tracking. The left ventricular internal cavity size was normal in size. There is mild left ventricular hypertrophy. Left ventricular diastolic parameters are indeterminate. Right Ventricle: The right ventricular size is normal. No increase in right ventricular wall thickness. Right ventricular systolic function is normal. Left Atrium: Left atrial size was normal in size. Right Atrium: Right atrial size was normal in size. Pericardium: There is no evidence of pericardial effusion. Mitral Valve: The mitral valve is normal in structure. No evidence of mitral valve regurgitation. No evidence of mitral valve stenosis. Tricuspid Valve: The tricuspid valve is normal in structure. Tricuspid valve regurgitation is not demonstrated. No evidence of tricuspid stenosis. Aortic Valve: The aortic valve is normal in structure. There is mild calcification of the aortic valve. Aortic valve regurgitation is not visualized. Aortic valve sclerosis is present, with no evidence of aortic valve stenosis. Aortic valve peak gradient  measures 13.8 mmHg. Pulmonic Valve: The pulmonic valve was normal in structure. Pulmonic valve regurgitation is not visualized. No evidence of pulmonic stenosis. Aorta: The aortic root is normal in size and structure. Venous: The inferior vena cava is normal in size with greater than 50% respiratory variability, suggesting right atrial pressure of 3 mmHg. IAS/Shunts: No atrial level shunt detected by color flow Doppler. Additional Comments: 3D was performed not requiring image post processing on an independent workstation and was indeterminate.  LEFT VENTRICLE PLAX 2D LV EF:          Left            Diastology                ventricular     LV e' medial:    9.46 cm/s  ejection        LV E/e' medial:  11.7                fraction by     LV e' lateral:   12.60 cm/s                PLAX is 56      LV E/e' lateral: 8.8                %. LVIDd:         3.20 cm LVIDs:         2.30 cm LV PW:         1.30 cm LV IVS:        1.30 cm LVOT diam:     2.00 cm LV SV:         73 LV SV Index:   38 LVOT Area:     3.14 cm  RIGHT VENTRICLE RV Basal diam:  3.50 cm RV S prime:     15.60 cm/s TAPSE (M-mode): 1.9 cm LEFT ATRIUM             Index        RIGHT ATRIUM           Index LA diam:        2.90 cm 1.50 cm/m   RA Area:     18.70 cm LA Vol (A2C):   33.2 ml 17.16 ml/m  RA Volume:   50.90 ml  26.31 ml/m LA Vol (A4C):   25.5 ml 13.18 ml/m LA Biplane Vol: 32.4 ml 16.74 ml/m  AORTIC VALVE                 PULMONIC VALVE AV Area (Vmax): 1.89 cm     PV Vmax:       1.28 m/s AV Vmax:        186.00 cm/s  PV Peak grad:  6.6 mmHg AV Peak Grad:   13.8 mmHg LVOT Vmax:      112.00 cm/s LVOT Vmean:     74.200 cm/s LVOT VTI:       0.231 m  AORTA Ao Root diam: 3.60 cm Ao Asc diam:  3.50 cm MITRAL VALVE MV Area (PHT): 3.77 cm     SHUNTS MV Decel Time: 201 msec     Systemic VTI:  0.23 m MV E velocity: 111.00 cm/s  Systemic Diam: 2.00 cm MV A velocity: 93.80 cm/s MV E/A ratio:  1.18 Julien Nordmann MD Electronically signed by Julien Nordmann MD Signature Date/Time: 03/27/2023/11:52:05 AM    Final    DG Chest 1 View Result Date: 03/27/2023 CLINICAL DATA:  CHF. EXAM: CHEST  1 VIEW COMPARISON:  03/26/2023 FINDINGS: The cardio pericardial silhouette is enlarged. Lung apices obscured by the patient's lower face. There is pulmonary vascular congestion without overt pulmonary edema. Basilar atelectasis with small right pleural effusion. Bones are demineralized. Telemetry leads overlie the chest. IMPRESSION: Cardiomegaly with vascular congestion and small right pleural effusion. Electronically Signed   By: Kennith Center  M.D.   On: 03/27/2023 06:57   DG Chest 1 View Result Date: 03/26/2023 CLINICAL DATA:  Congestive heart failure.  Fall. EXAM: CHEST  1 VIEW COMPARISON:  Radiograph earlier today, chest CT earlier today FINDINGS: The heart is enlarged but stable. Aortic atherosclerosis. Pleural effusions are better demonstrated on CT. No pneumothorax or acute airspace disease. No displaced rib fractures. IMPRESSION: Cardiomegaly. Pleural effusions are better demonstrated on CT. Electronically Signed  By: Narda Rutherford M.D.   On: 03/26/2023 23:37   CT Angio Chest Pulmonary Embolism (PE) W or WO Contrast Result Date: 03/26/2023 CLINICAL DATA:  Sepsis, unwitnessed fall altered mental status EXAM: CT ANGIOGRAPHY CHEST CT ABDOMEN AND PELVIS WITH CONTRAST TECHNIQUE: Multidetector CT imaging of the chest was performed using the standard protocol during bolus administration of intravenous contrast. Multiplanar CT image reconstructions and MIPs were obtained to evaluate the vascular anatomy. Multidetector CT imaging of the abdomen and pelvis was performed using the standard protocol during bolus administration of intravenous contrast. RADIATION DOSE REDUCTION: This exam was performed according to the departmental dose-optimization program which includes automated exposure control, adjustment of the mA and/or kV according to patient size and/or use of iterative reconstruction technique. CONTRAST:  80mL OMNIPAQUE IOHEXOL 350 MG/ML SOLN COMPARISON:  None Available. FINDINGS: CT CHEST ANGIOGRAM FINDINGS Cardiovascular: Satisfactory opacification of the pulmonary arteries to the segmental level. No evidence of pulmonary embolism. Cardiomegaly. Three-vessel coronary artery calcifications enlargement of the main pulmonary artery measuring up to 3.5 cm in caliber. No pericardial effusion. Aortic atherosclerosis. Mediastinum/Nodes: No enlarged mediastinal, hilar, or axillary lymph nodes. Thyroid gland, trachea, and esophagus demonstrate no  significant findings. Lungs/Pleura: Small bilateral pleural effusions. Diffuse bilateral bronchial wall thickening. Scattered heterogeneous and ground-glass airspace opacities throughout the left upper lobe (series 5, image 38). Dependent bibasilar scarring and atelectasis, particularly of the posterior lingula and lateral segment right middle lobe (series 5, image 86). Musculoskeletal: No chest wall abnormality. No acute osseous findings. Review of the MIP images confirms the above findings. CT ABDOMEN PELVIS FINDINGS Hepatobiliary: No solid liver abnormality is seen. Hepatic steatosis. No gallstones, gallbladder wall thickening, or biliary dilatation. Pancreas: Unremarkable. No pancreatic ductal dilatation or surrounding inflammatory changes. Spleen: Normal in size without significant abnormality. Adrenals/Urinary Tract: Adrenal glands are unremarkable. Nonobstructive inferior pole right renal calculus. Multiple simple, benign bilateral renal cortical cysts, for which no further follow-up or characterization is required. Bladder is unremarkable. Stomach/Bowel: Stomach is within normal limits. Appendix appears normal. No evidence of bowel wall thickening, distention, or inflammatory changes. Sigmoid diverticulosis. Vascular/Lymphatic: Aortic atherosclerosis. No enlarged abdominal or pelvic lymph nodes. Reproductive: No mass or other significant abnormality. Other: Large right, small left fat containing inguinal hernias. No ascites. Musculoskeletal: No acute or significant osseous findings. IMPRESSION: 1. Negative examination for pulmonary embolism. 2. Diffuse bilateral bronchial wall thickening and scattered heterogeneous and ground-glass airspace opacities throughout the left upper lobe, consistent with infection or aspiration. 3. Small bilateral pleural effusions. Dependent bibasilar scarring and atelectasis. 4. Cardiomegaly and coronary artery disease. 5. Enlargement of the main pulmonary artery, as can be seen  in pulmonary hypertension. 6. No acute CT findings of the abdomen or pelvis. 7. Hepatic steatosis. 8. Nonobstructive right nephrolithiasis. Aortic Atherosclerosis (ICD10-I70.0). Electronically Signed   By: Jearld Lesch M.D.   On: 03/26/2023 17:20   CT ABDOMEN PELVIS W CONTRAST Result Date: 03/26/2023 CLINICAL DATA:  Sepsis, unwitnessed fall altered mental status EXAM: CT ANGIOGRAPHY CHEST CT ABDOMEN AND PELVIS WITH CONTRAST TECHNIQUE: Multidetector CT imaging of the chest was performed using the standard protocol during bolus administration of intravenous contrast. Multiplanar CT image reconstructions and MIPs were obtained to evaluate the vascular anatomy. Multidetector CT imaging of the abdomen and pelvis was performed using the standard protocol during bolus administration of intravenous contrast. RADIATION DOSE REDUCTION: This exam was performed according to the departmental dose-optimization program which includes automated exposure control, adjustment of the mA and/or kV according to patient size and/or use of  iterative reconstruction technique. CONTRAST:  80mL OMNIPAQUE IOHEXOL 350 MG/ML SOLN COMPARISON:  None Available. FINDINGS: CT CHEST ANGIOGRAM FINDINGS Cardiovascular: Satisfactory opacification of the pulmonary arteries to the segmental level. No evidence of pulmonary embolism. Cardiomegaly. Three-vessel coronary artery calcifications enlargement of the main pulmonary artery measuring up to 3.5 cm in caliber. No pericardial effusion. Aortic atherosclerosis. Mediastinum/Nodes: No enlarged mediastinal, hilar, or axillary lymph nodes. Thyroid gland, trachea, and esophagus demonstrate no significant findings. Lungs/Pleura: Small bilateral pleural effusions. Diffuse bilateral bronchial wall thickening. Scattered heterogeneous and ground-glass airspace opacities throughout the left upper lobe (series 5, image 38). Dependent bibasilar scarring and atelectasis, particularly of the posterior lingula and  lateral segment right middle lobe (series 5, image 86). Musculoskeletal: No chest wall abnormality. No acute osseous findings. Review of the MIP images confirms the above findings. CT ABDOMEN PELVIS FINDINGS Hepatobiliary: No solid liver abnormality is seen. Hepatic steatosis. No gallstones, gallbladder wall thickening, or biliary dilatation. Pancreas: Unremarkable. No pancreatic ductal dilatation or surrounding inflammatory changes. Spleen: Normal in size without significant abnormality. Adrenals/Urinary Tract: Adrenal glands are unremarkable. Nonobstructive inferior pole right renal calculus. Multiple simple, benign bilateral renal cortical cysts, for which no further follow-up or characterization is required. Bladder is unremarkable. Stomach/Bowel: Stomach is within normal limits. Appendix appears normal. No evidence of bowel wall thickening, distention, or inflammatory changes. Sigmoid diverticulosis. Vascular/Lymphatic: Aortic atherosclerosis. No enlarged abdominal or pelvic lymph nodes. Reproductive: No mass or other significant abnormality. Other: Large right, small left fat containing inguinal hernias. No ascites. Musculoskeletal: No acute or significant osseous findings. IMPRESSION: 1. Negative examination for pulmonary embolism. 2. Diffuse bilateral bronchial wall thickening and scattered heterogeneous and ground-glass airspace opacities throughout the left upper lobe, consistent with infection or aspiration. 3. Small bilateral pleural effusions. Dependent bibasilar scarring and atelectasis. 4. Cardiomegaly and coronary artery disease. 5. Enlargement of the main pulmonary artery, as can be seen in pulmonary hypertension. 6. No acute CT findings of the abdomen or pelvis. 7. Hepatic steatosis. 8. Nonobstructive right nephrolithiasis. Aortic Atherosclerosis (ICD10-I70.0). Electronically Signed   By: Jearld Lesch M.D.   On: 03/26/2023 17:20   CT Head Wo Contrast Result Date: 03/26/2023 CLINICAL DATA:   Unwitnessed fall, unknown injury EXAM: CT HEAD WITHOUT CONTRAST CT MAXILLOFACIAL WITHOUT CONTRAST CT CERVICAL SPINE WITHOUT CONTRAST TECHNIQUE: Multidetector CT imaging of the head, cervical spine, and maxillofacial structures were performed using the standard protocol without intravenous contrast. Multiplanar CT image reconstructions of the cervical spine and maxillofacial structures were also generated. RADIATION DOSE REDUCTION: This exam was performed according to the departmental dose-optimization program which includes automated exposure control, adjustment of the mA and/or kV according to patient size and/or use of iterative reconstruction technique. COMPARISON:  11/20/2021 FINDINGS: CT HEAD FINDINGS Brain: No evidence of acute infarction, hemorrhage, hydrocephalus, extra-axial collection or mass lesion/mass effect. Periventricular white matter hypodensity. Unchanged bifrontal encephalomalacia (series 2, image 24). Vascular: No hyperdense vessel or unexpected calcification. CT FACIAL BONES FINDINGS Skull: Normal. Negative for fracture or focal lesion. Facial bones: No displaced fractures or dislocations. Sinuses/Orbits: No acute finding. Other: Soft tissue contusion and hematoma overlying the left brow, superficial orbit, left calvarium, and left skull base (series 2, image 20 13 7). Patient is edentulous. CT CERVICAL SPINE FINDINGS Alignment: Normal. Skull base and vertebrae: No acute fracture. No primary bone lesion or focal pathologic process. Soft tissues and spinal canal: No prevertebral fluid or swelling. No visible canal hematoma. Disc levels: Moderate multilevel cervical disc degenerative disease. Upper chest: Negative. Other: None. IMPRESSION: 1. No  acute intracranial pathology. 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 2. No displaced fractures or dislocations of the facial bones. 3. Soft tissue contusion and hematoma overlying the left brow, superficial orbit, left  calvarium, and left skull base. No underlying fracture. 4. No fracture or static subluxation of the cervical spine. 5. Moderate multilevel cervical disc degenerative disease. Electronically Signed   By: Jearld Lesch M.D.   On: 03/26/2023 17:11   CT CERVICAL SPINE WO CONTRAST Result Date: 03/26/2023 CLINICAL DATA:  Unwitnessed fall, unknown injury EXAM: CT HEAD WITHOUT CONTRAST CT MAXILLOFACIAL WITHOUT CONTRAST CT CERVICAL SPINE WITHOUT CONTRAST TECHNIQUE: Multidetector CT imaging of the head, cervical spine, and maxillofacial structures were performed using the standard protocol without intravenous contrast. Multiplanar CT image reconstructions of the cervical spine and maxillofacial structures were also generated. RADIATION DOSE REDUCTION: This exam was performed according to the departmental dose-optimization program which includes automated exposure control, adjustment of the mA and/or kV according to patient size and/or use of iterative reconstruction technique. COMPARISON:  11/20/2021 FINDINGS: CT HEAD FINDINGS Brain: No evidence of acute infarction, hemorrhage, hydrocephalus, extra-axial collection or mass lesion/mass effect. Periventricular white matter hypodensity. Unchanged bifrontal encephalomalacia (series 2, image 24). Vascular: No hyperdense vessel or unexpected calcification. CT FACIAL BONES FINDINGS Skull: Normal. Negative for fracture or focal lesion. Facial bones: No displaced fractures or dislocations. Sinuses/Orbits: No acute finding. Other: Soft tissue contusion and hematoma overlying the left brow, superficial orbit, left calvarium, and left skull base (series 2, image 20 13 7). Patient is edentulous. CT CERVICAL SPINE FINDINGS Alignment: Normal. Skull base and vertebrae: No acute fracture. No primary bone lesion or focal pathologic process. Soft tissues and spinal canal: No prevertebral fluid or swelling. No visible canal hematoma. Disc levels: Moderate multilevel cervical disc degenerative  disease. Upper chest: Negative. Other: None. IMPRESSION: 1. No acute intracranial pathology. 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 2. No displaced fractures or dislocations of the facial bones. 3. Soft tissue contusion and hematoma overlying the left brow, superficial orbit, left calvarium, and left skull base. No underlying fracture. 4. No fracture or static subluxation of the cervical spine. 5. Moderate multilevel cervical disc degenerative disease. Electronically Signed   By: Jearld Lesch M.D.   On: 03/26/2023 17:11   CT Maxillofacial Wo Contrast Result Date: 03/26/2023 CLINICAL DATA:  Unwitnessed fall, unknown injury EXAM: CT HEAD WITHOUT CONTRAST CT MAXILLOFACIAL WITHOUT CONTRAST CT CERVICAL SPINE WITHOUT CONTRAST TECHNIQUE: Multidetector CT imaging of the head, cervical spine, and maxillofacial structures were performed using the standard protocol without intravenous contrast. Multiplanar CT image reconstructions of the cervical spine and maxillofacial structures were also generated. RADIATION DOSE REDUCTION: This exam was performed according to the departmental dose-optimization program which includes automated exposure control, adjustment of the mA and/or kV according to patient size and/or use of iterative reconstruction technique. COMPARISON:  11/20/2021 FINDINGS: CT HEAD FINDINGS Brain: No evidence of acute infarction, hemorrhage, hydrocephalus, extra-axial collection or mass lesion/mass effect. Periventricular white matter hypodensity. Unchanged bifrontal encephalomalacia (series 2, image 24). Vascular: No hyperdense vessel or unexpected calcification. CT FACIAL BONES FINDINGS Skull: Normal. Negative for fracture or focal lesion. Facial bones: No displaced fractures or dislocations. Sinuses/Orbits: No acute finding. Other: Soft tissue contusion and hematoma overlying the left brow, superficial orbit, left calvarium, and left skull base (series 2, image 20 13 7).  Patient is edentulous. CT CERVICAL SPINE FINDINGS Alignment: Normal. Skull base and vertebrae: No acute fracture. No primary bone lesion or focal pathologic process. Soft tissues  and spinal canal: No prevertebral fluid or swelling. No visible canal hematoma. Disc levels: Moderate multilevel cervical disc degenerative disease. Upper chest: Negative. Other: None. IMPRESSION: 1. No acute intracranial pathology. 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 2. No displaced fractures or dislocations of the facial bones. 3. Soft tissue contusion and hematoma overlying the left brow, superficial orbit, left calvarium, and left skull base. No underlying fracture. 4. No fracture or static subluxation of the cervical spine. 5. Moderate multilevel cervical disc degenerative disease. Electronically Signed   By: Jearld Lesch M.D.   On: 03/26/2023 17:11   US Venous Img Upper Uni Left Result Date: 03/26/2023 CLINICAL DATA:  swelling, eval for dvt EXAM: LEFT UPPER EXTREMITY VENOUS DOPPLER ULTRASOUND TECHNIQUE: Gray-scale sonography with graded compression, as well as color Doppler and duplex ultrasound were performed to evaluate the upper extremity deep venous system from the level of the subclavian vein and including the jugular, axillary, basilic, radial, ulnar and upper cephalic vein. Spectral Doppler was utilized to evaluate flow at rest and with distal augmentation maneuvers. COMPARISON:  CTA chest 03/26/2023 FINDINGS: VENOUS Normal compressibility of the LEFT internal jugular, subclavian, axillary, cephalic, basilic, brachial, radial and ulnar veins. No filling defects to suggest DVT on grayscale or color Doppler imaging. Doppler waveforms show normal direction of venous flow, normal respiratory plasticity and response to augmentation. Limited views of the contralateral subclavian vein are unremarkable. OTHER No evidence of superficial thrombophlebitis or abnormal fluid collection. Limitations:  none IMPRESSION: No evidence of DVT or superficial thrombophlebitis within the LEFT upper extremity. Roanna Banning, MD [facility] Radiology Electronically Signed   By: Roanna Banning M.D.   On: 03/26/2023 16:00   DG Shoulder Left Result Date: 03/26/2023 CLINICAL DATA:  Left arm pain status post fall EXAM: LEFT HUMERUS - 2 VIEW; LEFT SHOULDER - 3 VIEW COMPARISON:  None Available. FINDINGS: There is no evidence of fracture or dislocation. Degenerative changes of the shoulder. Soft tissues are unremarkable. IMPRESSION: No acute fracture or dislocation. Degenerative changes of the shoulder. Electronically Signed   By: Agustin Cree M.D.   On: 03/26/2023 13:51   DG Humerus Left Result Date: 03/26/2023 CLINICAL DATA:  Left arm pain status post fall EXAM: LEFT HUMERUS - 2 VIEW; LEFT SHOULDER - 3 VIEW COMPARISON:  None Available. FINDINGS: There is no evidence of fracture or dislocation. Degenerative changes of the shoulder. Soft tissues are unremarkable. IMPRESSION: No acute fracture or dislocation. Degenerative changes of the shoulder. Electronically Signed   By: Agustin Cree M.D.   On: 03/26/2023 13:51   DG Chest Port 1 View Result Date: 03/26/2023 CLINICAL DATA:  Unwitnessed fall with right arm pain. Hypoxia and new altered mental status EXAM: PORTABLE CHEST 1 VIEW COMPARISON:  Chest radiograph dated 11/20/2021 FINDINGS: Low lung volumes with bronchovascular crowding. Diffuse bilateral interstitial and patchy opacities. Blunting of the bilateral costophrenic angles. No pneumothorax. Similar enlarged cardiomediastinal silhouette. No radiographic finding of acute displaced fracture. IMPRESSION: 1. Diffuse bilateral interstitial and patchy opacities, which may represent atelectasis, pulmonary edema, or multifocal infection. 2. Blunting of the bilateral costophrenic angles, which may represent small pleural effusions. Electronically Signed   By: Agustin Cree M.D.   On: 03/26/2023  13:36        Scheduled Meds:  aspirin EC  81 mg Oral Daily   enoxaparin (LOVENOX) injection  0.5 mg/kg Subcutaneous Q24H   furosemide  20 mg Oral Daily   hydrocortisone   Topical BID   insulin  aspart  0-5 Units Subcutaneous QHS   insulin aspart  0-9 Units Subcutaneous TID WC   lisinopril  2.5 mg Oral Daily   oseltamivir  30 mg Oral BID   predniSONE  40 mg Oral Q breakfast   Continuous Infusions:  cefTRIAXone (ROCEPHIN)  IV 1 g (03/27/23 1842)   metronidazole Stopped (03/27/23 2200)     LOS: 2 days       Charise Killian, MD [facility] Pager [phone]  If 7PM-7AM, please contact night-coverage [email or web address] 03/28/2023, 9:15 AM

## 2023-03-28 NOTE — Plan of Care (Signed)

## 2023-03-28 NOTE — Evaluation (Signed)
 Occupational Therapy Evaluation Patient Details Name: Derek Lynch MRN: [id number] DOB: 08-04-1940 Today's Date: 03/28/2023   History of Present Illness   Derek Lynch is a 83 y.o. male with medical history significant of HTN, HLD, IIDM, chronic ambulatory dysfunction, multiple always, presented with 2 days of cough shortness of breath wheezing malaise weakness and fall.     Clinical Impressions Patient presenting with decreased Ind in self care,balance, functional mobility/transfers, endurance, and safety awareness.  Patient reports living home alone but likely poor historian secondary to cognitive deficits. Pt appers very confused with tangential speech needing redirection to task. Total A for bed mobility with pt needing increased time and max multimodal cues to follow commands. B hand mitts donned before exiting. Patient will benefit from acute OT to increase overall independence in the areas of ADLs, functional mobility, and safety awareness in order to safely discharge.     If plan is discharge home, recommend the following:   Two people to help with walking and/or transfers;Two people to help with bathing/dressing/bathroom;Assistance with cooking/housework;Assist for transportation;Help with stairs or ramp for entrance;Direct supervision/assist for financial management;Direct supervision/assist for medications management;Supervision due to cognitive status     Functional Status Assessment   Patient has had a recent decline in their functional status and demonstrates the ability to make significant improvements in function in a reasonable and predictable amount of time.     Equipment Recommendations   Other (comment) (defer to next venue of care)      Precautions/Restrictions   Precautions Precautions: Fall     Mobility Bed Mobility Overal bed mobility: Needs Assistance Bed Mobility: Rolling Rolling: Total assist              Transfers                               ADL either performed or assessed with clinical judgement   ADL Overall ADL's : Needs assistance/impaired                                       General ADL Comments: set up A to wash face and min A to take sips from cup safely.     Vision Patient Visual Report: No change from baseline              Pertinent Vitals/Pain Pain Assessment Pain Assessment: Faces Faces Pain Scale: Hurts a little bit Pain Descriptors / Indicators: Discomfort Pain Intervention(s): Limited activity within patient's tolerance, Monitored during session, Repositioned     Extremity/Trunk Assessment Upper Extremity Assessment Upper Extremity Assessment: Generalized weakness   Lower Extremity Assessment Lower Extremity Assessment: Generalized weakness       Communication Communication Communication: Impaired Factors Affecting Communication: Hearing impaired   Cognition Arousal: Lethargic Behavior During Therapy: Impulsive Cognition: No family/caregiver present to determine baseline                               Following commands: Impaired Following commands impaired: Follows one step commands inconsistently, Follows one step commands with increased time     Cueing  General Comments   Cueing Techniques: Verbal cues;Tactile cues;Visual cues;Gestural cues              Home Living Family/patient expects to be discharged to:: Private residence Living Arrangements: Alone  Type of Home: Apartment Home Access: Level entry     Home Layout: One level                          Prior Functioning/Environment Prior Level of Function : Patient poor historian/Family not available                    OT Problem List: Decreased strength;Decreased activity tolerance;Decreased safety awareness;Impaired balance (sitting and/or standing);Decreased knowledge of use of DME or AE   OT Treatment/Interventions: Self-care/ADL  training;Therapeutic exercise;Therapeutic activities;Cognitive remediation/compensation;Energy conservation;DME and/or AE instruction;Patient/family education;Balance training      OT Goals(Current goals can be found in the care plan section)   Acute Rehab OT Goals Patient Stated Goal: none stated OT Goal Formulation: Patient unable to participate in goal setting Time For Goal Achievement: 04/11/23 Potential to Achieve Goals: Fair ADL Goals Pt Will Perform Grooming: with supervision;sitting Pt Will Perform Lower Body Dressing: with min assist;sit to/from stand Pt Will Transfer to Toilet: with min assist;stand pivot transfer Pt Will Perform Toileting - Clothing Manipulation and hygiene: with min assist;sit to/from stand   OT Frequency:  Min 1X/week       AM-PAC OT "6 Clicks" Daily Activity     Outcome Measure Help from another person eating meals?: A Little Help from another person taking care of personal grooming?: A Little Help from another person toileting, which includes using toliet, bedpan, or urinal?: Total Help from another person bathing (including washing, rinsing, drying)?: A Lot Help from another person to put on and taking off regular upper body clothing?: A Lot Help from another person to put on and taking off regular lower body clothing?: Total 6 Click Score: 12   End of Session    Activity Tolerance: Patient limited by fatigue Patient left: in bed;with call bell/phone within reach;with bed alarm set  OT Visit Diagnosis: Unsteadiness on feet (R26.81);Muscle weakness (generalized) (M62.81)                Time: 3235-5732 OT Time Calculation (min): 12 min Charges:  OT General Charges $OT Visit: 1 Visit OT Evaluation $OT Eval Moderate Complexity: 1 [address] [place], OTR/L , [facility]IS ascom [phone]  03/28/23, 4:16 PM

## 2023-03-28 NOTE — Progress Notes (Signed)
 Patient is only oriented to person mostly. Pulled out IV, tele leads and purewick. Placed mittens and he pulled those off. Replaced all medical equipment and tried reorienting patient.Patient denied pain.

## 2023-03-28 NOTE — Progress Notes (Signed)
 Pt placed on Bipap due to dyspnea & desats on [place].  Immediate improvement noted

## 2023-03-29 DIAGNOSIS — J9601 Acute respiratory failure with hypoxia: Secondary | ICD-10-CM | POA: Diagnosis not present

## 2023-03-29 LAB — GLUCOSE, CAPILLARY
Glucose-Capillary: 119 mg/dL — ABNORMAL HIGH (ref 70–99)
Glucose-Capillary: 187 mg/dL — ABNORMAL HIGH (ref 70–99)
Glucose-Capillary: 223 mg/dL — ABNORMAL HIGH (ref 70–99)
Glucose-Capillary: 239 mg/dL — ABNORMAL HIGH (ref 70–99)

## 2023-03-29 LAB — CBC
HCT: 31.7 % — ABNORMAL LOW (ref 39.0–52.0)
Hemoglobin: 10.9 g/dL — ABNORMAL LOW (ref 13.0–17.0)
MCH: 31.4 pg (ref 26.0–34.0)
MCHC: 34.4 g/dL (ref 30.0–36.0)
MCV: 91.4 fL (ref 80.0–100.0)
Platelets: 173 10*3/uL (ref 150–400)
RBC: 3.47 MIL/uL — ABNORMAL LOW (ref 4.22–5.81)
RDW: 13.2 % (ref 11.5–15.5)
WBC: 5 10*3/uL (ref 4.0–10.5)
nRBC: 0 % (ref 0.0–0.2)

## 2023-03-29 LAB — COMPREHENSIVE METABOLIC PANEL
ALT: 48 U/L — ABNORMAL HIGH (ref 0–44)
AST: 83 U/L — ABNORMAL HIGH (ref 15–41)
Albumin: 2.7 g/dL — ABNORMAL LOW (ref 3.5–5.0)
Alkaline Phosphatase: 25 U/L — ABNORMAL LOW (ref 38–126)
Anion gap: 13 (ref 5–15)
BUN: 24 mg/dL — ABNORMAL HIGH (ref 8–23)
CO2: 28 mmol/L (ref 22–32)
Calcium: 8.3 mg/dL — ABNORMAL LOW (ref 8.9–10.3)
Chloride: 98 mmol/L (ref 98–111)
Creatinine, Ser: 1.18 mg/dL (ref 0.61–1.24)
GFR, Estimated: >60 mL/min (ref >60)
Glucose, Bld: 142 mg/dL — ABNORMAL HIGH (ref 70–99)
Potassium: 3.4 mmol/L — ABNORMAL LOW (ref 3.5–5.1)
Sodium: 139 mmol/L (ref 135–145)
Total Bilirubin: 0.8 mg/dL (ref 0.0–1.2)
Total Protein: 6.3 g/dL — ABNORMAL LOW (ref 6.5–8.1)

## 2023-03-29 LAB — CK: Total CK: 1781 U/L — ABNORMAL HIGH (ref 49–397)

## 2023-03-29 MED ORDER — POTASSIUM CHLORIDE CRYS ER 20 MEQ PO TBCR
20.0000 meq | EXTENDED_RELEASE_TABLET | Freq: Once | ORAL | Status: AC
  Administered 2023-03-29: 20 meq via ORAL
  Filled 2023-03-29: qty 1

## 2023-03-29 MED ORDER — GUAIFENESIN-DM 100-10 MG/5ML PO SYRP
5.0000 mL | ORAL_SOLUTION | ORAL | Status: DC | PRN
  Administered 2023-03-29 – 2023-04-03 (×5): 5 mL via ORAL
  Filled 2023-03-29 (×5): qty 10

## 2023-03-29 NOTE — Progress Notes (Signed)
 Speech Language Pathology Treatment: Dysphagia  Patient Details Name: Derek Lynch MRN: [id number] DOB: Nov 13, 1940 Today's Date: 03/29/2023 Time: 6578-4696 SLP Time Calculation (min) (ACUTE ONLY): 25 min  Assessment / Plan / Recommendation Clinical Impression  Pt seen for follow up dysphagia intervention. Pt alert, with tangential speech continued (acute encephalopathy noted in MD progress note)- though redirectable to task of eating. Therapist aided in feeding secondary to bilateral mittens in place. Trials completed of regular solids - cut with extra sauce and thin liquid via straw. Congested cough noted prior to completion of PO trials and intermittently during trials- though not overtly timed with completion of the swallow. No change to vocal quality across trials. Vitals stable for duration of trials. Overall, noted reduction of impulsivity during straw sips of thin liquids. Therapist modeling alternating solids and liquids and allowing extended time for complete manipulation in the setting of baseline edentulous nature.   Recommend regular solids with extra sauce and cut meats and thin liquids. Assistance for feeding. Pt remains at increased risk for aspiration precautions in the setting of current deconditioning and current pulmonary/respiratory state. Recommend aspiration precautions (slow rate, small bites, elevated HOB, alert for PO intake) and supervision to ensure pausing/ceasing intake in the setting of cough onset with prolonged recovery. No further acute SLP services indicated at this time.    HPI HPI: Per H&P: "Derek Lynch is a 83 y.o. male with medical history significant of HTN, HLD, IIDM, chronic ambulatory dysfunction, multiple always, presented with 2 days of cough shortness of breath wheezing malaise weakness and fall. Patient lives by himself.  And this time he seems to have sore throat, dry cough since yesterday with increasing shortness of breath.  This morning he started to  feel very unsteady and lightheaded and fell on right side and hit his right arm.  Denied any head injury, but unsure about whether he lost consciousness or not.  When EMS found him he was found to be hypoxic O2 saturation 77% and very confused, and EMS gave breathing treatment and put him on NRB." CXR 03/27/23: Cardiomegaly with vascular congestion and small right pleural effusion. CT Head 03/26/23:  No acute intracranial pathology. 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      SLP Plan  All goals met      Recommendations for follow up therapy are one component of a multi-disciplinary discharge planning process, led by the attending physician.  Recommendations may be updated based on patient status, additional functional criteria and insurance authorization.    Recommendations  Diet recommendations: Regular;Thin liquid Liquids provided via: Straw;Cup Medication Administration: Whole meds with liquid Supervision: Staff to assist with self feeding;Intermittent supervision to cue for compensatory strategies Compensations: Minimize environmental distractions;Slow rate;Small sips/bites;Follow solids with liquid Postural Changes and/or Swallow Maneuvers: Seated upright 90 degrees                  Oral care BID   Set up Supervision/Assistance (for PO intake) Dysphagia, oropharyngeal phase (R13.12)     All goals met    [place] Octaviano Mukai Clapp, MS, CCC-SLP Speech Language Pathologist Rehab Services; [facility] [phone] (ascom)   [place] J Clapp  03/29/2023, 9:38 AM

## 2023-03-29 NOTE — Plan of Care (Signed)

## 2023-03-29 NOTE — Care Management Important Message (Signed)
 Important Message  Patient Details  Name: Derek Lynch MRN: [id number] Date of Birth: [birth year]   Important Message Given:  Yes - Medicare IM     Cristela Blue, CMA 03/29/2023, 12:32 PM

## 2023-03-29 NOTE — TOC Progression Note (Signed)
 Transition of Care [facility]) - Progression Note    Patient Details  Name: Derek Lynch MRN: [id number] Date of Birth: May 24, 1940  [facility] of [facility]) CM/SW Contact  Truddie Hidden, RN Phone Number: 03/29/2023, 4:11 PM  Clinical Narrative:    Attempt to reach patient's friend, [name] regarding discharge. No answer. Left a message.          Expected Discharge Plan and Services                                               Social Determinants of Health (SDOH) Interventions SDOH Screenings   Food Insecurity: No Food Insecurity (03/26/2023)  Housing: Low Risk  (03/26/2023)  Transportation Needs: No Transportation Needs (03/26/2023)  Utilities: Not At Risk (03/26/2023)  Financial Resource Strain: Low Risk  (10/29/2022)   Received from [facility]  Social Connections: Unknown (03/26/2023)  Tobacco Use: Medium Risk (03/26/2023)    Readmission Risk Interventions     No data to display

## 2023-03-29 NOTE — Progress Notes (Addendum)
 PROGRESS NOTE    Derek Lynch  UEA:[id number] DOB: [birth year] DOA: 03/26/2023 PCP: Cheryll Dessert, FNP   Assessment & Plan:   Principal Problem:   Hypoxia Active Problems:   Fall   Influenza A with pneumonia   Acute metabolic encephalopathy   CHF (congestive heart failure) (HCC)  Assessment and Plan: Sepsis: with acute endorgan damage. Met criteria w/ hypoxia, tachypnea, elevated lactic acid & likely secondary to pneumonia. Continue on IV abxs, bronchodilators & encourage incentive spirometry. Weaned off of BiPAP. Sepsis resolved   Influenza pneumonia: continue on tamiflu, steroids. Droplet precautions    Aspiration pneumonia: continue on IV rocephin, flagyl, bronchodilators & encourage incentive spirometry    Acute hypoxic respiratory failure: likely secondary to pneumonia. CTA chest was neg for PE. Weaned off of BiPAP. Continue on supplemental oxygen and wean as tolerated   Acute encephalopathy: likely secondary to above infections. Mental status much improved today, AA&Ox3.    Questionable syncope: echo shows EF 55-60%, diastolic function is indeterminate & no regional wall motion abnormalities. PT/OT recs SNF.    Rhabdomyolysis: CK is trending down today   Hypokalemia: potassium given    DM2: likely poorly controlled, HbA1c 8.5. Continue on SSI w/ accuchecks     AKI: secondary to sepsis & rhabdomyolysis. 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 Would benefit from weight loss     DVT prophylaxis: lovenox  Code Status: DNR Family Communication: Disposition Plan: likely d/c to SNF  Level of care: Progressive  Status is: Inpatient Remains inpatient appropriate because: severity of illness, requiring IV abxs     Consultants:    Procedures:   Antimicrobials: rocephin, flagyl   Subjective: Pt c/o fatigue   Objective: Vitals:   03/28/23 1656 03/28/23 1939 03/29/23 0349 03/29/23  0741  BP: 112/68 119/68 99/69 101/68  Pulse: (!) 56 62 60 (!) 59  Resp: 18 16 16 18   Temp: 98.5 F (36.9 C) 98.4 F (36.9 C) 98 F (36.7 C) 98.3 F (36.8 C)  TempSrc: Oral Oral Oral   SpO2: 99% 98% 95% 96%  Weight:      Height:        Intake/Output Summary (Last 24 hours) at 03/29/2023 0842 Last data filed at 03/28/2023 2222 Gross per 24 hour  Intake 240 ml  Output 1001 ml  Net -761 ml   Filed Weights   03/26/23 1054  Weight: 88.4 kg    Examination:  General exam: appears calm & comfortable  Respiratory system: course breath sounds b/l  Cardiovascular system: S1 & S2+. No rubs or clicks  Gastrointestinal system: abd is soft, NT, obese & hypoactive bowel sounds  Central nervous system: AA&Ox3. Moves all extremities  Psychiatry: judgement and insight appears improved. Flat mood and affect    Data Reviewed: I have personally reviewed following labs and imaging studies  CBC: Recent Labs  Lab 03/26/23 1048 03/27/23 0428 03/28/23 0425 03/29/23 0520  WBC 8.5 6.5 7.1 5.0  NEUTROABS 6.9  --   --   --   HGB 12.6* 11.5* 10.9* 10.9*  HCT 37.8* 33.7* 31.7* 31.7*  MCV 94.3 91.6 90.8 91.4  PLT 199 176 178 173   Basic Metabolic Panel: Recent Labs  Lab 03/26/23 1048 03/27/23 0428 03/28/23 0425 03/29/23 0520  NA 137 132* 135 139  K 4.3 4.2 3.5 3.4*  CL 98 98 99 98  CO2 24 22 25 28   GLUCOSE 242* 268* 150*  142*  BUN 18 19 23  24*  CREATININE 1.43* 1.36* 1.20 1.18  CALCIUM 8.7* 8.0* 8.1* 8.3*   GFR: Estimated Creatinine Clearance: 50.1 mL/min (by C-G formula based on SCr of 1.18 mg/dL). Liver Function Tests: Recent Labs  Lab 03/26/23 1048 03/28/23 0425 03/29/23 0520  AST 99* 124* 83*  ALT 41 51* 48*  ALKPHOS 33* 25* 25*  BILITOT 0.7 0.6 0.8  PROT 7.9 6.6 6.3*  ALBUMIN 3.5 2.7* 2.7*   Recent Labs  Lab 03/26/23 1048  LIPASE 24   No results for input(s): "AMMONIA" in the last 168 hours. Coagulation Profile: Recent Labs  Lab 03/26/23 1048  INR 1.1    Cardiac Enzymes: Recent Labs  Lab 03/26/23 1329 03/27/23 0428 03/28/23 0425 03/29/23 0520  CKTOTAL 4,531* 6,211* 4,272* 1,781*   BNP (last 3 results) No results for input(s): "PROBNP" in the last 8760 hours. HbA1C: Recent Labs    03/26/23 1048  HGBA1C 8.5*   CBG: Recent Labs  Lab 03/28/23 0818 03/28/23 1203 03/28/23 1648 03/28/23 2157 03/29/23 0742  GLUCAP 130* 154* 218* 207* 119*   Lipid Profile: No results for input(s): "CHOL", "HDL", "LDLCALC", "TRIG", "CHOLHDL", "LDLDIRECT" in the last 72 hours. Thyroid Function Tests: No results for input(s): "TSH", "T4TOTAL", "FREET4", "T3FREE", "THYROIDAB" in the last 72 hours. Anemia Panel: No results for input(s): "VITAMINB12", "FOLATE", "FERRITIN", "TIBC", "IRON", "RETICCTPCT" in the last 72 hours. Sepsis Labs: Recent Labs  Lab 03/26/23 1048 03/26/23 1329  LATICACIDVEN 3.6* 2.2*    Recent Results (from the past 240 hours)  Blood Culture (routine x 2)     Status: None (Preliminary result)   Collection Time: 03/26/23 10:48 AM   Specimen: Right Antecubital; Blood  Result Value Ref Range Status   Specimen Description RIGHT ANTECUBITAL  Final   Special Requests   Final    Blood Culture results may not be optimal due to an inadequate volume of blood received in culture bottles   Culture   Final    NO GROWTH 2 DAYS Performed at [facility], [address] [place], [place] [address]    Report Status PENDING  Incomplete  Blood Culture (routine x 2)     Status: None (Preliminary result)   Collection Time: 03/26/23 10:48 AM   Specimen: BLOOD  Result Value Ref Range Status   Specimen Description BLOOD BLOOD RIGHT HAND  Final   Special Requests   Final    Blood Culture results may not be optimal due to an inadequate volume of blood received in culture bottles   Culture   Final    NO GROWTH 2 DAYS Performed at [facility], [address], [place], [place] [address]    Report Status PENDING   Incomplete  Resp panel by RT-PCR (RSV, Flu A&B, Covid) Anterior Nasal Swab     Status: Abnormal   Collection Time: 03/26/23 10:48 AM   Specimen: Anterior Nasal Swab  Result Value Ref Range Status   SARS Coronavirus 2 by RT PCR NEGATIVE NEGATIVE Final    Comment: (NOTE) SARS-CoV-2 target nucleic acids are NOT DETECTED.  The SARS-CoV-2 RNA is generally detectable in upper respiratory specimens during the acute phase of infection. The lowest concentration of SARS-CoV-2 viral copies this assay can detect is 138 copies/mL. A negative result does not preclude SARS-Cov-2 infection and should not be used as the sole basis for treatment or other patient management decisions. A negative result may occur with  improper specimen collection/handling, submission of specimen other than nasopharyngeal  swab, presence of viral mutation(s) within the areas targeted by this assay, and inadequate number of viral copies(<138 copies/mL). A negative result must be combined with clinical observations, patient history, and epidemiological information. The expected result is Negative.  Fact Sheet for Patients:  BloggerCourse.com  Fact Sheet for Healthcare Providers:  SeriousBroker.it  This test is no t yet approved or cleared by the [place] FDA and  has been authorized for detection and/or diagnosis of SARS-CoV-2 by FDA under an Emergency Use Authorization (EUA). This EUA will remain  in effect (meaning this test can be used) for the duration of the COVID-19 declaration under Section 564(b)(1) of the Act, 21 U.S.C.section 360bbb-3(b)(1), unless the authorization is terminated  or revoked sooner.       Influenza A by PCR POSITIVE (A) NEGATIVE Final   Influenza B by PCR NEGATIVE NEGATIVE Final    Comment: (NOTE) The Xpert Xpress SARS-CoV-2/FLU/RSV plus assay is intended as an aid in the diagnosis of influenza from Nasopharyngeal swab specimens  and should not be used as a sole basis for treatment. Nasal washings and aspirates are unacceptable for Xpert Xpress SARS-CoV-2/FLU/RSV testing.  Fact Sheet for Patients: BloggerCourse.com  Fact Sheet for Healthcare Providers: SeriousBroker.it  This test is not yet approved or cleared by the [place] FDA and has been authorized for detection and/or diagnosis of SARS-CoV-2 by FDA under an Emergency Use Authorization (EUA). This EUA will remain in effect (meaning this test can be used) for the duration of the COVID-19 declaration under Section 564(b)(1) of the Act, 21 U.S.C. section 360bbb-3(b)(1), unless the authorization is terminated or revoked.     Resp Syncytial Virus by PCR NEGATIVE NEGATIVE Final    Comment: (NOTE) Fact Sheet for Patients: BloggerCourse.com  Fact Sheet for Healthcare Providers: SeriousBroker.it  This test is not yet approved or cleared by the [place] FDA and has been authorized for detection and/or diagnosis of SARS-CoV-2 by FDA under an Emergency Use Authorization (EUA). This EUA will remain in effect (meaning this test can be used) for the duration of the COVID-19 declaration under Section 564(b)(1) of the Act, 21 U.S.C. section 360bbb-3(b)(1), unless the authorization is terminated or revoked.  Performed at [facility], [address] [place], [place] [address]   MRSA Next Gen by PCR, Nasal     Status: None   Collection Time: 03/26/23  3:49 PM   Specimen: Nasal Mucosa; Nasal Swab  Result Value Ref Range Status   MRSA by PCR Next Gen NOT DETECTED NOT DETECTED Final    Comment: (NOTE) The GeneXpert MRSA Assay (FDA approved for NASAL specimens only), is one component of a comprehensive MRSA colonization surveillance program. It is not intended to diagnose MRSA infection nor to guide or monitor treatment for MRSA  infections. Test performance is not FDA approved in patients less than 48 years old. Performed at [facility], [address] [place], [place] [address]          Radiology Studies: ECHOCARDIOGRAM COMPLETE Result Date: 03/27/2023    ECHOCARDIOGRAM REPORT   Patient Name:   Derek Lynch Date of Exam: 03/27/2023 Medical Rec #:  [id number]     Height:       64.0 in Accession #:    [id number]    Weight:       194.9 lb Date of Birth:  16-Sep-1940    BSA:          1.935 m Patient Age:    3 years  BP:           152/92 mmHg Patient Gender: M             HR:           90 bpm. Exam Location:  [facility] Procedure: 2D Echo, Cardiac Doppler and Color Doppler (Both Spectral and Color            Flow Doppler were utilized during procedure). Indications:     CHF I50.31  History:         Patient has no prior history of Echocardiogram examinations.  Sonographer:     Overton Mam RDCS, FASE Referring Phys:  [id number] Emeline General Diagnosing Phys: Julien Nordmann MD  Sonographer Comments: Technically difficult study due to poor echo windows and no subcostal window. Image acquisition challenging due to patient body habitus and Image acquisition challenging due to respiratory motion. IMPRESSIONS  1. Left ventricular ejection fraction, by estimation, is 55 to 60%. Left ventricular ejection fraction by PLAX is 56 %. The left ventricle has normal function. The left ventricle has no regional wall motion abnormalities. There is mild left ventricular hypertrophy. Left ventricular diastolic parameters are indeterminate.  2. Right ventricular systolic function is normal. The right ventricular size is normal.  3. The mitral valve is normal in structure. No evidence of mitral valve regurgitation. No evidence of mitral stenosis.  4. The aortic valve is normal in structure. There is mild calcification of the aortic valve. Aortic valve regurgitation is not visualized. Aortic valve sclerosis is present, with no evidence of aortic  valve stenosis.  5. The inferior vena cava is normal in size with greater than 50% respiratory variability, suggesting right atrial pressure of 3 mmHg. FINDINGS  Left Ventricle: Left ventricular ejection fraction, by estimation, is 55 to 60%. Left ventricular ejection fraction by PLAX is 56 %. The left ventricle has normal function. The left ventricle has no regional wall motion abnormalities. Global longitudinal strain performed but not reported based on interpreter judgement due to suboptimal tracking. The left ventricular internal cavity size was normal in size. There is mild left ventricular hypertrophy. Left ventricular diastolic parameters are indeterminate. Right Ventricle: The right ventricular size is normal. No increase in right ventricular wall thickness. Right ventricular systolic function is normal. Left Atrium: Left atrial size was normal in size. Right Atrium: Right atrial size was normal in size. Pericardium: There is no evidence of pericardial effusion. Mitral Valve: The mitral valve is normal in structure. No evidence of mitral valve regurgitation. No evidence of mitral valve stenosis. Tricuspid Valve: The tricuspid valve is normal in structure. Tricuspid valve regurgitation is not demonstrated. No evidence of tricuspid stenosis. Aortic Valve: The aortic valve is normal in structure. There is mild calcification of the aortic valve. Aortic valve regurgitation is not visualized. Aortic valve sclerosis is present, with no evidence of aortic valve stenosis. Aortic valve peak gradient  measures 13.8 mmHg. Pulmonic Valve: The pulmonic valve was normal in structure. Pulmonic valve regurgitation is not visualized. No evidence of pulmonic stenosis. Aorta: The aortic root is normal in size and structure. Venous: The inferior vena cava is normal in size with greater than 50% respiratory variability, suggesting right atrial pressure of 3 mmHg. IAS/Shunts: No atrial level shunt detected by color flow Doppler.  Additional Comments: 3D was performed not requiring image post processing on an independent workstation and was indeterminate.  LEFT VENTRICLE PLAX 2D LV EF:         Left  Diastology                ventricular     LV e' medial:    9.46 cm/s                ejection        LV E/e' medial:  11.7                fraction by     LV e' lateral:   12.60 cm/s                PLAX is 56      LV E/e' lateral: 8.8                %. LVIDd:         3.20 cm LVIDs:         2.30 cm LV PW:         1.30 cm LV IVS:        1.30 cm LVOT diam:     2.00 cm LV SV:         73 LV SV Index:   38 LVOT Area:     3.14 cm  RIGHT VENTRICLE RV Basal diam:  3.50 cm RV S prime:     15.60 cm/s TAPSE (M-mode): 1.9 cm LEFT ATRIUM             Index        RIGHT ATRIUM           Index LA diam:        2.90 cm 1.50 cm/m   RA Area:     18.70 cm LA Vol (A2C):   33.2 ml 17.16 ml/m  RA Volume:   50.90 ml  26.31 ml/m LA Vol (A4C):   25.5 ml 13.18 ml/m LA Biplane Vol: 32.4 ml 16.74 ml/m  AORTIC VALVE                 PULMONIC VALVE AV Area (Vmax): 1.89 cm     PV Vmax:       1.28 m/s AV Vmax:        186.00 cm/s  PV Peak grad:  6.6 mmHg AV Peak Grad:   13.8 mmHg LVOT Vmax:      112.00 cm/s LVOT Vmean:     74.200 cm/s LVOT VTI:       0.231 m  AORTA Ao Root diam: 3.60 cm Ao Asc diam:  3.50 cm MITRAL VALVE MV Area (PHT): 3.77 cm     SHUNTS MV Decel Time: 201 msec     Systemic VTI:  0.23 m MV E velocity: 111.00 cm/s  Systemic Diam: 2.00 cm MV A velocity: 93.80 cm/s MV E/A ratio:  1.18 Julien Nordmann MD Electronically signed by Julien Nordmann MD Signature Date/Time: 03/27/2023/11:52:05 AM    Final         Scheduled Meds:  aspirin EC  81 mg Oral Daily   enoxaparin (LOVENOX) injection  0.5 mg/kg Subcutaneous Q24H   furosemide  40 mg Intravenous Daily   hydrocortisone   Topical BID   insulin aspart  0-5 Units Subcutaneous QHS   insulin aspart  0-9 Units Subcutaneous TID WC   lisinopril  2.5 mg Oral Daily   methylPREDNISolone (SOLU-MEDROL)  injection  40 mg Intravenous Daily   oseltamivir  30 mg Oral BID   Continuous Infusions:  cefTRIAXone (ROCEPHIN)  IV 1 g (03/28/23 1750)   metronidazole 500 mg (03/28/23 2230)  LOS: 3 days       Charise Killian, MD [facility] Pager [phone]  If 7PM-7AM, please contact night-coverage [email or web address] 03/29/2023, 8:42 AM

## 2023-03-29 NOTE — Progress Notes (Signed)
 Patient is alert to self only. Received a bath and had a bowel movement on 3/9. Blood glucose was 207 last night and he received 2 units of insulin. Patient denied pain and no pain indicators were assessed.

## 2023-03-30 DIAGNOSIS — J9601 Acute respiratory failure with hypoxia: Secondary | ICD-10-CM | POA: Diagnosis not present

## 2023-03-30 LAB — CBC
HCT: 33 % — ABNORMAL LOW (ref 39.0–52.0)
Hemoglobin: 11.4 g/dL — ABNORMAL LOW (ref 13.0–17.0)
MCH: 31.1 pg (ref 26.0–34.0)
MCHC: 34.5 g/dL (ref 30.0–36.0)
MCV: 89.9 fL (ref 80.0–100.0)
Platelets: 194 10*3/uL (ref 150–400)
RBC: 3.67 MIL/uL — ABNORMAL LOW (ref 4.22–5.81)
RDW: 13.2 % (ref 11.5–15.5)
WBC: 5.1 10*3/uL (ref 4.0–10.5)
nRBC: 0 % (ref 0.0–0.2)

## 2023-03-30 LAB — COMPREHENSIVE METABOLIC PANEL
ALT: 45 U/L — ABNORMAL HIGH (ref 0–44)
AST: 55 U/L — ABNORMAL HIGH (ref 15–41)
Albumin: 2.8 g/dL — ABNORMAL LOW (ref 3.5–5.0)
Alkaline Phosphatase: 28 U/L — ABNORMAL LOW (ref 38–126)
Anion gap: 12 (ref 5–15)
BUN: 24 mg/dL — ABNORMAL HIGH (ref 8–23)
CO2: 27 mmol/L (ref 22–32)
Calcium: 8.3 mg/dL — ABNORMAL LOW (ref 8.9–10.3)
Chloride: 100 mmol/L (ref 98–111)
Creatinine, Ser: 1.07 mg/dL (ref 0.61–1.24)
GFR, Estimated: >60 mL/min (ref >60)
Glucose, Bld: 146 mg/dL — ABNORMAL HIGH (ref 70–99)
Potassium: 3.4 mmol/L — ABNORMAL LOW (ref 3.5–5.1)
Sodium: 139 mmol/L (ref 135–145)
Total Bilirubin: 0.6 mg/dL (ref 0.0–1.2)
Total Protein: 6.4 g/dL — ABNORMAL LOW (ref 6.5–8.1)

## 2023-03-30 LAB — GLUCOSE, CAPILLARY
Glucose-Capillary: 130 mg/dL — ABNORMAL HIGH (ref 70–99)
Glucose-Capillary: 308 mg/dL — ABNORMAL HIGH (ref 70–99)
Glucose-Capillary: 240 mg/dL — ABNORMAL HIGH (ref 70–99)
Glucose-Capillary: 261 mg/dL — ABNORMAL HIGH (ref 70–99)
Glucose-Capillary: 207 mg/dL — ABNORMAL HIGH (ref 70–99)

## 2023-03-30 LAB — CK: Total CK: 643 U/L — ABNORMAL HIGH (ref 49–397)

## 2023-03-30 MED ORDER — POTASSIUM CHLORIDE CRYS ER 20 MEQ PO TBCR
20.0000 meq | EXTENDED_RELEASE_TABLET | Freq: Once | ORAL | Status: AC
  Administered 2023-03-30: 20 meq via ORAL
  Filled 2023-03-30: qty 1

## 2023-03-30 NOTE — Progress Notes (Signed)
 Physical Therapy Treatment Patient Details Name: Derek Lynch MRN: [id number] DOB: [birth year] Today's Date: 03/30/2023   History of Present Illness Derek Lynch is a 83 y.o. male with medical history significant of HTN, HLD, IIDM, chronic ambulatory dysfunction, multiple always, presented with 2 days of cough shortness of breath wheezing malaise weakness and fall.    PT Comments  Patient supine in bed on arrival and agreeable to PT/OT co-tx session. Patient required maxA for bed mobility and able to maintain sitting balance with supervision. Able to stand from EOB and recliner with modA+2 with consistent cueing for hand placement. Able to transfer to recliner with minA+2 and then progress to ambulating 6' with RW and minA+2. Discharge plan remains appropriate.   If plan is discharge home, recommend the following: Two people to help with walking and/or transfers;Two people to help with bathing/dressing/bathroom;Help with stairs or ramp for entrance;Assist for transportation;Assistance with cooking/housework   Can travel by private vehicle     No  Equipment Recommendations  Other (comment) (TBD by next venue of care.)    Recommendations for Other Services       Precautions / Restrictions Precautions Precautions: Fall Recall of Precautions/Restrictions: Impaired Restrictions Weight Bearing Restrictions Per Provider Order: No     Mobility  Bed Mobility Overal bed mobility: Needs Assistance Bed Mobility: Supine to Sit     Supine to sit: Max assist     General bed mobility comments: required assist for all aspects of bed mobility. Able to initiate BLE movement towards EOB but required assist to complete bringing LEs off EOB and trunk elevation    Transfers Overall transfer level: Needs assistance Equipment used: Rolling Phuong Moffatt (2 wheels) Transfers: Sit to/from Stand, Bed to chair/wheelchair/BSC Sit to Stand: Mod assist, +2 physical assistance, +2 safety/equipment   Step  pivot transfers: Min assist, +2 physical assistance, +2 safety/equipment       General transfer comment: cues for hand placement with patient reporting he is unable to do so and "that's not how I do it at home". Asked patient to stand as if he was at home. Patient reaching for arm rests of the chair to push as therapist asked him to do previously    Ambulation/Gait Ambulation/Gait assistance: Min assist, +2 physical assistance, +2 safety/equipment Gait Distance (Feet): 6 Feet Assistive device: Rolling Jaisha Villacres (2 wheels) Gait Pattern/deviations: Step-to pattern, Decreased stride length, Trunk flexed Gait velocity: decreased     General Gait Details: assist for balance especially during backwards stepping due to posterior bias.   Stairs             Wheelchair Mobility     Tilt Bed    Modified Rankin (Stroke Patients Only)       Balance Overall balance assessment: Needs assistance Sitting-balance support: No upper extremity supported, Feet supported Sitting balance-Leahy Scale: Fair     Standing balance support: Bilateral upper extremity supported, During functional activity Standing balance-Leahy Scale: Poor                              Communication Communication Communication: Impaired Factors Affecting Communication: Hearing impaired  Cognition Arousal: Alert Behavior During Therapy: Flat affect   PT - Cognitive impairments: No family/caregiver present to determine baseline                       PT - Cognition Comments: oriented to self and time. Unsure of place initially and  unable to tell about situation. Poor safety awareness and follows commands inconsistently with increased time. Self limiting and asking for therapist to feed him at end of session despite full function of B UEs. Following commands: Impaired Following commands impaired: Follows one step commands inconsistently, Follows one step commands with increased time     Cueing Cueing Techniques: Verbal cues, Tactile cues  Exercises      General Comments General comments (skin integrity, edema, etc.): VSS on 4L O2      Pertinent Vitals/Pain Pain Assessment Pain Assessment: Faces Faces Pain Scale: Hurts a little bit Pain Location: generalized Pain Descriptors / Indicators: Discomfort Pain Intervention(s): Monitored during session, Repositioned    Home Living                          Prior Function            PT Goals (current goals can now be found in the care plan section) Acute Rehab PT Goals Patient Stated Goal: did not state PT Goal Formulation: Patient unable to participate in goal setting Time For Goal Achievement: 04/13/23 Potential to Achieve Goals: Fair Progress towards PT goals: Progressing toward goals    Frequency    Min 1X/week      PT Plan      Co-evaluation PT/OT/SLP Co-Evaluation/Treatment: Yes Reason for Co-Treatment: Necessary to address cognition/behavior during functional activity;For patient/therapist safety;To address functional/ADL transfers PT goals addressed during session: Mobility/safety with mobility;Proper use of DME        AM-PAC PT "6 Clicks" Mobility   Outcome Measure  Help needed turning from your back to your side while in a flat bed without using bedrails?: A Lot Help needed moving from lying on your back to sitting on the side of a flat bed without using bedrails?: A Lot Help needed moving to and from a bed to a chair (including a wheelchair)?: Total Help needed standing up from a chair using your arms (e.g., wheelchair or bedside chair)?: Total Help needed to walk in hospital room?: Total Help needed climbing 3-5 steps with a railing? : Total 6 Click Score: 8    End of Session Equipment Utilized During Treatment: Oxygen Activity Tolerance: Patient tolerated treatment well Patient left: in chair;with call bell/phone within reach;with chair alarm set Nurse Communication:  Mobility status PT Visit Diagnosis: Unsteadiness on feet (R26.81);Other abnormalities of gait and mobility (R26.89);Repeated falls (R29.6);Muscle weakness (generalized) (M62.81);History of falling (Z91.81);Difficulty in walking, not elsewhere classified (R26.2)     Time: 9147-8295 PT Time Calculation (min) (ACUTE ONLY): 35 min  Charges:    $Therapeutic Activity: 8-22 mins PT General Charges $$ ACUTE PT VISIT: 1 Visit                     Maylon Peppers, PT, DPT Physical Therapist - [facility]  [facility]    Floyd Wade A Larosa Rhines 03/30/2023, 1:52 PM

## 2023-03-30 NOTE — [place] FL2 (Signed)
 [place] MEDICAID FL2 LEVEL OF CARE FORM     IDENTIFICATION  Patient Name: Derek Lynch Birthdate: 12-May-1940 Sex: male Admission Date (Current Location): 03/26/2023  [facility] and [place] Number:  Chiropodist and Address:  [facility], [address], [place], [place] [address]      Provider Number: [id number]  Attending Physician Name and Address:  Charise Killian, MD  Relative Name and Phone Number:  Dian Queen (Friend)  [phone] (Home Phone)    Current Level of Care: Hospital Recommended Level of Care: Skilled Nursing Facility Prior Approval Number:    Date Approved/Denied:   PASRR Number: [id number]  Discharge Plan: SNF    Current Diagnoses: Patient Active Problem List   Diagnosis Date Noted   Influenza A with pneumonia 03/26/2023   Acute metabolic encephalopathy 03/26/2023   Hypoxia 03/26/2023   CHF (congestive heart failure) (HCC) 03/26/2023   Sepsis due to cellulitis (HCC) 11/21/2021   Dyslipidemia 11/21/2021   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 (HCC) 11/21/2021   Fall 11/21/2021   Rhabdomyolysis 11/20/2021   Chronic venous insufficiency 04/14/2017   Lymphedema 04/14/2017   Pain in both lower extremities 03/17/2017   Hyperlipidemia 03/17/2017    Orientation RESPIRATION BLADDER Height & Weight     Self  O2 (024L per nasal cannula) Incontinent Weight: 88.4 kg Height:  5\' 4"  (162.6 cm)  BEHAVIORAL SYMPTOMS/MOOD NEUROLOGICAL BOWEL NUTRITION STATUS  Other (Comment) (n/a)  (n/a) Incontinent Diet (Heart Healthy/ Carb Modified)  AMBULATORY STATUS COMMUNICATION OF NEEDS Skin   Limited Assist Verbally  (blister to l upper shoulder, erythema and abrasions to bilateral lower legs)                       Personal Care Assistance Level of Assistance  Bathing, Feeding, Dressing Bathing Assistance: Limited assistance Feeding assistance: Limited assistance Dressing Assistance: Limited assistance      Functional Limitations Info  Sight, Hearing Sight Info: Impaired Hearing Info: Impaired      SPECIAL CARE FACTORS FREQUENCY  PT (By licensed PT), OT (By licensed OT)     PT Frequency: Min 2x weekly OT Frequency: Min 2x weekly            Contractures Contractures Info: Not present    Additional Factors Info  Code Status, Allergies Code Status Info: DNR Allergies Info: Penicillin G           Current Medications (03/30/2023):  This is the current hospital active medication list Current Facility-Administered Medications  Medication Dose Route Frequency Provider Last Rate Last Admin   acetaminophen (TYLENOL) tablet 650 mg  650 mg Oral Q6H PRN Mikey College T, MD       aspirin EC tablet 81 mg  81 mg Oral Daily Mikey College T, MD   81 mg at 03/30/23 0916   cefTRIAXone (ROCEPHIN) 1 g in sodium chloride 0.9 % 100 mL IVPB  1 g Intravenous Q24H Mikey College T, MD 200 mL/hr at 03/29/23 1802 1 g at 03/29/23 1802   enoxaparin (LOVENOX) injection 45 mg  0.5 mg/kg Subcutaneous Q24H Mikey College T, MD   45 mg at 03/29/23 2106   furosemide (LASIX) injection 40 mg  40 mg Intravenous Daily Charise Killian, MD   40 mg at 03/30/23 0926   guaiFENesin-dextromethorphan (ROBITUSSIN DM) 100-10 MG/5ML syrup 5 mL  5 mL Oral Q4H PRN Charise Killian, MD   5 mL at 03/30/23 0918   hydrocortisone 1 % ointment  Topical BID Emeline General, MD   Given at 03/30/23 5284   insulin aspart (novoLOG) injection 0-5 Units  0-5 Units Subcutaneous QHS Mikey College T, MD   2 Units at 03/29/23 2123   insulin aspart (novoLOG) injection 0-9 Units  0-9 Units Subcutaneous TID WC Mikey College T, MD   1 Units at 03/30/23 0913   ipratropium-albuterol (DUONEB) 0.5-2.5 (3) MG/3ML nebulizer solution 3 mL  3 mL Nebulization Q4H PRN Charise Killian, MD       lisinopril (ZESTRIL) tablet 2.5 mg  2.5 mg Oral Daily Mikey College T, MD   2.5 mg at 03/30/23 0916   methylPREDNISolone sodium succinate (SOLU-MEDROL) 40 mg/mL injection 40  mg  40 mg Intravenous Daily Charise Killian, MD   40 mg at 03/30/23 1324   metroNIDAZOLE (FLAGYL) IVPB 500 mg  500 mg Intravenous Q12H Mikey College T, MD 100 mL/hr at 03/30/23 0932 500 mg at 03/30/23 0932   ondansetron (ZOFRAN) tablet 4 mg  4 mg Oral Q6H PRN Emeline General, MD       Or   ondansetron [facility]) injection 4 mg  4 mg Intravenous Q6H PRN Mikey College T, MD       oseltamivir (TAMIFLU) capsule 30 mg  30 mg Oral BID Mikey College T, MD   30 mg at 03/30/23 4010     Discharge Medications: Please see discharge summary for a list of discharge medications.  Relevant Imaging Results:  Relevant Lab Results:   Additional Information SS# [id number]  Truddie Hidden, RN

## 2023-03-30 NOTE — Progress Notes (Signed)
 Occupational Therapy Treatment Patient Details Name: Derek Lynch MRN: [id number] DOB: 08-Dec-1940 Today's Date: 03/30/2023   History of present illness Derek Lynch is a 83 y.o. male with medical history significant of HTN, HLD, IIDM, chronic ambulatory dysfunction, multiple always, presented with 2 days of cough shortness of breath wheezing malaise weakness and fall.   OT comments  Pt received in bed, agreeable to OT/PT co-tx. MaxA for bed mobility, stands from EOB with modA +2, transfers using step > pivot with minA +2 HHA with max multimodal cuing for hand placement. Sits to perform grooming tasks in recliner, then performs short bout of mobility with minA +2 using RW for ~6 ft. Pt setup for lunch with tray in room, pt insisting that OT/PT "feed" him - pt does demonstrate cognitive deficits, potentially visual involvement as pt cannot seem to see food on plate, and impaired bilateral coordination. Discussed with RN/NT recommendation for supervision at meal times and chopped meats. Pt will require 24/7 supervision at hospital discharge due to impairments indicated, and is currently unsafe to discharge home alone. OT will continue to progress as able. Discharge recommendation remains appropriate.       If plan is discharge home, recommend the following:  Two people to help with walking and/or transfers;Two people to help with bathing/dressing/bathroom;Assistance with cooking/housework;Assist for transportation;Help with stairs or ramp for entrance;Direct supervision/assist for financial management;Direct supervision/assist for medications management;Supervision due to cognitive status   Equipment Recommendations  Other (comment)       Precautions / Restrictions Precautions Precautions: Fall Recall of Precautions/Restrictions: Impaired Restrictions Weight Bearing Restrictions Per Provider Order: No       Mobility Bed Mobility Overal bed mobility: Needs Assistance Bed Mobility: Supine  to Sit     Supine to sit: Max assist     General bed mobility comments: required assist for all aspects of bed mobility. Able to initiate BLE movement towards EOB but required assist to complete bringing LEs off EOB and trunk elevation    Transfers Overall transfer level: Needs assistance Equipment used: Rolling walker (2 wheels) Transfers: Sit to/from Stand, Bed to chair/wheelchair/BSC Sit to Stand: Mod assist, +2 physical assistance, +2 safety/equipment     Step pivot transfers: Min assist, +2 physical assistance, +2 safety/equipment     General transfer comment: cues for hand placement and to scoot hips forward in chair     Balance Overall balance assessment: Needs assistance Sitting-balance support: No upper extremity supported, Feet supported Sitting balance-Leahy Scale: Fair     Standing balance support: Bilateral upper extremity supported, During functional activity Standing balance-Leahy Scale: Poor                             ADL either performed or assessed with clinical judgement   ADL Overall ADL's : Needs assistance/impaired Eating/Feeding: Minimal assistance;Sitting Eating/Feeding Details (indicate cue type and reason): in recliner, required cues to locate lunch items, decreased bilat coordination needed to cut food, pt insisiting that OT/PT "feed" him despite able to do it himself. Difficulites using spoon / fork with intermittent congested cough Grooming: Wash/dry face;Wash/dry hands;Sitting;Set up                               Functional mobility during ADLs: +2 for safety/equipment;+2 for physical assistance;Moderate assistance;Minimal assistance;Rolling walker (2 wheels)       Communication Communication Communication: Impaired Factors Affecting Communication: Hearing impaired  Cognition Arousal: Alert Behavior During Therapy: Flat affect Cognition: No family/caregiver present to determine baseline                                Following commands: Impaired Following commands impaired: Follows one step commands inconsistently, Follows one step commands with increased time      Cueing   Cueing Techniques: Verbal cues, Tactile cues        General Comments VSS on 4L O2, updated NT / RN on mobility/rec for supervision for mealtimes / weeping noted on BLE    Pertinent Vitals/ Pain       Pain Assessment Pain Assessment: Faces Faces Pain Scale: Hurts a little bit Pain Location: generalized Pain Descriptors / Indicators: Discomfort Pain Intervention(s): Limited activity within patient's tolerance, Monitored during session         Frequency  Min 1X/week        Progress Toward Goals  OT Goals(current goals can now be found in the care plan section)  Progress towards OT goals: Progressing toward goals  Acute Rehab OT Goals OT Goal Formulation: Patient unable to participate in goal setting Time For Goal Achievement: 04/11/23 Potential to Achieve Goals: Fair ADL Goals Pt Will Perform Grooming: with supervision;sitting Pt Will Perform Lower Body Dressing: with min assist;sit to/from stand Pt Will Transfer to Toilet: with min assist;stand pivot transfer Pt Will Perform Toileting - Clothing Manipulation and hygiene: with min assist;sit to/from stand  Plan      Co-evaluation    PT/OT/SLP Co-Evaluation/Treatment: Yes Reason for Co-Treatment: Necessary to address cognition/behavior during functional activity;For patient/therapist safety;To address functional/ADL transfers PT goals addressed during session: Mobility/safety with mobility;Proper use of DME OT goals addressed during session: ADL's and self-care      AM-PAC OT "6 Clicks" Daily Activity     Outcome Measure   Help from another person eating meals?: A Little Help from another person taking care of personal grooming?: A Little Help from another person toileting, which includes using toliet, bedpan, or urinal?:  Total Help from another person bathing (including washing, rinsing, drying)?: A Lot Help from another person to put on and taking off regular upper body clothing?: A Lot Help from another person to put on and taking off regular lower body clothing?: Total 6 Click Score: 12    End of Session Equipment Utilized During Treatment: Oxygen  OT Visit Diagnosis: Unsteadiness on feet (R26.81);Muscle weakness (generalized) (M62.81)   Activity Tolerance Patient tolerated treatment well   Patient Left in chair;with call bell/phone within reach;with chair alarm set   Nurse Communication Mobility status;Other (comment) (supervision at mealtimes, mobility, VSS, and need for bed linens)        Time: 8295-6213 OT Time Calculation (min): 35 min  Charges: OT General Charges $OT Visit: 1 Visit OT Treatments $Self Care/Home Management : 8-22 mins  Haik Mahoney L. Cache Decoursey, OTR/L  03/30/23, 4:12 PM

## 2023-03-30 NOTE — Inpatient Diabetes Management (Signed)
 Inpatient Diabetes Program Recommendations  AACE/ADA: New Consensus Statement on Inpatient Glycemic Control (2015)  Target Ranges:  Prepandial:   less than 140 mg/dL      Peak postprandial:   less than 180 mg/dL (1-2 hours)      Critically ill patients:  140 - 180 mg/dL   Lab Results  Component Value Date   GLUCAP 308 (H) 03/30/2023   HGBA1C 8.5 (H) 03/26/2023    Review of Glycemic Control  Latest Reference Range & Units 03/29/23 11:14 03/29/23 15:44 03/29/23 21:16 03/30/23 08:22 03/30/23 12:00  Glucose-Capillary 70 - 99 mg/dL 161 (H) 096 (H) 045 (H) 130 (H) 308 (H)   Diabetes history: DM  Outpatient Diabetes medications:  Metformin 1000 mg bid Actos 15 mg daily Current orders for Inpatient glycemic control:  Novolog 0-9 units tid with meals and HS Solumedrol 40 mg IV daily Inpatient Diabetes Program Recommendations:    If appropriate, consider adding Novolog 2 units tid with meals while on steroids (hold if patient eats less than 50% or NPO).   Thanks,  Lorenza Cambridge, RN, BC-ADM Inpatient Diabetes Coordinator Pager [phone]  (8a-5p)

## 2023-03-30 NOTE — Progress Notes (Signed)
 PROGRESS NOTE   HPI was taken from Dr. Chipper Herb: Derek Lynch is a 83 y.o. male with medical history significant of HTN, HLD, IIDM, chronic ambulatory dysfunction, multiple always, presented with 2 days of cough shortness of breath wheezing malaise weakness and fall.   Patient lives by himself.  And this time he seems to have sore throat, dry cough since yesterday with increasing shortness of breath.  This morning he started to feel very unsteady and lightheaded and fell on right side and hit his right arm.  Denied any head injury, but unsure about whether he lost consciousness or not.  When EMS found him he was found to be hypoxic O2 saturation 77% and very confused, and EMS gave breathing treatment and put him on NRB.   ED Course: Hypoxic O2 saturation 88% on room air and stabilized on 5 L.  Not tachycardia but tachypneic and blood pressure 130/70.  CTA showed no PE but diffuse bilateral bronchial wall thickening and obesity throughout left upper lobe suspicious for aspiration pneumonia.  Cardiomegaly.  Blood work showed creatinine 1.4 compared to baseline 2.9, glucose 242, VBG 7.35/58/31, WBC 8.5, troponin 31> 30.  Lactic acid 3.6   Derek Lynch  ZOX:[id number] DOB: April 22, 1940 DOA: 03/26/2023 PCP: Cheryll Dessert, FNP   Assessment & Plan:   Principal Problem:   Hypoxia Active Problems:   Fall   Influenza A with pneumonia   Acute metabolic encephalopathy   CHF (congestive heart failure) (HCC)  Assessment and Plan: Sepsis: with acute endorgan damage. Met criteria w/ hypoxia, tachypnea, elevated lactic acid & likely secondary to pneumonia. Continue on IV abxs, bronchodilators & encourage incentive spirometry. Weaned off of BiPAP. Sepsis resolved   Influenza pneumonia: continue on tamiflu, steroids. Droplet precautions   Aspiration pneumonia: continue on IV rocephin, flagyl, bronchodilators & encourage incentive spirometry   Acute hypoxic respiratory failure: likely secondary to  pneumonia. CTA chest was neg for PE. Weaned off of BiPAP. Continue on supplemental oxygen and wean as tolerated   Acute encephalopathy: likely secondary to above infections. Mental status has improved, AA&Ox3   Questionable syncope: echo shows EF 55-60%, diastolic function is indeterminate & no regional wall motion abnormalities. PT/OT recs SNF. Pt agrees to SNF    Rhabdomyolysis: CK is still elevated but continues to trend down   Transaminitis: etiology unclear. Still elevated but trending down    Hypokalemia: KCl given    DM2: likely poorly controlled, HbA1c 8.5. Continue on SSI w/ accuchecks    AKI: secondary to sepsis & rhabdomyolysis. 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 Would benefit from weight loss     DVT prophylaxis: lovenox  Code Status: DNR Family Communication: Disposition Plan: likely d/c to SNF  Level of care: Progressive  Status is: Inpatient Remains inpatient appropriate because: severity of illness, requiring IV abxs & needs SNF placement     Consultants:    Procedures:   Antimicrobials: rocephin, flagyl   Subjective: Pt c/o malaise  Objective: Vitals:   03/30/23 0031 03/30/23 0445 03/30/23 0825 03/30/23 0826  BP: 120/76 123/77 114/81 (!) 150/76  Pulse: 66 68 78 67  Resp: 17 17 20    Temp: 97.7 F (36.5 C) 97.7 F (36.5 C) 98.6 F (37 C)   TempSrc: Oral Oral    SpO2: 97% 97% 96%   Weight:      Height:        Intake/Output Summary (  Last 24 hours) at 03/30/2023 0915 Last data filed at 03/30/2023 0826 Gross per 24 hour  Intake 240 ml  Output 1400 ml  Net -1160 ml   Filed Weights   03/26/23 1054  Weight: 88.4 kg    Examination:  General exam: appears comfortable   Respiratory system: course breath sounds b/l  Cardiovascular system: S1/S2+. No rubs or clicks  Gastrointestinal system: abd is soft, NT, obese & hypoactive bowel sounds Central  nervous system: AA&Ox 3. Moves all extremities  Psychiatry: judgement and insight appears at baseline. Flat mood and affect     Data Reviewed: I have personally reviewed following labs and imaging studies  CBC: Recent Labs  Lab 03/26/23 1048 03/27/23 0428 03/28/23 0425 03/29/23 0520 03/30/23 0505  WBC 8.5 6.5 7.1 5.0 5.1  NEUTROABS 6.9  --   --   --   --   HGB 12.6* 11.5* 10.9* 10.9* 11.4*  HCT 37.8* 33.7* 31.7* 31.7* 33.0*  MCV 94.3 91.6 90.8 91.4 89.9  PLT 199 176 178 173 194   Basic Metabolic Panel: Recent Labs  Lab 03/26/23 1048 03/27/23 0428 03/28/23 0425 03/29/23 0520 03/30/23 0505  NA 137 132* 135 139 139  K 4.3 4.2 3.5 3.4* 3.4*  CL 98 98 99 98 100  CO2 24 22 25 28 27   GLUCOSE 242* 268* 150* 142* 146*  BUN 18 19 23  24* 24*  CREATININE 1.43* 1.36* 1.20 1.18 1.07  CALCIUM 8.7* 8.0* 8.1* 8.3* 8.3*   GFR: Estimated Creatinine Clearance: 55.2 mL/min (by C-G formula based on SCr of 1.07 mg/dL). Liver Function Tests: Recent Labs  Lab 03/26/23 1048 03/28/23 0425 03/29/23 0520 03/30/23 0505  AST 99* 124* 83* 55*  ALT 41 51* 48* 45*  ALKPHOS 33* 25* 25* 28*  BILITOT 0.7 0.6 0.8 0.6  PROT 7.9 6.6 6.3* 6.4*  ALBUMIN 3.5 2.7* 2.7* 2.8*   Recent Labs  Lab 03/26/23 1048  LIPASE 24   No results for input(s): "AMMONIA" in the last 168 hours. Coagulation Profile: Recent Labs  Lab 03/26/23 1048  INR 1.1   Cardiac Enzymes: Recent Labs  Lab 03/26/23 1329 03/27/23 0428 03/28/23 0425 03/29/23 0520 03/30/23 0505  CKTOTAL 4,531* 6,211* 4,272* 1,781* 643*   BNP (last 3 results) No results for input(s): "PROBNP" in the last 8760 hours. HbA1C: No results for input(s): "HGBA1C" in the last 72 hours.  CBG: Recent Labs  Lab 03/29/23 0742 03/29/23 1114 03/29/23 1544 03/29/23 2116 03/30/23 0822  GLUCAP 119* 187* 223* 239* 130*   Lipid Profile: No results for input(s): "CHOL", "HDL", "LDLCALC", "TRIG", "CHOLHDL", "LDLDIRECT" in the last 72  hours. Thyroid Function Tests: No results for input(s): "TSH", "T4TOTAL", "FREET4", "T3FREE", "THYROIDAB" in the last 72 hours. Anemia Panel: No results for input(s): "VITAMINB12", "FOLATE", "FERRITIN", "TIBC", "IRON", "RETICCTPCT" in the last 72 hours. Sepsis Labs: Recent Labs  Lab 03/26/23 1048 03/26/23 1329  LATICACIDVEN 3.6* 2.2*    Recent Results (from the past 240 hours)  Blood Culture (routine x 2)     Status: None (Preliminary result)   Collection Time: 03/26/23 10:48 AM   Specimen: Right Antecubital; Blood  Result Value Ref Range Status   Specimen Description RIGHT ANTECUBITAL  Final   Special Requests   Final    Blood Culture results may not be optimal due to an inadequate volume of blood received in culture bottles   Culture   Final    NO GROWTH 4 DAYS Performed at [facility], [address]  [place], [place] [address]    Report Status PENDING  Incomplete  Blood Culture (routine x 2)     Status: None (Preliminary result)   Collection Time: 03/26/23 10:48 AM   Specimen: BLOOD  Result Value Ref Range Status   Specimen Description BLOOD BLOOD RIGHT HAND  Final   Special Requests   Final    Blood Culture results may not be optimal due to an inadequate volume of blood received in culture bottles   Culture   Final    NO GROWTH 4 DAYS Performed at [facility], [address], [place], [place] [address]    Report Status PENDING  Incomplete  Resp panel by RT-PCR (RSV, Flu A&B, Covid) Anterior Nasal Swab     Status: Abnormal   Collection Time: 03/26/23 10:48 AM   Specimen: Anterior Nasal Swab  Result Value Ref Range Status   SARS Coronavirus 2 by RT PCR NEGATIVE NEGATIVE Final    Comment: (NOTE) SARS-CoV-2 target nucleic acids are NOT DETECTED.  The SARS-CoV-2 RNA is generally detectable in upper respiratory specimens during the acute phase of infection. The lowest concentration of SARS-CoV-2 viral copies this assay can detect is 138 copies/mL.  A negative result does not preclude SARS-Cov-2 infection and should not be used as the sole basis for treatment or other patient management decisions. A negative result may occur with  improper specimen collection/handling, submission of specimen other than nasopharyngeal swab, presence of viral mutation(s) within the areas targeted by this assay, and inadequate number of viral copies(<138 copies/mL). A negative result must be combined with clinical observations, patient history, and epidemiological information. The expected result is Negative.  Fact Sheet for Patients:  BloggerCourse.com  Fact Sheet for Healthcare Providers:  SeriousBroker.it  This test is no t yet approved or cleared by the [place] FDA and  has been authorized for detection and/or diagnosis of SARS-CoV-2 by FDA under an Emergency Use Authorization (EUA). This EUA will remain  in effect (meaning this test can be used) for the duration of the COVID-19 declaration under Section 564(b)(1) of the Act, 21 U.S.C.section 360bbb-3(b)(1), unless the authorization is terminated  or revoked sooner.       Influenza A by PCR POSITIVE (A) NEGATIVE Final   Influenza B by PCR NEGATIVE NEGATIVE Final    Comment: (NOTE) The Xpert Xpress SARS-CoV-2/FLU/RSV plus assay is intended as an aid in the diagnosis of influenza from Nasopharyngeal swab specimens and should not be used as a sole basis for treatment. Nasal washings and aspirates are unacceptable for Xpert Xpress SARS-CoV-2/FLU/RSV testing.  Fact Sheet for Patients: BloggerCourse.com  Fact Sheet for Healthcare Providers: SeriousBroker.it  This test is not yet approved or cleared by the [place] FDA and has been authorized for detection and/or diagnosis of SARS-CoV-2 by FDA under an Emergency Use Authorization (EUA). This EUA will remain in effect (meaning this  test can be used) for the duration of the COVID-19 declaration under Section 564(b)(1) of the Act, 21 U.S.C. section 360bbb-3(b)(1), unless the authorization is terminated or revoked.     Resp Syncytial Virus by PCR NEGATIVE NEGATIVE Final    Comment: (NOTE) Fact Sheet for Patients: BloggerCourse.com  Fact Sheet for Healthcare Providers: SeriousBroker.it  This test is not yet approved or cleared by the [place] FDA and has been authorized for detection and/or diagnosis of SARS-CoV-2 by FDA under an Emergency Use Authorization (EUA). This EUA will remain in effect (meaning this test can be used) for the duration of the  COVID-19 declaration under Section 564(b)(1) of the Act, 21 U.S.C. section 360bbb-3(b)(1), unless the authorization is terminated or revoked.  Performed at [facility], [address] [place], [place] [address]   MRSA Next Gen by PCR, Nasal     Status: None   Collection Time: 03/26/23  3:49 PM   Specimen: Nasal Mucosa; Nasal Swab  Result Value Ref Range Status   MRSA by PCR Next Gen NOT DETECTED NOT DETECTED Final    Comment: (NOTE) The GeneXpert MRSA Assay (FDA approved for NASAL specimens only), is one component of a comprehensive MRSA colonization surveillance program. It is not intended to diagnose MRSA infection nor to guide or monitor treatment for MRSA infections. Test performance is not FDA approved in patients less than 31 years old. Performed at [facility], [address] [place], [place] [address]          Radiology Studies: No results found.       Scheduled Meds:  aspirin EC  81 mg Oral Daily   enoxaparin (LOVENOX) injection  0.5 mg/kg Subcutaneous Q24H   furosemide  40 mg Intravenous Daily   hydrocortisone   Topical BID   insulin aspart  0-5 Units Subcutaneous QHS   insulin aspart  0-9 Units Subcutaneous TID WC   lisinopril  2.5 mg Oral Daily    methylPREDNISolone (SOLU-MEDROL) injection  40 mg Intravenous Daily   oseltamivir  30 mg Oral BID   Continuous Infusions:  cefTRIAXone (ROCEPHIN)  IV 1 g (03/29/23 1802)   metronidazole 500 mg (03/29/23 2122)     LOS: 4 days       Charise Killian, MD [facility] Pager [phone]  If 7PM-7AM, please contact night-coverage [email or web address] 03/30/2023, 9:15 AM

## 2023-03-30 NOTE — TOC Progression Note (Addendum)
 Transition of Care [facility]) - Progression Note    Patient Details  Name: Derek Lynch MRN: [id number] Date of Birth: Mar 18, 1940  Transition of Care [facility]) CM/SW Contact  Truddie Hidden, RN Phone Number: 03/30/2023, 12:18 PM  Clinical Narrative:    Attempt to reach patient's friend, Derek Lynch to discuss discharge planning. Left a message.  12:48pm Spoke with patient's friend Derek Lynch. She stated patient is life long friend and  does not have family that is willing to assist with decision making. She has assisted patient with his affairs. She will talk with patient about SNF vs HH. Derek Lynch stated patient success at SNF on last admissions was slow. She and patient will make a decision by tomorrow.          Expected Discharge Plan and Services                                               Social Determinants of Health (SDOH) Interventions SDOH Screenings   Food Insecurity: No Food Insecurity (03/26/2023)  Housing: Low Risk  (03/26/2023)  Transportation Needs: No Transportation Needs (03/26/2023)  Utilities: Not At Risk (03/26/2023)  Financial Resource Strain: Low Risk  (10/29/2022)   Received from [facility]  Social Connections: Unknown (03/26/2023)  Tobacco Use: Medium Risk (03/26/2023)    Readmission Risk Interventions     No data to display

## 2023-03-31 DIAGNOSIS — G9341 Metabolic encephalopathy: Secondary | ICD-10-CM

## 2023-03-31 DIAGNOSIS — R0902 Hypoxemia: Secondary | ICD-10-CM | POA: Diagnosis not present

## 2023-03-31 LAB — COMPREHENSIVE METABOLIC PANEL
ALT: 45 U/L — ABNORMAL HIGH (ref 0–44)
AST: 47 U/L — ABNORMAL HIGH (ref 15–41)
Albumin: 2.9 g/dL — ABNORMAL LOW (ref 3.5–5.0)
Alkaline Phosphatase: 33 U/L — ABNORMAL LOW (ref 38–126)
Anion gap: 10 (ref 5–15)
BUN: 21 mg/dL (ref 8–23)
CO2: 28 mmol/L (ref 22–32)
Calcium: 8.6 mg/dL — ABNORMAL LOW (ref 8.9–10.3)
Chloride: 102 mmol/L (ref 98–111)
Creatinine, Ser: 1.03 mg/dL (ref 0.61–1.24)
GFR, Estimated: >60 mL/min (ref >60)
Glucose, Bld: 148 mg/dL — ABNORMAL HIGH (ref 70–99)
Potassium: 3.7 mmol/L (ref 3.5–5.1)
Sodium: 140 mmol/L (ref 135–145)
Total Bilirubin: 0.6 mg/dL (ref 0.0–1.2)
Total Protein: 7.1 g/dL (ref 6.5–8.1)

## 2023-03-31 LAB — GLUCOSE, CAPILLARY
Glucose-Capillary: 156 mg/dL — ABNORMAL HIGH (ref 70–99)
Glucose-Capillary: 253 mg/dL — ABNORMAL HIGH (ref 70–99)
Glucose-Capillary: 252 mg/dL — ABNORMAL HIGH (ref 70–99)
Glucose-Capillary: 179 mg/dL — ABNORMAL HIGH (ref 70–99)

## 2023-03-31 LAB — CBC
HCT: 36.1 % — ABNORMAL LOW (ref 39.0–52.0)
Hemoglobin: 12.3 g/dL — ABNORMAL LOW (ref 13.0–17.0)
MCH: 31 pg (ref 26.0–34.0)
MCHC: 34.1 g/dL (ref 30.0–36.0)
MCV: 90.9 fL (ref 80.0–100.0)
Platelets: 231 10*3/uL (ref 150–400)
RBC: 3.97 MIL/uL — ABNORMAL LOW (ref 4.22–5.81)
RDW: 13.1 % (ref 11.5–15.5)
WBC: 7.4 10*3/uL (ref 4.0–10.5)
nRBC: 0.3 % — ABNORMAL HIGH (ref 0.0–0.2)

## 2023-03-31 LAB — CULTURE, BLOOD (ROUTINE X 2)
Culture: NO GROWTH
Culture: NO GROWTH

## 2023-03-31 LAB — CK: Total CK: 283 U/L (ref 49–397)

## 2023-03-31 MED ORDER — INSULIN ASPART 100 UNIT/ML IJ SOLN
2.0000 [IU] | Freq: Three times a day (TID) | INTRAMUSCULAR | Status: DC
  Administered 2023-03-31 – 2023-04-05 (×16): 2 [IU] via SUBCUTANEOUS
  Filled 2023-03-31 (×16): qty 1

## 2023-03-31 NOTE — Plan of Care (Signed)
  Problem: Coping: Goal: Ability to adjust to condition or change in health will improve Outcome: Progressing   Problem: Activity: Goal: Risk for activity intolerance will decrease Outcome: Progressing   Problem: Clinical Measurements: Goal: Respiratory complications will improve Outcome: Progressing   Problem: Clinical Measurements: Goal: Cardiovascular complication will be avoided Outcome: Progressing   Problem: Safety: Goal: Ability to remain free from injury will improve Outcome: Progressing

## 2023-03-31 NOTE — Plan of Care (Signed)

## 2023-03-31 NOTE — Progress Notes (Signed)
 Progress Note    Derek Lynch  WGN:[id number] DOB: 1940-12-03  DOA: 03/26/2023 PCP: Cheryll Dessert, FNP      Brief Narrative:    Medical records reviewed and are as summarized below:  Derek Lynch is a 83 y.o. male  with medical history significant of HTN, HLD, IIDM, chronic ambulatory dysfunction, arthritis, who presented to the hospital with cough, shortness of breath, wheezing, malaise, general weakness and fall.  He felt very weak, unsteady and lightheaded and fell on his right arm.  He lives alone at home.  He was hypoxic with oxygen saturation of 77% on room air and confused when EMS arrived.  He was placed on oxygen via nonrebreather mask.        Assessment/Plan:   Principal Problem:   Hypoxia Active Problems:   Fall   Influenza A with pneumonia   Acute metabolic encephalopathy   CHF (congestive heart failure) (HCC)    Body mass index is 33.45 kg/m.  (Obesity)    Severe sepsis secondary to pneumonia influenza pneumonia and probable aspiration pneumonia: Continue IV ceftriaxone, IV Flagyl and Tamiflu.  Plan to complete 7 days of ceftriaxone and Flagyl on 04/01/2023.  Plan to complete Tamiflu today. Continue droplet precautions   Acute hypoxic respiratory failure: He is on 4 L/min oxygen via [place].  Wean off oxygen as able. Previously treated with BiPAP.    Acute metabolic encephalopathy: Mental status has improved     Questionable syncope: echo shows EF 55-60%, diastolic function is indeterminate & no regional wall motion abnormalities. PT/OT recs SNF. Pt agrees to SNF     Rhabdomyolysis: Improved.  CK down to 283. CK was 6,211 on admission.    Elevated liver enzymes: Improved.  Probably from sepsis and rhabdomyolysis.     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  Continue NovoLog correction scale as needed.    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 Yes; Fluid consistency: Thin  Diet effective now                            Consultants: None  Procedures: None    Medications:    aspirin EC  81 mg Oral Daily   enoxaparin (LOVENOX) injection  0.5 mg/kg Subcutaneous Q24H   furosemide  40 mg Intravenous Daily   hydrocortisone   Topical BID   insulin aspart  0-5 Units Subcutaneous QHS   insulin aspart  0-9 Units Subcutaneous TID WC   insulin aspart  2 Units Subcutaneous TID WC   lisinopril  2.5 mg Oral Daily   methylPREDNISolone (SOLU-MEDROL) injection  40 mg Intravenous Daily   oseltamivir  30 mg Oral BID   Continuous Infusions:  cefTRIAXone (ROCEPHIN)  IV 1 g (03/30/23 1754)   metronidazole 500 mg (03/31/23 0857)     Anti-infectives (From admission, onward)    Start     Dose/Rate Route Frequency Ordered Stop   03/27/23 1000  oseltamivir (TAMIFLU) capsule 30 mg        30 mg Oral 2 times daily 03/26/23 1719 04/01/23 0959   03/26/23 2200  metroNIDAZOLE (FLAGYL) IVPB 500 mg        500 mg  100 mL/hr over 60 Minutes Intravenous Every 12 hours 03/26/23 1807     03/26/23 1806  cefTRIAXone (ROCEPHIN) 1 g in sodium chloride 0.9 % 100 mL IVPB        1 g 200 mL/hr over 30 Minutes Intravenous Every 24 hours 03/26/23 1807     03/26/23 1430  vancomycin (VANCOCIN) IVPB 1000 mg/200 mL premix       Placed in "Followed by" Linked Group   1,000 mg 200 mL/hr over 60 Minutes Intravenous  Once 03/26/23 1319 03/26/23 2021   03/26/23 1330  vancomycin (VANCOCIN) IVPB 1000 mg/200 mL premix       Placed in "Followed by" Linked Group   1,000 mg 200 mL/hr over 60 Minutes Intravenous  Once 03/26/23 1319 03/26/23 1659   03/26/23 1315  oseltamivir (TAMIFLU) capsule 75 mg        75 mg Oral  Once 03/26/23 1259 03/26/23 1557   03/26/23 1200  ceFEPIme (MAXIPIME) 2 g in sodium chloride 0.9 % 100 mL IVPB        2 g 200  mL/hr over 30 Minutes Intravenous  Once 03/26/23 1147 03/26/23 1321   03/26/23 1200  metroNIDAZOLE (FLAGYL) IVPB 500 mg        500 mg 100 mL/hr over 60 Minutes Intravenous  Once 03/26/23 1147 03/26/23 1525   03/26/23 1200  vancomycin (VANCOCIN) IVPB 1000 mg/200 mL premix  Status:  Discontinued        1,000 mg 200 mL/hr over 60 Minutes Intravenous  Once 03/26/23 1147 03/26/23 1319              Family Communication/Anticipated D/C date and plan/Code Status   DVT prophylaxis:      Code Status: Limited: Do not attempt resuscitation (DNR) -DNR-LIMITED -Do Not Intubate/DNI   Family Communication: None Disposition Plan: Plan to discharge to SNF   Status is: Inpatient Remains inpatient appropriate because: Sepsis from pneumonia and acute hypoxic respiratory failure       Subjective:   Interval events noted.  He complains of cough and general weakness.  Breathing is a little better.  Objective:    Vitals:   03/30/23 2057 03/31/23 0052 03/31/23 0450 03/31/23 0756  BP: (!) 127/108 (!) 147/83 (!) 155/88 138/74  Pulse: 75 70 84 76  Resp: 18 18 19    Temp: 97.8 F (36.6 C) 97.6 F (36.4 C) 98.5 F (36.9 C) 98.3 F (36.8 C)  TempSrc: Oral Oral Oral   SpO2: 96% 96% 97% 93%  Weight:      Height:       No data found.   Intake/Output Summary (Last 24 hours) at 03/31/2023 1131 Last data filed at 03/30/2023 2350 Gross per 24 hour  Intake --  Output 1300 ml  Net -1300 ml   Filed Weights   03/26/23 1054  Weight: 88.4 kg    Exam:  GEN: NAD SKIN: Warm and dry EYES: No pallor or icterus ENT: MMM CV: RRR PULM: Bibasilar rales, bilateral rhonchi ABD: soft, ND, NT, +BS CNS: AAO x 2 (person and place), non focal EXT: No edema or tenderness        Data Reviewed:   I have personally reviewed following labs and imaging studies:  Labs: Labs show the following:   Basic Metabolic Panel: Recent Labs  Lab 03/27/23 0428 03/28/23 0425 03/29/23 0520  03/30/23 0505 03/31/23 0631  NA 132* 135 139 139 140  K 4.2 3.5 3.4* 3.4* 3.7  CL 98 99 98 100 102  CO2 22 25 28 27 28   GLUCOSE 268* 150* 142* 146* 148*  BUN 19 23 24* 24* 21  CREATININE 1.36* 1.20 1.18 1.07 1.03  CALCIUM 8.0* 8.1* 8.3* 8.3* 8.6*   GFR Estimated Creatinine Clearance: 57.4 mL/min (by C-G formula based on SCr of 1.03 mg/dL). Liver Function Tests: Recent Labs  Lab 03/26/23 1048 03/28/23 0425 03/29/23 0520 03/30/23 0505 03/31/23 0631  AST 99* 124* 83* 55* 47*  ALT 41 51* 48* 45* 45*  ALKPHOS 33* 25* 25* 28* 33*  BILITOT 0.7 0.6 0.8 0.6 0.6  PROT 7.9 6.6 6.3* 6.4* 7.1  ALBUMIN 3.5 2.7* 2.7* 2.8* 2.9*   Recent Labs  Lab 03/26/23 1048  LIPASE 24   No results for input(s): "AMMONIA" in the last 168 hours. Coagulation profile Recent Labs  Lab 03/26/23 1048  INR 1.1    CBC: Recent Labs  Lab 03/26/23 1048 03/27/23 0428 03/28/23 0425 03/29/23 0520 03/30/23 0505 03/31/23 0631  WBC 8.5 6.5 7.1 5.0 5.1 7.4  NEUTROABS 6.9  --   --   --   --   --   HGB 12.6* 11.5* 10.9* 10.9* 11.4* 12.3*  HCT 37.8* 33.7* 31.7* 31.7* 33.0* 36.1*  MCV 94.3 91.6 90.8 91.4 89.9 90.9  PLT 199 176 178 173 194 231   Cardiac Enzymes: Recent Labs  Lab 03/27/23 0428 03/28/23 0425 03/29/23 0520 03/30/23 0505 03/31/23 0631  CKTOTAL 6,211* 4,272* 1,781* 643* 283   BNP (last 3 results) No results for input(s): "PROBNP" in the last 8760 hours. CBG: Recent Labs  Lab 03/30/23 1200 03/30/23 1625 03/30/23 2042 03/30/23 2318 03/31/23 0852  GLUCAP 308* 240* 261* 207* 156*   D-Dimer: No results for input(s): "DDIMER" in the last 72 hours. Hgb A1c: No results for input(s): "HGBA1C" in the last 72 hours. Lipid Profile: No results for input(s): "CHOL", "HDL", "LDLCALC", "TRIG", "CHOLHDL", "LDLDIRECT" in the last 72 hours. Thyroid function studies: No results for input(s): "TSH", "T4TOTAL", "T3FREE", "THYROIDAB" in the last 72 hours.  Invalid input(s): "FREET3" Anemia  work up: No results for input(s): "VITAMINB12", "FOLATE", "FERRITIN", "TIBC", "IRON", "RETICCTPCT" in the last 72 hours. Sepsis Labs: Recent Labs  Lab 03/26/23 1048 03/26/23 1329 03/27/23 0428 03/28/23 0425 03/29/23 0520 03/30/23 0505 03/31/23 0631  WBC 8.5  --    < > 7.1 5.0 5.1 7.4  LATICACIDVEN 3.6* 2.2*  --   --   --   --   --    < > = values in this interval not displayed.    Microbiology Recent Results (from the past 240 hours)  Blood Culture (routine x 2)     Status: None   Collection Time: 03/26/23 10:48 AM   Specimen: Right Antecubital; Blood  Result Value Ref Range Status   Specimen Description RIGHT ANTECUBITAL  Final   Special Requests   Final    Blood Culture results may not be optimal due to an inadequate volume of blood received in culture bottles   Culture   Final    NO GROWTH 5 DAYS Performed at [facility], [address], [place], [place] [address]    Report Status 03/31/2023 FINAL  Final  Blood Culture (routine x 2)     Status: None   Collection Time: 03/26/23 10:48 AM   Specimen: BLOOD  Result Value Ref Range Status   Specimen Description BLOOD BLOOD RIGHT HAND  Final   Special Requests   Final    Blood Culture results may not be optimal due to  an inadequate volume of blood received in culture bottles   Culture   Final    NO GROWTH 5 DAYS Performed at [facility], [address] [place], [place] [address]    Report Status 03/31/2023 FINAL  Final  Resp panel by RT-PCR (RSV, Flu A&B, Covid) Anterior Nasal Swab     Status: Abnormal   Collection Time: 03/26/23 10:48 AM   Specimen: Anterior Nasal Swab  Result Value Ref Range Status   SARS Coronavirus 2 by RT PCR NEGATIVE NEGATIVE Final    Comment: (NOTE) SARS-CoV-2 target nucleic acids are NOT DETECTED.  The SARS-CoV-2 RNA is generally detectable in upper respiratory specimens during the acute phase of infection. The lowest concentration of SARS-CoV-2 viral copies this assay  can detect is 138 copies/mL. A negative result does not preclude SARS-Cov-2 infection and should not be used as the sole basis for treatment or other patient management decisions. A negative result may occur with  improper specimen collection/handling, submission of specimen other than nasopharyngeal swab, presence of viral mutation(s) within the areas targeted by this assay, and inadequate number of viral copies(<138 copies/mL). A negative result must be combined with clinical observations, patient history, and epidemiological information. The expected result is Negative.  Fact Sheet for Patients:  BloggerCourse.com  Fact Sheet for Healthcare Providers:  SeriousBroker.it  This test is no t yet approved or cleared by the [place] FDA and  has been authorized for detection and/or diagnosis of SARS-CoV-2 by FDA under an Emergency Use Authorization (EUA). This EUA will remain  in effect (meaning this test can be used) for the duration of the COVID-19 declaration under Section 564(b)(1) of the Act, 21 U.S.C.section 360bbb-3(b)(1), unless the authorization is terminated  or revoked sooner.       Influenza A by PCR POSITIVE (A) NEGATIVE Final   Influenza B by PCR NEGATIVE NEGATIVE Final    Comment: (NOTE) The Xpert Xpress SARS-CoV-2/FLU/RSV plus assay is intended as an aid in the diagnosis of influenza from Nasopharyngeal swab specimens and should not be used as a sole basis for treatment. Nasal washings and aspirates are unacceptable for Xpert Xpress SARS-CoV-2/FLU/RSV testing.  Fact Sheet for Patients: BloggerCourse.com  Fact Sheet for Healthcare Providers: SeriousBroker.it  This test is not yet approved or cleared by the [place] FDA and has been authorized for detection and/or diagnosis of SARS-CoV-2 by FDA under an Emergency Use Authorization (EUA). This EUA will  remain in effect (meaning this test can be used) for the duration of the COVID-19 declaration under Section 564(b)(1) of the Act, 21 U.S.C. section 360bbb-3(b)(1), unless the authorization is terminated or revoked.     Resp Syncytial Virus by PCR NEGATIVE NEGATIVE Final    Comment: (NOTE) Fact Sheet for Patients: BloggerCourse.com  Fact Sheet for Healthcare Providers: SeriousBroker.it  This test is not yet approved or cleared by the [place] FDA and has been authorized for detection and/or diagnosis of SARS-CoV-2 by FDA under an Emergency Use Authorization (EUA). This EUA will remain in effect (meaning this test can be used) for the duration of the COVID-19 declaration under Section 564(b)(1) of the Act, 21 U.S.C. section 360bbb-3(b)(1), unless the authorization is terminated or revoked.  Performed at [facility], [address] [place], [place] [address]   MRSA Next Gen by PCR, Nasal     Status: None   Collection Time: 03/26/23  3:49 PM   Specimen: Nasal Mucosa; Nasal Swab  Result Value Ref Range Status   MRSA  by PCR Next Gen NOT DETECTED NOT DETECTED Final    Comment: (NOTE) The GeneXpert MRSA Assay (FDA approved for NASAL specimens only), is one component of a comprehensive MRSA colonization surveillance program. It is not intended to diagnose MRSA infection nor to guide or monitor treatment for MRSA infections. Test performance is not FDA approved in patients less than 19 years old. Performed at [facility], [address] [place], [place] [address]     Procedures and diagnostic studies:  No results found.             LOS: 5 days   Kamar Callender  [facility]   Pager on [email or web address]. If 7PM-7AM, please contact night-coverage at [email or web address]     03/31/2023, 11:31 AM

## 2023-03-31 NOTE — TOC Progression Note (Signed)
 Transition of Care [facility]) - Progression Note    Patient Details  Name: Derek Lynch MRN: [id number] Date of Birth: 30-Dec-1940  [facility]) CM/SW Contact  Truddie Hidden, RN Phone Number: 03/31/2023, 3:04 PM  Clinical Narrative:    Spoke with patient's friend [name]. She and patient have decided on SNF at [facility]. [name] was advised Berkley Harvey would be required for SNF.          Expected Discharge Plan and Services                                               Social Determinants of Health (SDOH) Interventions SDOH Screenings   Food Insecurity: No Food Insecurity (03/26/2023)  Housing: Low Risk  (03/26/2023)  Transportation Needs: No Transportation Needs (03/26/2023)  Utilities: Not At Risk (03/26/2023)  Financial Resource Strain: Low Risk  (10/29/2022)   Received from [facility]  Social Connections: Unknown (03/26/2023)  Tobacco Use: Medium Risk (03/26/2023)    Readmission Risk Interventions     No data to display

## 2023-04-01 DIAGNOSIS — R0902 Hypoxemia: Secondary | ICD-10-CM | POA: Diagnosis not present

## 2023-04-01 LAB — GLUCOSE, CAPILLARY
Glucose-Capillary: 165 mg/dL — ABNORMAL HIGH (ref 70–99)
Glucose-Capillary: 220 mg/dL — ABNORMAL HIGH (ref 70–99)
Glucose-Capillary: 221 mg/dL — ABNORMAL HIGH (ref 70–99)
Glucose-Capillary: 183 mg/dL — ABNORMAL HIGH (ref 70–99)

## 2023-04-01 MED ORDER — PREDNISONE 20 MG PO TABS
40.0000 mg | ORAL_TABLET | Freq: Every day | ORAL | Status: AC
  Administered 2023-04-01 – 2023-04-02 (×2): 40 mg via ORAL
  Filled 2023-04-01 (×2): qty 2

## 2023-04-01 NOTE — Progress Notes (Signed)
 Progress Note    Derek Lynch  ZOX:[id number] DOB: 09/01/1940  DOA: 03/26/2023 PCP: Cheryll Dessert, FNP      Brief Narrative:    Medical records reviewed and are as summarized below:  Derek Lynch is a 83 y.o. male  with medical history significant of HTN, HLD, IIDM, chronic ambulatory dysfunction, arthritis, who presented to the hospital with cough, shortness of breath, wheezing, malaise, general weakness and fall.  He felt very weak, unsteady and lightheaded and fell on his right arm.  He lives alone at home.  He was hypoxic with oxygen saturation of 77% on room air and confused when EMS arrived.  He was placed on oxygen via nonrebreather mask.        Assessment/Plan:   Principal Problem:   Hypoxia Active Problems:   Fall   Influenza A with pneumonia   Acute metabolic encephalopathy   CHF (congestive heart failure) (HCC)    Body mass index is 29.67 kg/m.  (Obesity)    Severe sepsis secondary to pneumonia influenza pneumonia and probable aspiration pneumonia: Plan to complete 7 days of IV ceftriaxone and Flagyl today.  Completed 5 days of Tamiflu on 03/31/2023. Continue droplet precautions   Acute hypoxic respiratory failure: Continue 4 L/min oxygen and wean off as able. Previously treated with BiPAP.    Acute metabolic encephalopathy: Mental status has improved though he still has some confusion.  Baseline mental status is unclear.   Questionable syncope: echo shows EF 55-60%, diastolic function is indeterminate & no regional wall motion abnormalities. PT/OT recs SNF. Pt agrees to SNF     Rhabdomyolysis: Improved.  CK down to 283. CK was 6,211 on admission.    Elevated liver enzymes: Improved.  Probably from sepsis and rhabdomyolysis.     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  Continue NovoLog correction scale as needed.    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 Yes; Fluid consistency: Thin  Diet effective now                            Consultants: None  Procedures: None    Medications:    aspirin EC  81 mg Oral Daily   enoxaparin (LOVENOX) injection  0.5 mg/kg Subcutaneous Q24H   furosemide  40 mg Intravenous Daily   hydrocortisone   Topical BID   insulin aspart  0-5 Units Subcutaneous QHS   insulin aspart  0-9 Units Subcutaneous TID WC   insulin aspart  2 Units Subcutaneous TID WC   lisinopril  2.5 mg Oral Daily   predniSONE  40 mg Oral Q breakfast   Continuous Infusions:  cefTRIAXone (ROCEPHIN)  IV 1 g (03/31/23 1824)   metronidazole 500 mg (04/01/23 1014)     Anti-infectives (From admission, onward)    Start     Dose/Rate Route Frequency Ordered Stop   03/27/23 1000  oseltamivir (TAMIFLU) capsule 30 mg        30 mg Oral 2 times daily 03/26/23 1719 03/31/23 2237   03/26/23 2200  metroNIDAZOLE (FLAGYL) IVPB 500 mg        500 mg 100 mL/hr over 60 Minutes Intravenous Every 12 hours 03/26/23  1807 04/01/23 2300   03/26/23 1806  cefTRIAXone (ROCEPHIN) 1 g in sodium chloride 0.9 % 100 mL IVPB        1 g 200 mL/hr over 30 Minutes Intravenous Every 24 hours 03/26/23 1807 04/01/23 2300   03/26/23 1430  vancomycin (VANCOCIN) IVPB 1000 mg/200 mL premix       Placed in "Followed by" Linked Group   1,000 mg 200 mL/hr over 60 Minutes Intravenous  Once 03/26/23 1319 03/26/23 2021   03/26/23 1330  vancomycin (VANCOCIN) IVPB 1000 mg/200 mL premix       Placed in "Followed by" Linked Group   1,000 mg 200 mL/hr over 60 Minutes Intravenous  Once 03/26/23 1319 03/26/23 1659   03/26/23 1315  oseltamivir (TAMIFLU) capsule 75 mg        75 mg Oral  Once 03/26/23 1259 03/26/23 1557   03/26/23 1200  ceFEPIme (MAXIPIME) 2 g in sodium chloride 0.9 % 100 mL IVPB        2 g 200 mL/hr over 30 Minutes  Intravenous  Once 03/26/23 1147 03/26/23 1321   03/26/23 1200  metroNIDAZOLE (FLAGYL) IVPB 500 mg        500 mg 100 mL/hr over 60 Minutes Intravenous  Once 03/26/23 1147 03/26/23 1525   03/26/23 1200  vancomycin (VANCOCIN) IVPB 1000 mg/200 mL premix  Status:  Discontinued        1,000 mg 200 mL/hr over 60 Minutes Intravenous  Once 03/26/23 1147 03/26/23 1319              Family Communication/Anticipated D/C date and plan/Code Status   DVT prophylaxis:      Code Status: Limited: Do not attempt resuscitation (DNR) -DNR-LIMITED -Do Not Intubate/DNI   Family Communication: None Disposition Plan: Plan to discharge to SNF   Status is: Inpatient Remains inpatient appropriate because: Sepsis from pneumonia and acute hypoxic respiratory failure       Subjective:   Interval events noted.  He has no complaints.  However, patient was coughing frequently during this encounter.  Objective:    Vitals:   04/01/23 0422 04/01/23 0500 04/01/23 0910 04/01/23 1242  BP: (!) 143/95  (!) 151/92 122/78  Pulse: 86  85 74  Resp: 18     Temp: 98 F (36.7 C)  (!) 97.5 F (36.4 C) 97.7 F (36.5 C)  TempSrc:    Oral  SpO2: 98%  98% 98%  Weight:  78.4 kg    Height:       No data found.   Intake/Output Summary (Last 24 hours) at 04/01/2023 1254 Last data filed at 04/01/2023 1134 Gross per 24 hour  Intake 120 ml  Output 1500 ml  Net -1380 ml   Filed Weights   03/26/23 1054 04/01/23 0500  Weight: 88.4 kg 78.4 kg    Exam:  GEN: NAD SKIN: Warm and dry EYES: No pallor or icterus ENT: MMM CV: RRR PULM: Bibasilar rales, bilateral rhonchi ABD: soft, ND, NT, +BS CNS: AAO x 2 (person and place), non focal EXT: No edema or tenderness       Data Reviewed:   I have personally reviewed following labs and imaging studies:  Labs: Labs show the following:   Basic Metabolic Panel: Recent Labs  Lab 03/27/23 0428 03/28/23 0425 03/29/23 0520 03/30/23 0505 03/31/23 0631   NA 132* 135 139 139 140  K 4.2 3.5 3.4* 3.4* 3.7  CL 98 99 98 100 102  CO2 22 25 28 27 28   GLUCOSE  268* 150* 142* 146* 148*  BUN 19 23 24* 24* 21  CREATININE 1.36* 1.20 1.18 1.07 1.03  CALCIUM 8.0* 8.1* 8.3* 8.3* 8.6*   GFR Estimated Creatinine Clearance: 54.1 mL/min (by C-G formula based on SCr of 1.03 mg/dL). Liver Function Tests: Recent Labs  Lab 03/26/23 1048 03/28/23 0425 03/29/23 0520 03/30/23 0505 03/31/23 0631  AST 99* 124* 83* 55* 47*  ALT 41 51* 48* 45* 45*  ALKPHOS 33* 25* 25* 28* 33*  BILITOT 0.7 0.6 0.8 0.6 0.6  PROT 7.9 6.6 6.3* 6.4* 7.1  ALBUMIN 3.5 2.7* 2.7* 2.8* 2.9*   Recent Labs  Lab 03/26/23 1048  LIPASE 24   No results for input(s): "AMMONIA" in the last 168 hours. Coagulation profile Recent Labs  Lab 03/26/23 1048  INR 1.1    CBC: Recent Labs  Lab 03/26/23 1048 03/27/23 0428 03/28/23 0425 03/29/23 0520 03/30/23 0505 03/31/23 0631  WBC 8.5 6.5 7.1 5.0 5.1 7.4  NEUTROABS 6.9  --   --   --   --   --   HGB 12.6* 11.5* 10.9* 10.9* 11.4* 12.3*  HCT 37.8* 33.7* 31.7* 31.7* 33.0* 36.1*  MCV 94.3 91.6 90.8 91.4 89.9 90.9  PLT 199 176 178 173 194 231   Cardiac Enzymes: Recent Labs  Lab 03/27/23 0428 03/28/23 0425 03/29/23 0520 03/30/23 0505 03/31/23 0631  CKTOTAL 6,211* 4,272* 1,781* 643* 283   BNP (last 3 results) No results for input(s): "PROBNP" in the last 8760 hours. CBG: Recent Labs  Lab 03/31/23 1227 03/31/23 1607 03/31/23 2129 04/01/23 0907 04/01/23 1240  GLUCAP 253* 252* 179* 165* 220*   D-Dimer: No results for input(s): "DDIMER" in the last 72 hours. Hgb A1c: No results for input(s): "HGBA1C" in the last 72 hours. Lipid Profile: No results for input(s): "CHOL", "HDL", "LDLCALC", "TRIG", "CHOLHDL", "LDLDIRECT" in the last 72 hours. Thyroid function studies: No results for input(s): "TSH", "T4TOTAL", "T3FREE", "THYROIDAB" in the last 72 hours.  Invalid input(s): "FREET3" Anemia work up: No results for  input(s): "VITAMINB12", "FOLATE", "FERRITIN", "TIBC", "IRON", "RETICCTPCT" in the last 72 hours. Sepsis Labs: Recent Labs  Lab 03/26/23 1048 03/26/23 1329 03/27/23 0428 03/28/23 0425 03/29/23 0520 03/30/23 0505 03/31/23 0631  WBC 8.5  --    < > 7.1 5.0 5.1 7.4  LATICACIDVEN 3.6* 2.2*  --   --   --   --   --    < > = values in this interval not displayed.    Microbiology Recent Results (from the past 240 hours)  Blood Culture (routine x 2)     Status: None   Collection Time: 03/26/23 10:48 AM   Specimen: Right Antecubital; Blood  Result Value Ref Range Status   Specimen Description RIGHT ANTECUBITAL  Final   Special Requests   Final    Blood Culture results may not be optimal due to an inadequate volume of blood received in culture bottles   Culture   Final    NO GROWTH 5 DAYS Performed at [facility], [address], [place], [place] [address]    Report Status 03/31/2023 FINAL  Final  Blood Culture (routine x 2)     Status: None   Collection Time: 03/26/23 10:48 AM   Specimen: BLOOD  Result Value Ref Range Status   Specimen Description BLOOD BLOOD RIGHT HAND  Final   Special Requests   Final    Blood Culture results may not be optimal due to an inadequate volume of blood received in culture  bottles   Culture   Final    NO GROWTH 5 DAYS Performed at [facility], [address] [place], [place] [address]    Report Status 03/31/2023 FINAL  Final  Resp panel by RT-PCR (RSV, Flu A&B, Covid) Anterior Nasal Swab     Status: Abnormal   Collection Time: 03/26/23 10:48 AM   Specimen: Anterior Nasal Swab  Result Value Ref Range Status   SARS Coronavirus 2 by RT PCR NEGATIVE NEGATIVE Final    Comment: (NOTE) SARS-CoV-2 target nucleic acids are NOT DETECTED.  The SARS-CoV-2 RNA is generally detectable in upper respiratory specimens during the acute phase of infection. The lowest concentration of SARS-CoV-2 viral copies this assay can detect is 138  copies/mL. A negative result does not preclude SARS-Cov-2 infection and should not be used as the sole basis for treatment or other patient management decisions. A negative result may occur with  improper specimen collection/handling, submission of specimen other than nasopharyngeal swab, presence of viral mutation(s) within the areas targeted by this assay, and inadequate number of viral copies(<138 copies/mL). A negative result must be combined with clinical observations, patient history, and epidemiological information. The expected result is Negative.  Fact Sheet for Patients:  BloggerCourse.com  Fact Sheet for Healthcare Providers:  SeriousBroker.it  This test is no t yet approved or cleared by the [place] FDA and  has been authorized for detection and/or diagnosis of SARS-CoV-2 by FDA under an Emergency Use Authorization (EUA). This EUA will remain  in effect (meaning this test can be used) for the duration of the COVID-19 declaration under Section 564(b)(1) of the Act, 21 U.S.C.section 360bbb-3(b)(1), unless the authorization is terminated  or revoked sooner.       Influenza A by PCR POSITIVE (A) NEGATIVE Final   Influenza B by PCR NEGATIVE NEGATIVE Final    Comment: (NOTE) The Xpert Xpress SARS-CoV-2/FLU/RSV plus assay is intended as an aid in the diagnosis of influenza from Nasopharyngeal swab specimens and should not be used as a sole basis for treatment. Nasal washings and aspirates are unacceptable for Xpert Xpress SARS-CoV-2/FLU/RSV testing.  Fact Sheet for Patients: BloggerCourse.com  Fact Sheet for Healthcare Providers: SeriousBroker.it  This test is not yet approved or cleared by the [place] FDA and has been authorized for detection and/or diagnosis of SARS-CoV-2 by FDA under an Emergency Use Authorization (EUA). This EUA will remain in effect  (meaning this test can be used) for the duration of the COVID-19 declaration under Section 564(b)(1) of the Act, 21 U.S.C. section 360bbb-3(b)(1), unless the authorization is terminated or revoked.     Resp Syncytial Virus by PCR NEGATIVE NEGATIVE Final    Comment: (NOTE) Fact Sheet for Patients: BloggerCourse.com  Fact Sheet for Healthcare Providers: SeriousBroker.it  This test is not yet approved or cleared by the [place] FDA and has been authorized for detection and/or diagnosis of SARS-CoV-2 by FDA under an Emergency Use Authorization (EUA). This EUA will remain in effect (meaning this test can be used) for the duration of the COVID-19 declaration under Section 564(b)(1) of the Act, 21 U.S.C. section 360bbb-3(b)(1), unless the authorization is terminated or revoked.  Performed at [facility], [address] [place], [place] [address]   MRSA Next Gen by PCR, Nasal     Status: None   Collection Time: 03/26/23  3:49 PM   Specimen: Nasal Mucosa; Nasal Swab  Result Value Ref Range Status   MRSA by PCR Next Gen NOT DETECTED NOT DETECTED  Final    Comment: (NOTE) The GeneXpert MRSA Assay (FDA approved for NASAL specimens only), is one component of a comprehensive MRSA colonization surveillance program. It is not intended to diagnose MRSA infection nor to guide or monitor treatment for MRSA infections. Test performance is not FDA approved in patients less than 72 years old. Performed at [facility], [address] [place], [place] [address]     Procedures and diagnostic studies:  No results found.             LOS: 6 days   Abhiraj Dozal  [facility]   Pager on [email or web address]. If 7PM-7AM, please contact night-coverage at [email or web address]     04/01/2023, 12:54 PM

## 2023-04-01 NOTE — Progress Notes (Signed)
 Patient was transfer to 1C, report was called prior to shift change. Belongings are pack and was sent to patient.

## 2023-04-01 NOTE — Progress Notes (Signed)
 Pt arrived to unit via transport, on RA, and in no acute distress

## 2023-04-01 NOTE — Progress Notes (Signed)
 Occupational Therapy Treatment Patient Details Name: Derek Lynch MRN: [id number] DOB: 02/21/1940 Today's Date: 04/01/2023   History of present illness Derek Lynch is a 83 y.o. male with medical history significant of HTN, HLD, IIDM, chronic ambulatory dysfunction, multiple always, presented with 2 days of cough shortness of breath wheezing malaise weakness and fall.   OT comments  Pt received asleep, crooked in bed. Found to be incontinent of BM, NT in room to assist with pericare and linen change (x2). Pt requires maxA for bed mobility, is able to stand using RW from elevated bed surface maxA +2, R foot blocked, and max cuing for anterior weight shift. Once standing, maxA +1 for standing balance as pt struggles to maintain upright posture and flexes at trunk despite cues. TotalA for pericare standing with bed linens changed. Returned to seated position, pt noted to have another BM and returned to supine to roll L<>R for pericare/linen/purewick change by NT. Pt limited by decreased balance, cognition, generalized weakness and impaired tolerance to activity. Discharge recommendation remains appropriate. OT will continue to follow.       If plan is discharge home, recommend the following:  Two people to help with walking and/or transfers;Two people to help with bathing/dressing/bathroom;Assistance with cooking/housework;Assist for transportation;Help with stairs or ramp for entrance;Direct supervision/assist for financial management;Direct supervision/assist for medications management;Supervision due to cognitive status   Equipment Recommendations  Other (comment)       Precautions / Restrictions Precautions Precautions: Fall Recall of Precautions/Restrictions: Impaired Restrictions Weight Bearing Restrictions Per Provider Order: No       Mobility Bed Mobility Overal bed mobility: Needs Assistance Bed Mobility: Supine to Sit, Sit to Supine Rolling: Max assist, +2 for physical  assistance   Supine to sit: Max assist Sit to supine: Max assist, +2 for physical assistance   General bed mobility comments: maxA for bed mobility, second BM pericare performed bed level with pt assisting minimally to roll    Transfers Overall transfer level: Needs assistance Equipment used: Rolling walker (2 wheels) Transfers: Sit to/from Stand Sit to Stand: Max assist                 Balance Overall balance assessment: Needs assistance Sitting-balance support: Feet supported, Bilateral upper extremity supported Sitting balance-Leahy Scale: Fair     Standing balance support: Bilateral upper extremity supported, During functional activity Standing balance-Leahy Scale: Poor Standing balance comment: maxA for standing balance, forward flexed posture, unable to correct with max multimodal cuing, pt self-limiting and moaning throughout but unable to elaborate if he was in pain                           ADL either performed or assessed with clinical judgement   ADL Overall ADL's : Needs assistance/impaired                 Upper Body Dressing : Sitting;Maximal assistance Upper Body Dressing Details (indicate cue type and reason): maxA to don/doff gown seated         Toileting- Clothing Manipulation and Hygiene: Total assistance;+2 for physical assistance;Sit to/from stand Toileting - Clothing Manipulation Details (indicate cue type and reason): requires maxA +1 for standing balance, total A for NT to provide posteriro pericare in standing with RW. Poor standing balance and tolerance, continous cues for upright posture, therapist's foot blocked to prevent R foot from sliding out     Functional mobility during ADLs: Maximal assistance;+2 for physical assistance;Rolling  walker (2 wheels) General ADL Comments: Pt found to be incontinent of BM x 2. NT in room with pericare and linen change x 2     Communication Communication Communication: Impaired Factors  Affecting Communication: Hearing impaired   Cognition Arousal: Alert Behavior During Therapy: Flat affect Cognition: No family/caregiver present to determine baseline                               Following commands: Impaired Following commands impaired: Follows one step commands inconsistently, Follows one step commands with increased time      Cueing   Cueing Techniques: Verbal cues, Tactile cues        General Comments Notified RN of redness around bottom/scrotum/bilateal LE    Pertinent Vitals/ Pain       Pain Assessment Pain Assessment: Faces Faces Pain Scale: Hurts even more Pain Location: everywhere Pain Descriptors / Indicators: Grimacing, Discomfort Pain Intervention(s): Limited activity within patient's tolerance         Frequency  Min 1X/week        Progress Toward Goals  OT Goals(current goals can now be found in the care plan section)  Progress towards OT goals: Progressing toward goals  Acute Rehab OT Goals OT Goal Formulation: Patient unable to participate in goal setting Time For Goal Achievement: 04/11/23 Potential to Achieve Goals: Fair ADL Goals Pt Will Perform Grooming: with supervision;sitting Pt Will Perform Lower Body Dressing: with min assist;sit to/from stand Pt Will Transfer to Toilet: with min assist;stand pivot transfer Pt Will Perform Toileting - Clothing Manipulation and hygiene: with min assist;sit to/from stand  Plan      Co-evaluation                 AM-PAC OT "6 Clicks" Daily Activity     Outcome Measure   Help from another person eating meals?: A Little Help from another person taking care of personal grooming?: A Little Help from another person toileting, which includes using toliet, bedpan, or urinal?: Total Help from another person bathing (including washing, rinsing, drying)?: A Lot Help from another person to put on and taking off regular upper body clothing?: A Lot Help from another person to  put on and taking off regular lower body clothing?: Total 6 Click Score: 12    End of Session Equipment Utilized During Treatment: Oxygen  OT Visit Diagnosis: Unsteadiness on feet (R26.81);Muscle weakness (generalized) (M62.81)   Activity Tolerance Patient tolerated treatment well   Patient Left in bed;with nursing/sitter in room;with call bell/phone within reach   Nurse Communication Mobility status (BM x 2 and skin integrity)        Time: 3086-5784 OT Time Calculation (min): 38 min  Charges: OT General Charges $OT Visit: 1 Visit OT Treatments $Self Care/Home Management : 38-52 mins  Janari Gagner L. Falynn Ailey, OTR/L  04/01/23, 12:32 PM

## 2023-04-02 DIAGNOSIS — R0902 Hypoxemia: Secondary | ICD-10-CM | POA: Diagnosis not present

## 2023-04-02 LAB — COMPREHENSIVE METABOLIC PANEL
ALT: 33 U/L (ref 0–44)
AST: 22 U/L (ref 15–41)
Albumin: 2.9 g/dL — ABNORMAL LOW (ref 3.5–5.0)
Alkaline Phosphatase: 33 U/L — ABNORMAL LOW (ref 38–126)
Anion gap: 13 (ref 5–15)
BUN: 32 mg/dL — ABNORMAL HIGH (ref 8–23)
CO2: 27 mmol/L (ref 22–32)
Calcium: 8.7 mg/dL — ABNORMAL LOW (ref 8.9–10.3)
Chloride: 101 mmol/L (ref 98–111)
Creatinine, Ser: 1.18 mg/dL (ref 0.61–1.24)
GFR, Estimated: >60 mL/min (ref >60)
Glucose, Bld: 238 mg/dL — ABNORMAL HIGH (ref 70–99)
Potassium: 3.6 mmol/L (ref 3.5–5.1)
Sodium: 141 mmol/L (ref 135–145)
Total Bilirubin: 0.8 mg/dL (ref 0.0–1.2)
Total Protein: 7 g/dL (ref 6.5–8.1)

## 2023-04-02 LAB — GLUCOSE, CAPILLARY
Glucose-Capillary: 176 mg/dL — ABNORMAL HIGH (ref 70–99)
Glucose-Capillary: 299 mg/dL — ABNORMAL HIGH (ref 70–99)
Glucose-Capillary: 320 mg/dL — ABNORMAL HIGH (ref 70–99)
Glucose-Capillary: 220 mg/dL — ABNORMAL HIGH (ref 70–99)

## 2023-04-02 NOTE — Progress Notes (Signed)
 Progress Note    Derek Lynch  AVW:[id number] DOB: Jun 03, 1940  DOA: 03/26/2023 PCP: Cheryll Dessert, FNP      Brief Narrative:    Medical records reviewed and are as summarized below:  Melodye Ped Traum is a 83 y.o. male  with medical history significant of HTN, HLD, IIDM, chronic ambulatory dysfunction, arthritis, who presented to the hospital with cough, shortness of breath, wheezing, malaise, general weakness and fall.  He felt very weak, unsteady and lightheaded and fell on his right arm.  He lives alone at home.  He was hypoxic with oxygen saturation of 77% on room air and confused when EMS arrived.  He was placed on oxygen via nonrebreather mask.        Assessment/Plan:   Principal Problem:   Hypoxia Active Problems:   Fall   Influenza A with pneumonia   Acute metabolic encephalopathy   CHF (congestive heart failure) (HCC)    Body mass index is 29.33 kg/m.  (Obesity)    Severe sepsis secondary to pneumonia influenza pneumonia and probable aspiration pneumonia: Completed 7 days of IV ceftriaxone and Flagyl on 04/01/2023.  Completed 5 days of Tamiflu on 03/31/2023. Continue droplet precautions   Acute hypoxic respiratory failure: Improved.  He has been weaned off of 4L oxygen and he is tolerating room air.   Previously treated with BiPAP.    Acute metabolic encephalopathy: Improved   Questionable syncope: echo shows EF 55-60%, diastolic function is indeterminate & no regional wall motion abnormalities. PT/OT recs SNF. Pt agrees to SNF     Rhabdomyolysis: Improved.  CK down to 283. CK was 6,211 on admission.    Elevated liver enzymes: Improved.  Probably from sepsis and rhabdomyolysis.     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  Continue NovoLog correction scale as needed.    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 Yes; Fluid consistency: Thin  Diet effective now                            Consultants: None  Procedures: None    Medications:    aspirin EC  81 mg Oral Daily   enoxaparin (LOVENOX) injection  0.5 mg/kg Subcutaneous Q24H   hydrocortisone   Topical BID   insulin aspart  0-5 Units Subcutaneous QHS   insulin aspart  0-9 Units Subcutaneous TID WC   insulin aspart  2 Units Subcutaneous TID WC   lisinopril  2.5 mg Oral Daily   Continuous Infusions:     Anti-infectives (From admission, onward)    Start     Dose/Rate Route Frequency Ordered Stop   03/27/23 1000  oseltamivir (TAMIFLU) capsule 30 mg        30 mg Oral 2 times daily 03/26/23 1719 03/31/23 2237   03/26/23 2200  metroNIDAZOLE (FLAGYL) IVPB 500 mg        500 mg 100 mL/hr over 60 Minutes Intravenous Every 12 hours 03/26/23 1807 04/01/23 2300   03/26/23 1806  cefTRIAXone (ROCEPHIN) 1 g in sodium chloride 0.9 % 100 mL IVPB        1 g 200 mL/hr over 30 Minutes Intravenous Every 24 hours 03/26/23 1807  04/01/23 2037   03/26/23 1430  vancomycin (VANCOCIN) IVPB 1000 mg/200 mL premix       Placed in "Followed by" Linked Group   1,000 mg 200 mL/hr over 60 Minutes Intravenous  Once 03/26/23 1319 03/26/23 2021   03/26/23 1330  vancomycin (VANCOCIN) IVPB 1000 mg/200 mL premix       Placed in "Followed by" Linked Group   1,000 mg 200 mL/hr over 60 Minutes Intravenous  Once 03/26/23 1319 03/26/23 1659   03/26/23 1315  oseltamivir (TAMIFLU) capsule 75 mg        75 mg Oral  Once 03/26/23 1259 03/26/23 1557   03/26/23 1200  ceFEPIme (MAXIPIME) 2 g in sodium chloride 0.9 % 100 mL IVPB        2 g 200 mL/hr over 30 Minutes Intravenous  Once 03/26/23 1147 03/26/23 1321   03/26/23 1200  metroNIDAZOLE (FLAGYL) IVPB 500 mg        500 mg 100 mL/hr over 60 Minutes Intravenous  Once 03/26/23 1147 03/26/23 1525   03/26/23 1200  vancomycin  (VANCOCIN) IVPB 1000 mg/200 mL premix  Status:  Discontinued        1,000 mg 200 mL/hr over 60 Minutes Intravenous  Once 03/26/23 1147 03/26/23 1319              Family Communication/Anticipated D/C date and plan/Code Status   DVT prophylaxis:      Code Status: Limited: Do not attempt resuscitation (DNR) -DNR-LIMITED -Do Not Intubate/DNI   Family Communication: None Disposition Plan: Plan to discharge to SNF   Status is: Inpatient Remains inpatient appropriate because: Awaiting placement to SNF      Subjective:   Interval events noted.  He still has a cough.  No shortness of breath or chest pain.  Objective:    Vitals:   04/01/23 2049 04/02/23 0431 04/02/23 0500 04/02/23 0734  BP: (!) 145/82 (!) 149/92  120/78  Pulse: 77 99  86  Resp: 20 18  19   Temp: 97.8 F (36.6 C) 97.8 F (36.6 C)  98.3 F (36.8 C)  TempSrc: Oral Oral    SpO2: 91% 93%  92%  Weight:   77.5 kg   Height:       No data found.   Intake/Output Summary (Last 24 hours) at 04/02/2023 1104 Last data filed at 04/02/2023 0900 Gross per 24 hour  Intake 2640 ml  Output 900 ml  Net 1740 ml   Filed Weights   03/26/23 1054 04/01/23 0500 04/02/23 0500  Weight: 88.4 kg 78.4 kg 77.5 kg    Exam:  GEN: NAD SKIN: Warm and dry EYES: No pallor or icterus ENT: MMM CV: RRR PULM: Bibasilar rales and b/l rhonchi ABD: soft, ND, NT, +BS CNS: AAO x 3, non focal EXT: No edema or tenderness        Data Reviewed:   I have personally reviewed following labs and imaging studies:  Labs: Labs show the following:   Basic Metabolic Panel: Recent Labs  Lab 03/28/23 0425 03/29/23 0520 03/30/23 0505 03/31/23 0631 04/02/23 0841  NA 135 139 139 140 141  K 3.5 3.4* 3.4* 3.7 3.6  CL 99 98 100 102 101  CO2 25 28 27 28 27   GLUCOSE 150* 142* 146* 148* 238*  BUN 23 24* 24* 21 32*  CREATININE 1.20 1.18 1.07 1.03 1.18  CALCIUM 8.1* 8.3* 8.3* 8.6* 8.7*   GFR Estimated Creatinine Clearance: 47  mL/min (by C-G formula based on SCr of 1.18 mg/dL).  Liver Function Tests: Recent Labs  Lab 03/28/23 0425 03/29/23 0520 03/30/23 0505 03/31/23 0631 04/02/23 0841  AST 124* 83* 55* 47* 22  ALT 51* 48* 45* 45* 33  ALKPHOS 25* 25* 28* 33* 33*  BILITOT 0.6 0.8 0.6 0.6 0.8  PROT 6.6 6.3* 6.4* 7.1 7.0  ALBUMIN 2.7* 2.7* 2.8* 2.9* 2.9*   Recent Labs  Lab 03/26/23 1048  LIPASE 24   No results for input(s): "AMMONIA" in the last 168 hours. Coagulation profile Recent Labs  Lab 03/26/23 1048  INR 1.1    CBC: Recent Labs  Lab 03/26/23 1048 03/27/23 0428 03/28/23 0425 03/29/23 0520 03/30/23 0505 03/31/23 0631  WBC 8.5 6.5 7.1 5.0 5.1 7.4  NEUTROABS 6.9  --   --   --   --   --   HGB 12.6* 11.5* 10.9* 10.9* 11.4* 12.3*  HCT 37.8* 33.7* 31.7* 31.7* 33.0* 36.1*  MCV 94.3 91.6 90.8 91.4 89.9 90.9  PLT 199 176 178 173 194 231   Cardiac Enzymes: Recent Labs  Lab 03/27/23 0428 03/28/23 0425 03/29/23 0520 03/30/23 0505 03/31/23 0631  CKTOTAL 6,211* 4,272* 1,781* 643* 283   BNP (last 3 results) No results for input(s): "PROBNP" in the last 8760 hours. CBG: Recent Labs  Lab 04/01/23 0907 04/01/23 1240 04/01/23 1647 04/01/23 2103 04/02/23 0735  GLUCAP 165* 220* 221* 183* 176*   D-Dimer: No results for input(s): "DDIMER" in the last 72 hours. Hgb A1c: No results for input(s): "HGBA1C" in the last 72 hours. Lipid Profile: No results for input(s): "CHOL", "HDL", "LDLCALC", "TRIG", "CHOLHDL", "LDLDIRECT" in the last 72 hours. Thyroid function studies: No results for input(s): "TSH", "T4TOTAL", "T3FREE", "THYROIDAB" in the last 72 hours.  Invalid input(s): "FREET3" Anemia work up: No results for input(s): "VITAMINB12", "FOLATE", "FERRITIN", "TIBC", "IRON", "RETICCTPCT" in the last 72 hours. Sepsis Labs: Recent Labs  Lab 03/26/23 1048 03/26/23 1329 03/27/23 0428 03/28/23 0425 03/29/23 0520 03/30/23 0505 03/31/23 0631  WBC 8.5  --    < > 7.1 5.0 5.1 7.4   LATICACIDVEN 3.6* 2.2*  --   --   --   --   --    < > = values in this interval not displayed.    Microbiology Recent Results (from the past 240 hours)  Blood Culture (routine x 2)     Status: None   Collection Time: 03/26/23 10:48 AM   Specimen: Right Antecubital; Blood  Result Value Ref Range Status   Specimen Description RIGHT ANTECUBITAL  Final   Special Requests   Final    Blood Culture results may not be optimal due to an inadequate volume of blood received in culture bottles   Culture   Final    NO GROWTH 5 DAYS Performed at [facility], [address], [place], [place] [address]    Report Status 03/31/2023 FINAL  Final  Blood Culture (routine x 2)     Status: None   Collection Time: 03/26/23 10:48 AM   Specimen: BLOOD  Result Value Ref Range Status   Specimen Description BLOOD BLOOD RIGHT HAND  Final   Special Requests   Final    Blood Culture results may not be optimal due to an inadequate volume of blood received in culture bottles   Culture   Final    NO GROWTH 5 DAYS Performed at [facility], [address], [place], [place] [address]    Report Status 03/31/2023 FINAL  Final  Resp panel by RT-PCR (RSV, Flu A&B,  Covid) Anterior Nasal Swab     Status: Abnormal   Collection Time: 03/26/23 10:48 AM   Specimen: Anterior Nasal Swab  Result Value Ref Range Status   SARS Coronavirus 2 by RT PCR NEGATIVE NEGATIVE Final    Comment: (NOTE) SARS-CoV-2 target nucleic acids are NOT DETECTED.  The SARS-CoV-2 RNA is generally detectable in upper respiratory specimens during the acute phase of infection. The lowest concentration of SARS-CoV-2 viral copies this assay can detect is 138 copies/mL. A negative result does not preclude SARS-Cov-2 infection and should not be used as the sole basis for treatment or other patient management decisions. A negative result may occur with  improper specimen collection/handling, submission of specimen other than  nasopharyngeal swab, presence of viral mutation(s) within the areas targeted by this assay, and inadequate number of viral copies(<138 copies/mL). A negative result must be combined with clinical observations, patient history, and epidemiological information. The expected result is Negative.  Fact Sheet for Patients:  BloggerCourse.com  Fact Sheet for Healthcare Providers:  SeriousBroker.it  This test is no t yet approved or cleared by the [place] FDA and  has been authorized for detection and/or diagnosis of SARS-CoV-2 by FDA under an Emergency Use Authorization (EUA). This EUA will remain  in effect (meaning this test can be used) for the duration of the COVID-19 declaration under Section 564(b)(1) of the Act, 21 U.S.C.section 360bbb-3(b)(1), unless the authorization is terminated  or revoked sooner.       Influenza A by PCR POSITIVE (A) NEGATIVE Final   Influenza B by PCR NEGATIVE NEGATIVE Final    Comment: (NOTE) The Xpert Xpress SARS-CoV-2/FLU/RSV plus assay is intended as an aid in the diagnosis of influenza from Nasopharyngeal swab specimens and should not be used as a sole basis for treatment. Nasal washings and aspirates are unacceptable for Xpert Xpress SARS-CoV-2/FLU/RSV testing.  Fact Sheet for Patients: BloggerCourse.com  Fact Sheet for Healthcare Providers: SeriousBroker.it  This test is not yet approved or cleared by the [place] FDA and has been authorized for detection and/or diagnosis of SARS-CoV-2 by FDA under an Emergency Use Authorization (EUA). This EUA will remain in effect (meaning this test can be used) for the duration of the COVID-19 declaration under Section 564(b)(1) of the Act, 21 U.S.C. section 360bbb-3(b)(1), unless the authorization is terminated or revoked.     Resp Syncytial Virus by PCR NEGATIVE NEGATIVE Final    Comment:  (NOTE) Fact Sheet for Patients: BloggerCourse.com  Fact Sheet for Healthcare Providers: SeriousBroker.it  This test is not yet approved or cleared by the [place] FDA and has been authorized for detection and/or diagnosis of SARS-CoV-2 by FDA under an Emergency Use Authorization (EUA). This EUA will remain in effect (meaning this test can be used) for the duration of the COVID-19 declaration under Section 564(b)(1) of the Act, 21 U.S.C. section 360bbb-3(b)(1), unless the authorization is terminated or revoked.  Performed at [facility], [address] [place], [place] [address]   MRSA Next Gen by PCR, Nasal     Status: None   Collection Time: 03/26/23  3:49 PM   Specimen: Nasal Mucosa; Nasal Swab  Result Value Ref Range Status   MRSA by PCR Next Gen NOT DETECTED NOT DETECTED Final    Comment: (NOTE) The GeneXpert MRSA Assay (FDA approved for NASAL specimens only), is one component of a comprehensive MRSA colonization surveillance program. It is not intended to diagnose MRSA infection nor to guide or monitor treatment for MRSA infections.  Test performance is not FDA approved in patients less than 63 years old. Performed at [facility], [address] [place], [place] [address]     Procedures and diagnostic studies:  No results found.             LOS: 7 days   Quanetta Truss  [facility]   Pager on [email or web address]. If 7PM-7AM, please contact night-coverage at [email or web address]     04/02/2023, 11:04 AM

## 2023-04-02 NOTE — Plan of Care (Signed)
  Problem: Education: Goal: Ability to describe self-care measures that may prevent or decrease complications (Diabetes Survival Skills Education) will improve Outcome: Progressing Goal: Individualized Educational Video(s) Outcome: Progressing   Problem: Coping: Goal: Ability to adjust to condition or change in health will improve Outcome: Progressing   Problem: Fluid Volume: Goal: Ability to maintain a balanced intake and output will improve Outcome: Progressing   Problem: Health Behavior/Discharge Planning: Goal: Ability to identify and utilize available resources and services will improve Outcome: Progressing Goal: Ability to manage health-related needs will improve Outcome: Progressing   Problem: Metabolic: Goal: Ability to maintain appropriate glucose levels will improve Outcome: Progressing   Problem: Nutritional: Goal: Maintenance of adequate nutrition will improve Outcome: Progressing Goal: Progress toward achieving an optimal weight will improve Outcome: Progressing   Problem: Tissue Perfusion: Goal: Adequacy of tissue perfusion will improve Outcome: Progressing   Problem: Education: Goal: Knowledge of General Education information will improve Description: Including pain rating scale, medication(s)/side effects and non-pharmacologic comfort measures Outcome: Progressing   Problem: Clinical Measurements: Goal: Ability to maintain clinical measurements within normal limits will improve Outcome: Progressing Goal: Will remain free from infection Outcome: Progressing Goal: Diagnostic test results will improve Outcome: Progressing Goal: Respiratory complications will improve Outcome: Progressing Goal: Cardiovascular complication will be avoided Outcome: Progressing   Problem: Activity: Goal: Risk for activity intolerance will decrease Outcome: Progressing

## 2023-04-02 NOTE — Plan of Care (Signed)

## 2023-04-03 ENCOUNTER — Inpatient Hospital Stay

## 2023-04-03 DIAGNOSIS — R0902 Hypoxemia: Secondary | ICD-10-CM | POA: Diagnosis not present

## 2023-04-03 LAB — CBC WITH DIFFERENTIAL/PLATELET
Abs Immature Granulocytes: 0.75 10*3/uL — ABNORMAL HIGH (ref 0.00–0.07)
Basophils Absolute: 0.1 10*3/uL (ref 0.0–0.1)
Basophils Relative: 1 %
Eosinophils Absolute: 0.1 10*3/uL (ref 0.0–0.5)
Eosinophils Relative: 1 %
HCT: 38.2 % — ABNORMAL LOW (ref 39.0–52.0)
Hemoglobin: 12.9 g/dL — ABNORMAL LOW (ref 13.0–17.0)
Immature Granulocytes: 7 %
Lymphocytes Relative: 15 %
Lymphs Abs: 1.5 10*3/uL (ref 0.7–4.0)
MCH: 30.2 pg (ref 26.0–34.0)
MCHC: 33.8 g/dL (ref 30.0–36.0)
MCV: 89.5 fL (ref 80.0–100.0)
Monocytes Absolute: 1.1 10*3/uL — ABNORMAL HIGH (ref 0.1–1.0)
Monocytes Relative: 11 %
Neutro Abs: 6.6 10*3/uL (ref 1.7–7.7)
Neutrophils Relative %: 65 %
Platelets: 301 10*3/uL (ref 150–400)
RBC: 4.27 MIL/uL (ref 4.22–5.81)
RDW: 13.2 % (ref 11.5–15.5)
Smear Review: NORMAL
WBC: 10.2 10*3/uL (ref 4.0–10.5)
nRBC: 0.3 % — ABNORMAL HIGH (ref 0.0–0.2)

## 2023-04-03 LAB — GLUCOSE, CAPILLARY
Glucose-Capillary: 230 mg/dL — ABNORMAL HIGH (ref 70–99)
Glucose-Capillary: 234 mg/dL — ABNORMAL HIGH (ref 70–99)
Glucose-Capillary: 249 mg/dL — ABNORMAL HIGH (ref 70–99)
Glucose-Capillary: 170 mg/dL — ABNORMAL HIGH (ref 70–99)

## 2023-04-03 NOTE — Progress Notes (Signed)
 Physical Therapy Treatment Patient Details Name: Derek Lynch MRN: [id number] DOB: 01-Feb-1940 Today's Date: 04/03/2023   History of Present Illness Derek Lynch is a 83 y.o. male with medical history significant of HTN, HLD, IIDM, chronic ambulatory dysfunction, multiple always, presented with 2 days of cough shortness of breath wheezing malaise weakness and fall.    PT Comments  Pt seen for PT tx with pt agreeable only after MAX encouragement/education. Pt very self limiting throughout session. Pt is able to complete bed mobility with mod assist, STS with max assist, & even progress to ambulating ~5 ft in room with RW & chair follow for safety. Pt would benefit from ongoing PT services to address strengthening, balance, & endurance to increase independence & decrease fall risk & caregiver burden.    If plan is discharge home, recommend the following: Two people to help with walking and/or transfers;Two people to help with bathing/dressing/bathroom;Help with stairs or ramp for entrance;Assist for transportation;Assistance with cooking/housework   Can travel by private vehicle     No  Equipment Recommendations  Other (comment) (defer to next venue)    Recommendations for Other Services       Precautions / Restrictions Precautions Precautions: Fall Recall of Precautions/Restrictions: Impaired Restrictions Weight Bearing Restrictions Per Provider Order: No     Mobility  Bed Mobility Overal bed mobility: Needs Assistance Bed Mobility: Supine to Sit     Supine to sit: Min assist, HOB elevated, Used rails     General bed mobility comments: Pt able to bring BLE off EOB, begins to upright trunk but requires assistance to fully upright trunk. MAX cuing to attempt bed mobility himself as pt attempting to tell PT to move him first.    Transfers Overall transfer level: Needs assistance Equipment used: Rolling walker (2 wheels) Transfers: Sit to/from Stand, Bed to  chair/wheelchair/BSC Sit to Stand: Max assist   Step pivot transfers: Min assist       General transfer comment: STS from elevated EOB & recliner with cuing re: hand placement, need to scoot forwards to edge of seat, feet underneath BOS. Pt with difficulty as BLE feet slide forwards, requires assistance to shift weight anteriorly & power up to standing.    Ambulation/Gait Ambulation/Gait assistance: Min assist Gait Distance (Feet): 5 Feet Assistive device: Rolling walker (2 wheels) Gait Pattern/deviations: Step-to pattern, Decreased stride length, Trunk flexed Gait velocity: decreased     General Gait Details: Pushing RW out in front of him, BLE hips/knees flexed throughout, cuing to continue gait as able, chair follow for safety. Decreased foot clearance BLE.   Stairs             Wheelchair Mobility     Tilt Bed    Modified Rankin (Stroke Patients Only)       Balance Overall balance assessment: Needs assistance Sitting-balance support: Feet supported, Bilateral upper extremity supported Sitting balance-Leahy Scale: Fair     Standing balance support: Bilateral upper extremity supported, During functional activity, Reliant on assistive device for balance Standing balance-Leahy Scale: Poor                              Communication Communication Communication: Impaired Factors Affecting Communication: Hearing impaired  Cognition Arousal: Alert Behavior During Therapy: Flat affect   PT - Cognitive impairments: No family/caregiver present to determine baseline Difficult to assess due to: Hard of hearing/deaf  PT - Cognition Comments: Pt requires MAX encouragement to engage with PT. Able to recall current city, hospital. Poor memory as PT educated pt on reason for hospitalization & pt unable to recall at end of session. Pt very resistant to perform any activities for himself, including feeding himself, as pt almost demanded  PT or someone else feed him breakfast. PT educated him on ability to feed himself & pt eventually able to do so. Pt also attempts to answer "I don't know" to all questions, with PT encouraging pt to actually answer question. Following commands: Impaired Following commands impaired: Follows one step commands inconsistently, Follows one step commands with increased time    Cueing Cueing Techniques: Verbal cues, Tactile cues  Exercises      General Comments General comments (skin integrity, edema, etc.): Pt incontinent of BM, pt unaware. Dependent assist for peri hygiene. Pt with skin tear to L upper chest, BLE red & one slightly swollen - RN & MD made aware. Pt set up with meal tray & feeding himself at end of session. Does report he wears glasses at baseline & his vision is not very good right now. Pt also endorses having an aide at home that helps him bathe 1x/week.      Pertinent Vitals/Pain Pain Assessment Pain Assessment: No/denies pain (denies pain despite moaning/groaning throughout session)    Home Living                          Prior Function            PT Goals (current goals can now be found in the care plan section) Acute Rehab PT Goals Patient Stated Goal: did not state PT Goal Formulation: Patient unable to participate in goal setting Time For Goal Achievement: 04/13/23 Potential to Achieve Goals: Fair Progress towards PT goals: Progressing toward goals    Frequency    Min 1X/week      PT Plan      Co-evaluation              AM-PAC PT "6 Clicks" Mobility   Outcome Measure  Help needed turning from your back to your side while in a flat bed without using bedrails?: A Little Help needed moving from lying on your back to sitting on the side of a flat bed without using bedrails?: A Lot Help needed moving to and from a bed to a chair (including a wheelchair)?: A Lot Help needed standing up from a chair using your arms (e.g., wheelchair or  bedside chair)?: Total Help needed to walk in hospital room?: Total Help needed climbing 3-5 steps with a railing? : Total 6 Click Score: 10    End of Session   Activity Tolerance: Patient tolerated treatment well;Patient limited by fatigue Patient left: in chair;with chair alarm set;with call bell/phone within reach Nurse Communication: Mobility status PT Visit Diagnosis: Unsteadiness on feet (R26.81);Other abnormalities of gait and mobility (R26.89);Repeated falls (R29.6);Muscle weakness (generalized) (M62.81);History of falling (Z91.81);Difficulty in walking, not elsewhere classified (R26.2)     Time: 2440-1027 PT Time Calculation (min) (ACUTE ONLY): 23 min  Charges:    $Therapeutic Activity: 23-37 mins PT General Charges $$ ACUTE PT VISIT: 1 Visit                     Aleda Grana, PT, DPT 04/03/23, 9:24 AM   Sandi Mariscal 04/03/2023, 9:23 AM

## 2023-04-03 NOTE — Progress Notes (Signed)
 Progress Note    Derek Lynch  VOZ:[id number] DOB: 05-27-1940  DOA: 03/26/2023 PCP: Cheryll Dessert, FNP      Brief Narrative:    Medical records reviewed and are as summarized below:  Derek Lynch is a 83 y.o. male  with medical history significant of HTN, HLD, IIDM, chronic ambulatory dysfunction, arthritis, who presented to the hospital with cough, shortness of breath, wheezing, malaise, general weakness and fall.  He felt very weak, unsteady and lightheaded and fell on his right arm.  He lives alone at home.  He was hypoxic with oxygen saturation of 77% on room air and confused when EMS arrived.  He was placed on oxygen via nonrebreather mask.        Assessment/Plan:   Principal Problem:   Hypoxia Active Problems:   Fall   Influenza A with pneumonia   Acute metabolic encephalopathy   CHF (congestive heart failure) (HCC)    Body mass index is 29.33 kg/m.  (Obesity)    Severe sepsis secondary to pneumonia influenza pneumonia and probable aspiration pneumonia: Completed 7 days of IV ceftriaxone and Flagyl on 04/01/2023.  Completed 5 days of Tamiflu on 03/31/2023. Continue droplet precautions   Acute hypoxic respiratory failure: Improved.  He has been weaned off of 4L oxygen and he is tolerating room air.   Previously treated with BiPAP.    Acute metabolic encephalopathy: Improved   Questionable syncope: echo shows EF 55-60%, diastolic function is indeterminate & no regional wall motion abnormalities. PT/OT recs SNF. Pt agrees to SNF     Rhabdomyolysis: Improved.  CK down to 283. CK was 6,211 on admission.    Elevated liver enzymes: Improved.  Probably from sepsis and rhabdomyolysis.     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  Continue NovoLog correction scale as needed.    AKI: Improved Creatinine was 1.43 on admission    Venous stasis dermatitis bilateral legs: continue on home dose of topical steroid Right leg  appears slightly bigger and tender than the left.  Obtain venous duplex of the right lower extremity to rule out DVT.      Diet Order             DIET DYS 3 Room service appropriate? Yes; Fluid consistency: Thin  Diet effective now                            Consultants: None  Procedures: None    Medications:    aspirin EC  81 mg Oral Daily   enoxaparin (LOVENOX) injection  0.5 mg/kg Subcutaneous Q24H   hydrocortisone   Topical BID   insulin aspart  0-5 Units Subcutaneous QHS   insulin aspart  0-9 Units Subcutaneous TID WC   insulin aspart  2 Units Subcutaneous TID WC   lisinopril  2.5 mg Oral Daily   Continuous Infusions:     Anti-infectives (From admission, onward)    Start     Dose/Rate Route Frequency Ordered Stop   03/27/23 1000  oseltamivir (TAMIFLU) capsule 30 mg        30 mg Oral 2 times daily 03/26/23 1719 03/31/23 2237   03/26/23 2200  metroNIDAZOLE (FLAGYL) IVPB 500 mg        500 mg 100 mL/hr over 60 Minutes Intravenous Every 12 hours 03/26/23 1807 04/01/23 2300   03/26/23 1806  cefTRIAXone (ROCEPHIN) 1 g in sodium chloride 0.9 % 100 mL  IVPB        1 g 200 mL/hr over 30 Minutes Intravenous Every 24 hours 03/26/23 1807 04/01/23 2037   03/26/23 1430  vancomycin (VANCOCIN) IVPB 1000 mg/200 mL premix       Placed in "Followed by" Linked Group   1,000 mg 200 mL/hr over 60 Minutes Intravenous  Once 03/26/23 1319 03/26/23 2021   03/26/23 1330  vancomycin (VANCOCIN) IVPB 1000 mg/200 mL premix       Placed in "Followed by" Linked Group   1,000 mg 200 mL/hr over 60 Minutes Intravenous  Once 03/26/23 1319 03/26/23 1659   03/26/23 1315  oseltamivir (TAMIFLU) capsule 75 mg        75 mg Oral  Once 03/26/23 1259 03/26/23 1557   03/26/23 1200  ceFEPIme (MAXIPIME) 2 g in sodium chloride 0.9 % 100 mL IVPB        2 g 200 mL/hr over 30 Minutes Intravenous  Once 03/26/23 1147 03/26/23 1321   03/26/23 1200  metroNIDAZOLE (FLAGYL) IVPB 500 mg        500  mg 100 mL/hr over 60 Minutes Intravenous  Once 03/26/23 1147 03/26/23 1525   03/26/23 1200  vancomycin (VANCOCIN) IVPB 1000 mg/200 mL premix  Status:  Discontinued        1,000 mg 200 mL/hr over 60 Minutes Intravenous  Once 03/26/23 1147 03/26/23 1319              Family Communication/Anticipated D/C date and plan/Code Status   DVT prophylaxis:      Code Status: Limited: Do not attempt resuscitation (DNR) -DNR-LIMITED -Do Not Intubate/DNI   Family Communication: None Disposition Plan: Plan to discharge to SNF   Status is: Inpatient Remains inpatient appropriate because: Awaiting placement to SNF      Subjective:   Interval events noted.  He has no complaints.  He was coughing repeatedly during this encounter.  Objective:    Vitals:   04/02/23 1641 04/02/23 2011 04/03/23 0354 04/03/23 0822  BP: (!) 133/94 (!) 170/100 (!) 158/93 111/75  Pulse: 78 87 92 (!) 101  Resp: 16  18 16   Temp: 98.6 F (37 C)  98.6 F (37 C) 98.1 F (36.7 C)  TempSrc:   Oral   SpO2: 95% 94% 93% 94%  Weight:      Height:       No data found.   Intake/Output Summary (Last 24 hours) at 04/03/2023 1434 Last data filed at 04/02/2023 1655 Gross per 24 hour  Intake --  Output 150 ml  Net -150 ml   Filed Weights   03/26/23 1054 04/01/23 0500 04/02/23 0500  Weight: 88.4 kg 78.4 kg 77.5 kg    Exam:  GEN: NAD SKIN: Warm and dry EYES: No pallor or icterus ENT: MMM CV: RRR PULM: Mild bilateral rales and rhonchi ABD: soft, ND, NT, +BS CNS: AAO x 3, non focal EXT: No edema or tenderness        Data Reviewed:   I have personally reviewed following labs and imaging studies:  Labs: Labs show the following:   Basic Metabolic Panel: Recent Labs  Lab 03/28/23 0425 03/29/23 0520 03/30/23 0505 03/31/23 0631 04/02/23 0841  NA 135 139 139 140 141  K 3.5 3.4* 3.4* 3.7 3.6  CL 99 98 100 102 101  CO2 25 28 27 28 27   GLUCOSE 150* 142* 146* 148* 238*  BUN 23 24* 24* 21  32*  CREATININE 1.20 1.18 1.07 1.03 1.18  CALCIUM 8.1* 8.3*  8.3* 8.6* 8.7*   GFR Estimated Creatinine Clearance: 47 mL/min (by C-G formula based on SCr of 1.18 mg/dL). Liver Function Tests: Recent Labs  Lab 03/28/23 0425 03/29/23 0520 03/30/23 0505 03/31/23 0631 04/02/23 0841  AST 124* 83* 55* 47* 22  ALT 51* 48* 45* 45* 33  ALKPHOS 25* 25* 28* 33* 33*  BILITOT 0.6 0.8 0.6 0.6 0.8  PROT 6.6 6.3* 6.4* 7.1 7.0  ALBUMIN 2.7* 2.7* 2.8* 2.9* 2.9*   No results for input(s): "LIPASE", "AMYLASE" in the last 168 hours.  No results for input(s): "AMMONIA" in the last 168 hours. Coagulation profile No results for input(s): "INR", "PROTIME" in the last 168 hours.   CBC: Recent Labs  Lab 03/28/23 0425 03/29/23 0520 03/30/23 0505 03/31/23 0631 04/03/23 0512  WBC 7.1 5.0 5.1 7.4 10.2  NEUTROABS  --   --   --   --  6.6  HGB 10.9* 10.9* 11.4* 12.3* 12.9*  HCT 31.7* 31.7* 33.0* 36.1* 38.2*  MCV 90.8 91.4 89.9 90.9 89.5  PLT 178 173 194 231 301   Cardiac Enzymes: Recent Labs  Lab 03/28/23 0425 03/29/23 0520 03/30/23 0505 03/31/23 0631  CKTOTAL 4,272* 1,781* 643* 283   BNP (last 3 results) No results for input(s): "PROBNP" in the last 8760 hours. CBG: Recent Labs  Lab 04/02/23 1136 04/02/23 1642 04/02/23 2129 04/03/23 0824 04/03/23 1120  GLUCAP 299* 320* 220* 230* 234*   D-Dimer: No results for input(s): "DDIMER" in the last 72 hours. Hgb A1c: No results for input(s): "HGBA1C" in the last 72 hours. Lipid Profile: No results for input(s): "CHOL", "HDL", "LDLCALC", "TRIG", "CHOLHDL", "LDLDIRECT" in the last 72 hours. Thyroid function studies: No results for input(s): "TSH", "T4TOTAL", "T3FREE", "THYROIDAB" in the last 72 hours.  Invalid input(s): "FREET3" Anemia work up: No results for input(s): "VITAMINB12", "FOLATE", "FERRITIN", "TIBC", "IRON", "RETICCTPCT" in the last 72 hours. Sepsis Labs: Recent Labs  Lab 03/29/23 0520 03/30/23 0505 03/31/23 0631  04/03/23 0512  WBC 5.0 5.1 7.4 10.2    Microbiology Recent Results (from the past 240 hours)  Blood Culture (routine x 2)     Status: None   Collection Time: 03/26/23 10:48 AM   Specimen: Right Antecubital; Blood  Result Value Ref Range Status   Specimen Description RIGHT ANTECUBITAL  Final   Special Requests   Final    Blood Culture results may not be optimal due to an inadequate volume of blood received in culture bottles   Culture   Final    NO GROWTH 5 DAYS Performed at [facility], [address] [place], [place] [address]    Report Status 03/31/2023 FINAL  Final  Blood Culture (routine x 2)     Status: None   Collection Time: 03/26/23 10:48 AM   Specimen: BLOOD  Result Value Ref Range Status   Specimen Description BLOOD BLOOD RIGHT HAND  Final   Special Requests   Final    Blood Culture results may not be optimal due to an inadequate volume of blood received in culture bottles   Culture   Final    NO GROWTH 5 DAYS Performed at [facility], [address], [place], [place] [address]    Report Status 03/31/2023 FINAL  Final  Resp panel by RT-PCR (RSV, Flu A&B, Covid) Anterior Nasal Swab     Status: Abnormal   Collection Time: 03/26/23 10:48 AM   Specimen: Anterior Nasal Swab  Result Value Ref Range Status   SARS Coronavirus 2 by  RT PCR NEGATIVE NEGATIVE Final    Comment: (NOTE) SARS-CoV-2 target nucleic acids are NOT DETECTED.  The SARS-CoV-2 RNA is generally detectable in upper respiratory specimens during the acute phase of infection. The lowest concentration of SARS-CoV-2 viral copies this assay can detect is 138 copies/mL. A negative result does not preclude SARS-Cov-2 infection and should not be used as the sole basis for treatment or other patient management decisions. A negative result may occur with  improper specimen collection/handling, submission of specimen other than nasopharyngeal swab, presence of viral mutation(s) within  the areas targeted by this assay, and inadequate number of viral copies(<138 copies/mL). A negative result must be combined with clinical observations, patient history, and epidemiological information. The expected result is Negative.  Fact Sheet for Patients:  BloggerCourse.com  Fact Sheet for Healthcare Providers:  SeriousBroker.it  This test is no t yet approved or cleared by the [place] FDA and  has been authorized for detection and/or diagnosis of SARS-CoV-2 by FDA under an Emergency Use Authorization (EUA). This EUA will remain  in effect (meaning this test can be used) for the duration of the COVID-19 declaration under Section 564(b)(1) of the Act, 21 U.S.C.section 360bbb-3(b)(1), unless the authorization is terminated  or revoked sooner.       Influenza A by PCR POSITIVE (A) NEGATIVE Final   Influenza B by PCR NEGATIVE NEGATIVE Final    Comment: (NOTE) The Xpert Xpress SARS-CoV-2/FLU/RSV plus assay is intended as an aid in the diagnosis of influenza from Nasopharyngeal swab specimens and should not be used as a sole basis for treatment. Nasal washings and aspirates are unacceptable for Xpert Xpress SARS-CoV-2/FLU/RSV testing.  Fact Sheet for Patients: BloggerCourse.com  Fact Sheet for Healthcare Providers: SeriousBroker.it  This test is not yet approved or cleared by the [place] FDA and has been authorized for detection and/or diagnosis of SARS-CoV-2 by FDA under an Emergency Use Authorization (EUA). This EUA will remain in effect (meaning this test can be used) for the duration of the COVID-19 declaration under Section 564(b)(1) of the Act, 21 U.S.C. section 360bbb-3(b)(1), unless the authorization is terminated or revoked.     Resp Syncytial Virus by PCR NEGATIVE NEGATIVE Final    Comment: (NOTE) Fact Sheet for  Patients: BloggerCourse.com  Fact Sheet for Healthcare Providers: SeriousBroker.it  This test is not yet approved or cleared by the [place] FDA and has been authorized for detection and/or diagnosis of SARS-CoV-2 by FDA under an Emergency Use Authorization (EUA). This EUA will remain in effect (meaning this test can be used) for the duration of the COVID-19 declaration under Section 564(b)(1) of the Act, 21 U.S.C. section 360bbb-3(b)(1), unless the authorization is terminated or revoked.  Performed at [facility], [address] [place], [place] [address]   MRSA Next Gen by PCR, Nasal     Status: None   Collection Time: 03/26/23  3:49 PM   Specimen: Nasal Mucosa; Nasal Swab  Result Value Ref Range Status   MRSA by PCR Next Gen NOT DETECTED NOT DETECTED Final    Comment: (NOTE) The GeneXpert MRSA Assay (FDA approved for NASAL specimens only), is one component of a comprehensive MRSA colonization surveillance program. It is not intended to diagnose MRSA infection nor to guide or monitor treatment for MRSA infections. Test performance is not FDA approved in patients less than 61 years old. Performed at [facility], [address] [place], [place] [address]     Procedures and diagnostic studies:  No  results found.             LOS: 8 days   Derek Lynch  [facility]   Pager on [email or web address]. If 7PM-7AM, please contact night-coverage at [email or web address]     04/03/2023, 2:34 PM

## 2023-04-04 DIAGNOSIS — R0902 Hypoxemia: Secondary | ICD-10-CM | POA: Diagnosis not present

## 2023-04-04 LAB — GLUCOSE, CAPILLARY
Glucose-Capillary: 209 mg/dL — ABNORMAL HIGH (ref 70–99)
Glucose-Capillary: 215 mg/dL — ABNORMAL HIGH (ref 70–99)
Glucose-Capillary: 178 mg/dL — ABNORMAL HIGH (ref 70–99)
Glucose-Capillary: 207 mg/dL — ABNORMAL HIGH (ref 70–99)
Glucose-Capillary: 197 mg/dL — ABNORMAL HIGH (ref 70–99)

## 2023-04-04 MED ORDER — ENOXAPARIN SODIUM 40 MG/0.4ML IJ SOSY
40.0000 mg | PREFILLED_SYRINGE | INTRAMUSCULAR | Status: DC
  Administered 2023-04-04: 40 mg via SUBCUTANEOUS
  Filled 2023-04-04: qty 0.4

## 2023-04-04 NOTE — Plan of Care (Signed)
  Problem: Education: Goal: Ability to describe self-care measures that may prevent or decrease complications (Diabetes Survival Skills Education) will improve Outcome: Progressing Goal: Individualized Educational Video(s) Outcome: Progressing   Problem: Coping: Goal: Ability to adjust to condition or change in health will improve Outcome: Progressing   Problem: Fluid Volume: Goal: Ability to maintain a balanced intake and output will improve Outcome: Progressing   Problem: Health Behavior/Discharge Planning: Goal: Ability to identify and utilize available resources and services will improve Outcome: Progressing Goal: Ability to manage health-related needs will improve Outcome: Progressing   Problem: Metabolic: Goal: Ability to maintain appropriate glucose levels will improve Outcome: Progressing   Problem: Nutritional: Goal: Maintenance of adequate nutrition will improve Outcome: Progressing Goal: Progress toward achieving an optimal weight will improve Outcome: Progressing   Problem: Skin Integrity: Goal: Risk for impaired skin integrity will decrease Outcome: Progressing   Problem: Education: Goal: Knowledge of General Education information will improve Description: Including pain rating scale, medication(s)/side effects and non-pharmacologic comfort measures Outcome: Progressing   Problem: Health Behavior/Discharge Planning: Goal: Ability to manage health-related needs will improve Outcome: Progressing   Problem: Clinical Measurements: Goal: Ability to maintain clinical measurements within normal limits will improve Outcome: Progressing Goal: Will remain free from infection Outcome: Progressing Goal: Diagnostic test results will improve Outcome: Progressing Goal: Respiratory complications will improve Outcome: Progressing Goal: Cardiovascular complication will be avoided Outcome: Progressing

## 2023-04-04 NOTE — Progress Notes (Signed)
 Progress Note    Derek Lynch  OZH:[id number] DOB: 1940/11/01  DOA: 03/26/2023 PCP: Cheryll Dessert, FNP      Brief Narrative:    Medical records reviewed and are as summarized below:  Derek Lynch is a 83 y.o. male  with medical history significant of HTN, HLD, IIDM, chronic ambulatory dysfunction, arthritis, who presented to the hospital with cough, shortness of breath, wheezing, malaise, general weakness and fall.  He felt very weak, unsteady and lightheaded and fell on his right arm.  He lives alone at home.  He was hypoxic with oxygen saturation of 77% on room air and confused when EMS arrived.  He was placed on oxygen via nonrebreather mask.        Assessment/Plan:   Principal Problem:   Hypoxia Active Problems:   Fall   Influenza A with pneumonia   Acute metabolic encephalopathy   CHF (congestive heart failure) (HCC)    Body mass index is 29.86 kg/m.  (Obesity)    Severe sepsis secondary to pneumonia influenza pneumonia and probable aspiration pneumonia: Completed 7 days of IV ceftriaxone and Flagyl on 04/01/2023.  Completed 5 days of Tamiflu on 03/31/2023. Continue droplet precautions   Acute hypoxic respiratory failure: Improved.  He has been weaned off of 4L oxygen and he is tolerating room air.   Previously treated with BiPAP.    Acute metabolic encephalopathy: Improved   Questionable syncope: echo shows EF 55-60%, diastolic function is indeterminate & no regional wall motion abnormalities. Debility: Awaiting placement to SNF.    Rhabdomyolysis: Improved.  CK down to 283. CK was 6,211 on admission.    Elevated liver enzymes: Improved.  Probably from sepsis and rhabdomyolysis.     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  Continue NovoLog correction scale as needed.    AKI: Improved Creatinine was 1.43 on admission    Venous stasis dermatitis bilateral legs: continue on home dose of topical steroid: No  evidence of DVT on venous duplex of the lower extremities.       Diet Order             DIET DYS 3 Room service appropriate? Yes; Fluid consistency: Thin  Diet effective now                            Consultants: None  Procedures: None    Medications:    aspirin EC  81 mg Oral Daily   enoxaparin (LOVENOX) injection  0.5 mg/kg Subcutaneous Q24H   hydrocortisone   Topical BID   insulin aspart  0-5 Units Subcutaneous QHS   insulin aspart  0-9 Units Subcutaneous TID WC   insulin aspart  2 Units Subcutaneous TID WC   lisinopril  2.5 mg Oral Daily   Continuous Infusions:     Anti-infectives (From admission, onward)    Start     Dose/Rate Route Frequency Ordered Stop   03/27/23 1000  oseltamivir (TAMIFLU) capsule 30 mg        30 mg Oral 2 times daily 03/26/23 1719 03/31/23 2237   03/26/23 2200  metroNIDAZOLE (FLAGYL) IVPB 500 mg        500 mg 100 mL/hr over 60 Minutes Intravenous Every 12 hours 03/26/23 1807 04/01/23 2300   03/26/23 1806  cefTRIAXone (ROCEPHIN) 1 g in sodium chloride 0.9 % 100 mL IVPB        1 g 200 mL/hr over 30  Minutes Intravenous Every 24 hours 03/26/23 1807 04/01/23 2037   03/26/23 1430  vancomycin (VANCOCIN) IVPB 1000 mg/200 mL premix       Placed in "Followed by" Linked Group   1,000 mg 200 mL/hr over 60 Minutes Intravenous  Once 03/26/23 1319 03/26/23 2021   03/26/23 1330  vancomycin (VANCOCIN) IVPB 1000 mg/200 mL premix       Placed in "Followed by" Linked Group   1,000 mg 200 mL/hr over 60 Minutes Intravenous  Once 03/26/23 1319 03/26/23 1659   03/26/23 1315  oseltamivir (TAMIFLU) capsule 75 mg        75 mg Oral  Once 03/26/23 1259 03/26/23 1557   03/26/23 1200  ceFEPIme (MAXIPIME) 2 g in sodium chloride 0.9 % 100 mL IVPB        2 g 200 mL/hr over 30 Minutes Intravenous  Once 03/26/23 1147 03/26/23 1321   03/26/23 1200  metroNIDAZOLE (FLAGYL) IVPB 500 mg        500 mg 100 mL/hr over 60 Minutes Intravenous  Once 03/26/23  1147 03/26/23 1525   03/26/23 1200  vancomycin (VANCOCIN) IVPB 1000 mg/200 mL premix  Status:  Discontinued        1,000 mg 200 mL/hr over 60 Minutes Intravenous  Once 03/26/23 1147 03/26/23 1319              Family Communication/Anticipated D/C date and plan/Code Status   DVT prophylaxis:      Code Status: Limited: Do not attempt resuscitation (DNR) -DNR-LIMITED -Do Not Intubate/DNI   Family Communication: None Disposition Plan: Plan to discharge to SNF   Status is: Inpatient Remains inpatient appropriate because: Awaiting placement to SNF      Subjective:   Interval events noted.  He has no complaints.  He was being fed by the nursing assistant  Objective:    Vitals:   04/03/23 2057 04/04/23 0322 04/04/23 0451 04/04/23 0906  BP: 128/88  123/87 (!) 136/90  Pulse: 86  94 88  Resp: 20  18 18   Temp: 98 F (36.7 C)  97.7 F (36.5 C) 98.7 F (37.1 C)  TempSrc: Oral     SpO2: 94%   93%  Weight:  78.9 kg    Height:       No data found.   Intake/Output Summary (Last 24 hours) at 04/04/2023 1127 Last data filed at 04/04/2023 1034 Gross per 24 hour  Intake 120 ml  Output 350 ml  Net -230 ml   Filed Weights   04/01/23 0500 04/02/23 0500 04/04/23 0322  Weight: 78.4 kg 77.5 kg 78.9 kg    Exam:  GEN: NAD SKIN: Warm and dry EYES: No pallor or icterus ENT: MMM CV: RRR PULM: Bibasilar rales, no wheezing or rhonchi ABD: soft, ND, NT, +BS CNS: AAO x 3, non focal EXT: Chronic erythematous changes of bilateral legs      Data Reviewed:   I have personally reviewed following labs and imaging studies:  Labs: Labs show the following:   Basic Metabolic Panel: Recent Labs  Lab 03/29/23 0520 03/30/23 0505 03/31/23 0631 04/02/23 0841  NA 139 139 140 141  K 3.4* 3.4* 3.7 3.6  CL 98 100 102 101  CO2 28 27 28 27   GLUCOSE 142* 146* 148* 238*  BUN 24* 24* 21 32*  CREATININE 1.18 1.07 1.03 1.18  CALCIUM 8.3* 8.3* 8.6* 8.7*   GFR Estimated  Creatinine Clearance: 47.4 mL/min (by C-G formula based on SCr of 1.18 mg/dL). Liver Function Tests:  Recent Labs  Lab 03/29/23 0520 03/30/23 0505 03/31/23 0631 04/02/23 0841  AST 83* 55* 47* 22  ALT 48* 45* 45* 33  ALKPHOS 25* 28* 33* 33*  BILITOT 0.8 0.6 0.6 0.8  PROT 6.3* 6.4* 7.1 7.0  ALBUMIN 2.7* 2.8* 2.9* 2.9*   No results for input(s): "LIPASE", "AMYLASE" in the last 168 hours.  No results for input(s): "AMMONIA" in the last 168 hours. Coagulation profile No results for input(s): "INR", "PROTIME" in the last 168 hours.   CBC: Recent Labs  Lab 03/29/23 0520 03/30/23 0505 03/31/23 0631 04/03/23 0512  WBC 5.0 5.1 7.4 10.2  NEUTROABS  --   --   --  6.6  HGB 10.9* 11.4* 12.3* 12.9*  HCT 31.7* 33.0* 36.1* 38.2*  MCV 91.4 89.9 90.9 89.5  PLT 173 194 231 301   Cardiac Enzymes: Recent Labs  Lab 03/29/23 0520 03/30/23 0505 03/31/23 0631  CKTOTAL 1,781* 643* 283   BNP (last 3 results) No results for input(s): "PROBNP" in the last 8760 hours. CBG: Recent Labs  Lab 04/03/23 0824 04/03/23 1120 04/03/23 1612 04/03/23 2050 04/04/23 0908  GLUCAP 230* 234* 249* 170* 209*   D-Dimer: No results for input(s): "DDIMER" in the last 72 hours. Hgb A1c: No results for input(s): "HGBA1C" in the last 72 hours. Lipid Profile: No results for input(s): "CHOL", "HDL", "LDLCALC", "TRIG", "CHOLHDL", "LDLDIRECT" in the last 72 hours. Thyroid function studies: No results for input(s): "TSH", "T4TOTAL", "T3FREE", "THYROIDAB" in the last 72 hours.  Invalid input(s): "FREET3" Anemia work up: No results for input(s): "VITAMINB12", "FOLATE", "FERRITIN", "TIBC", "IRON", "RETICCTPCT" in the last 72 hours. Sepsis Labs: Recent Labs  Lab 03/29/23 0520 03/30/23 0505 03/31/23 0631 04/03/23 0512  WBC 5.0 5.1 7.4 10.2    Microbiology Recent Results (from the past 240 hours)  Blood Culture (routine x 2)     Status: None   Collection Time: 03/26/23 10:48 AM   Specimen: Right  Antecubital; Blood  Result Value Ref Range Status   Specimen Description RIGHT ANTECUBITAL  Final   Special Requests   Final    Blood Culture results may not be optimal due to an inadequate volume of blood received in culture bottles   Culture   Final    NO GROWTH 5 DAYS Performed at [facility], [address] [place], [place] [address]    Report Status 03/31/2023 FINAL  Final  Blood Culture (routine x 2)     Status: None   Collection Time: 03/26/23 10:48 AM   Specimen: BLOOD  Result Value Ref Range Status   Specimen Description BLOOD BLOOD RIGHT HAND  Final   Special Requests   Final    Blood Culture results may not be optimal due to an inadequate volume of blood received in culture bottles   Culture   Final    NO GROWTH 5 DAYS Performed at [facility], [address] [place], [place] [address]    Report Status 03/31/2023 FINAL  Final  Resp panel by RT-PCR (RSV, Flu A&B, Covid) Anterior Nasal Swab     Status: Abnormal   Collection Time: 03/26/23 10:48 AM   Specimen: Anterior Nasal Swab  Result Value Ref Range Status   SARS Coronavirus 2 by RT PCR NEGATIVE NEGATIVE Final    Comment: (NOTE) SARS-CoV-2 target nucleic acids are NOT DETECTED.  The SARS-CoV-2 RNA is generally detectable in upper respiratory specimens during the acute phase of infection. The lowest concentration of SARS-CoV-2 viral copies this assay can  detect is 138 copies/mL. A negative result does not preclude SARS-Cov-2 infection and should not be used as the sole basis for treatment or other patient management decisions. A negative result may occur with  improper specimen collection/handling, submission of specimen other than nasopharyngeal swab, presence of viral mutation(s) within the areas targeted by this assay, and inadequate number of viral copies(<138 copies/mL). A negative result must be combined with clinical observations, patient history, and epidemiological information. The  expected result is Negative.  Fact Sheet for Patients:  BloggerCourse.com  Fact Sheet for Healthcare Providers:  SeriousBroker.it  This test is no t yet approved or cleared by the [place] FDA and  has been authorized for detection and/or diagnosis of SARS-CoV-2 by FDA under an Emergency Use Authorization (EUA). This EUA will remain  in effect (meaning this test can be used) for the duration of the COVID-19 declaration under Section 564(b)(1) of the Act, 21 U.S.C.section 360bbb-3(b)(1), unless the authorization is terminated  or revoked sooner.       Influenza A by PCR POSITIVE (A) NEGATIVE Final   Influenza B by PCR NEGATIVE NEGATIVE Final    Comment: (NOTE) The Xpert Xpress SARS-CoV-2/FLU/RSV plus assay is intended as an aid in the diagnosis of influenza from Nasopharyngeal swab specimens and should not be used as a sole basis for treatment. Nasal washings and aspirates are unacceptable for Xpert Xpress SARS-CoV-2/FLU/RSV testing.  Fact Sheet for Patients: BloggerCourse.com  Fact Sheet for Healthcare Providers: SeriousBroker.it  This test is not yet approved or cleared by the [place] FDA and has been authorized for detection and/or diagnosis of SARS-CoV-2 by FDA under an Emergency Use Authorization (EUA). This EUA will remain in effect (meaning this test can be used) for the duration of the COVID-19 declaration under Section 564(b)(1) of the Act, 21 U.S.C. section 360bbb-3(b)(1), unless the authorization is terminated or revoked.     Resp Syncytial Virus by PCR NEGATIVE NEGATIVE Final    Comment: (NOTE) Fact Sheet for Patients: BloggerCourse.com  Fact Sheet for Healthcare Providers: SeriousBroker.it  This test is not yet approved or cleared by the [place] FDA and has been authorized for detection and/or  diagnosis of SARS-CoV-2 by FDA under an Emergency Use Authorization (EUA). This EUA will remain in effect (meaning this test can be used) for the duration of the COVID-19 declaration under Section 564(b)(1) of the Act, 21 U.S.C. section 360bbb-3(b)(1), unless the authorization is terminated or revoked.  Performed at [facility], [address] [place], [place] [address]   MRSA Next Gen by PCR, Nasal     Status: None   Collection Time: 03/26/23  3:49 PM   Specimen: Nasal Mucosa; Nasal Swab  Result Value Ref Range Status   MRSA by PCR Next Gen NOT DETECTED NOT DETECTED Final    Comment: (NOTE) The GeneXpert MRSA Assay (FDA approved for NASAL specimens only), is one component of a comprehensive MRSA colonization surveillance program. It is not intended to diagnose MRSA infection nor to guide or monitor treatment for MRSA infections. Test performance is not FDA approved in patients less than 81 years old. Performed at [facility], [address] [place], [place] [address]     Procedures and diagnostic studies:  US Venous Img Lower Unilateral Right (DVT) Result Date: 04/03/2023 CLINICAL DATA:  Right lower extremity edema EXAM: RIGHT LOWER EXTREMITY VENOUS DOPPLER ULTRASOUND TECHNIQUE: Gray-scale sonography with compression, as well as color and duplex ultrasound, were performed to evaluate the deep venous system(s) from  the level of the common femoral vein through the popliteal and proximal calf veins. COMPARISON:  06/08/2016 FINDINGS: VENOUS Normal compressibility of the common femoral, superficial femoral, and popliteal veins, as well as the visualized calf veins. Visualized portions of profunda femoral vein and great saphenous vein unremarkable. No filling defects to suggest DVT on grayscale or color Doppler imaging. Doppler waveforms show normal direction of venous flow, normal respiratory plasticity and response to augmentation. Limited views of the contralateral  common femoral vein are unremarkable. OTHER None. Limitations: none IMPRESSION: 1. No evidence of deep venous thrombosis within the right lower extremity. Electronically Signed   By: Sharlet Salina M.D.   On: 04/03/2023 17:49               LOS: 9 days   Derek Lynch  [facility]   Pager on [email or web address]. If 7PM-7AM, please contact night-coverage at [email or web address]     04/04/2023, 11:27 AM

## 2023-04-05 DIAGNOSIS — J09X1 Influenza due to identified novel influenza A virus with pneumonia: Secondary | ICD-10-CM

## 2023-04-05 DIAGNOSIS — R0902 Hypoxemia: Secondary | ICD-10-CM | POA: Diagnosis not present

## 2023-04-05 LAB — GLUCOSE, CAPILLARY
Glucose-Capillary: 222 mg/dL — ABNORMAL HIGH (ref 70–99)
Glucose-Capillary: 178 mg/dL — ABNORMAL HIGH (ref 70–99)

## 2023-04-05 NOTE — Discharge Summary (Signed)
 Physician Discharge Summary   Patient: Derek Lynch MRN: [id number] DOB: [birth year]  Admit date:     03/26/2023  Discharge date: 04/05/23  Discharge Physician: Lurene Shadow   PCP: Cheryll Dessert, FNP   Recommendations at discharge:   Follow-up with physician at the nursing home within 3 days of discharge  Discharge Diagnoses: Principal Problem:   Hypoxia Active Problems:   Fall   Influenza A with pneumonia   Acute metabolic encephalopathy   CHF (congestive heart failure) (HCC)  Resolved Problems:   * No resolved hospital problems. [facility] Course:  Teller Wakefield Dittman is a 83 y.o. male  with medical history significant of HTN, HLD, IIDM, chronic ambulatory dysfunction, arthritis, who presented to the hospital with cough, shortness of breath, wheezing, malaise, general weakness and fall.  He felt very weak, unsteady and lightheaded and fell on his right arm.  He lives alone at home.  He was hypoxic with oxygen saturation of 77% on room air and confused when EMS arrived.  He was placed on oxygen via nonrebreather mask.   Assessment and Plan:  Severe sepsis secondary to pneumonia, influenza pneumonia and probable aspiration pneumonia: Completed 7 days of IV ceftriaxone and Flagyl on 04/01/2023.  Completed 5 days of Tamiflu on 03/31/2023. Robitussin as needed for cough which may linger for a while.     Acute hypoxic respiratory failure: Improved.  He has been weaned off of 4L oxygen and he is tolerating room air.   Previously treated with BiPAP.     Acute metabolic encephalopathy: Improved     Questionable syncope: echo shows EF 55-60%, diastolic function is indeterminate & no regional wall motion abnormalities. Debility: PT recommended discharge to SNF     Rhabdomyolysis: Improved.  CK down to 283. CK was 6,211 on admission.     Elevated liver enzymes: Improved.  Probably from sepsis and rhabdomyolysis.       Hypokalemia: Improved     DM2: likely poorly controlled,  HbA1c 8.5.  Resume metformin and pioglitazone at discharge     AKI: Improved Creatinine was 1.43 on admission     Venous stasis dermatitis bilateral legs: No evidence of DVT on venous duplex of the lower extremities.   His condition has improved and he is deemed stable for discharge to SNF today.        Consultants: None Procedures performed: None Disposition: Skilled nursing facility Diet recommendation:  Discharge Diet Orders (From admission, onward)     Start     Ordered   04/05/23 0000  DIET DYS 3       Question:  Fluid consistency:  Answer:  Thin   04/05/23 1411           Dysphagia type 3 thin Liquid DISCHARGE MEDICATION: Allergies as of 04/05/2023       Reactions   Penicillin G Other (See Comments)        Medication List     STOP taking these medications    clobetasol cream 0.05 % Commonly known as: TEMOVATE       TAKE these medications    acetaminophen 325 MG tablet Commonly known as: TYLENOL Take 650 mg by mouth every 6 (six) hours as needed for mild pain, moderate pain or headache.   aspirin EC 81 MG tablet Take 81 mg by mouth daily.   cholecalciferol 1000 units tablet Commonly known as: VITAMIN D Take 1,000 Units by mouth daily.   furosemide 20 MG tablet Commonly known as: LASIX Take  1 tablet (20 mg total) by mouth daily.   lisinopril 2.5 MG tablet Commonly known as: ZESTRIL Take 2.5 mg by mouth daily.   lovastatin 20 MG tablet Commonly known as: MEVACOR Take 20 mg by mouth daily at 6 PM.   metFORMIN 1000 MG tablet Commonly known as: GLUCOPHAGE Take 1 tablet by mouth 2 (two) times daily with a meal. What changed: Another medication with the same name was removed. Continue taking this medication, and follow the directions you see here.   pioglitazone 15 MG tablet Commonly known as: ACTOS Take 15 mg by mouth daily.   vitamin C 1000 MG tablet Take 1,000 mg by mouth daily.        Follow-up Information     Cheryll Dessert, FNP Follow up.   Specialty: Family Medicine Why: Hospital follow up Contact information: [address] [place] [facility] [address] [phone]                Discharge Exam: Filed Weights   04/02/23 0500 04/04/23 0322 04/05/23 0518  Weight: 77.5 kg 78.9 kg 77.1 kg   GEN: NAD SKIN: Warm and dry EYES: No pallor or icterus ENT: MMM CV: RRR PULM: Mild bibasilar rales, no wheezing ABD: soft, ND, NT, +BS CNS: Alert and oriented to person, place and situation.  Non focal EXT: No edema or tenderness    Condition at discharge: stable  The results of significant diagnostics from this hospitalization (including imaging, microbiology, ancillary and laboratory) are listed below for reference.   Imaging Studies: US Venous Img Lower Unilateral Right (DVT) Result Date: 04/03/2023 CLINICAL DATA:  Right lower extremity edema EXAM: RIGHT LOWER EXTREMITY VENOUS DOPPLER ULTRASOUND TECHNIQUE: Gray-scale sonography with compression, as well as color and duplex ultrasound, were performed to evaluate the deep venous system(s) from the level of the common femoral vein through the popliteal and proximal calf veins. COMPARISON:  06/08/2016 FINDINGS: VENOUS Normal compressibility of the common femoral, superficial femoral, and popliteal veins, as well as the visualized calf veins. Visualized portions of profunda femoral vein and great saphenous vein unremarkable. No filling defects to suggest DVT on grayscale or color Doppler imaging. Doppler waveforms show normal direction of venous flow, normal respiratory plasticity and response to augmentation. Limited views of the contralateral common femoral vein are unremarkable. OTHER None. Limitations: none IMPRESSION: 1. No evidence of deep venous thrombosis within the right lower extremity. Electronically Signed   By: Sharlet Salina M.D.   On: 04/03/2023 17:49   ECHOCARDIOGRAM COMPLETE Result Date: 03/27/2023    ECHOCARDIOGRAM  REPORT   Patient Name:   Derek Lynch Date of Exam: 03/27/2023 Medical Rec #:  [id number]     Height:       64.0 in Accession #:    [id number]    Weight:       194.9 lb Date of Birth:  Aug 12, 1940    BSA:          1.935 m Patient Age:    82 years      BP:           152/92 mmHg Patient Gender: M             HR:           90 bpm. Exam Location:  [facility] Procedure: 2D Echo, Cardiac Doppler and Color Doppler (Both Spectral and Color            Flow Doppler were utilized during procedure). Indications:  CHF I50.31  History:         Patient has no prior history of Echocardiogram examinations.  Sonographer:     Overton Mam RDCS, FASE Referring Phys:  [id number] [place] General Diagnosing Phys: Julien Nordmann MD  Sonographer Comments: Technically difficult study due to poor echo windows and no subcostal window. Image acquisition challenging due to patient body habitus and Image acquisition challenging due to respiratory motion. IMPRESSIONS  1. Left ventricular ejection fraction, by estimation, is 55 to 60%. Left ventricular ejection fraction by PLAX is 56 %. The left ventricle has normal function. The left ventricle has no regional wall motion abnormalities. There is mild left ventricular hypertrophy. Left ventricular diastolic parameters are indeterminate.  2. Right ventricular systolic function is normal. The right ventricular size is normal.  3. The mitral valve is normal in structure. No evidence of mitral valve regurgitation. No evidence of mitral stenosis.  4. The aortic valve is normal in structure. There is mild calcification of the aortic valve. Aortic valve regurgitation is not visualized. Aortic valve sclerosis is present, with no evidence of aortic valve stenosis.  5. The inferior vena cava is normal in size with greater than 50% respiratory variability, suggesting right atrial pressure of 3 mmHg. FINDINGS  Left Ventricle: Left ventricular ejection fraction, by estimation, is 55 to 60%. Left ventricular ejection  fraction by PLAX is 56 %. The left ventricle has normal function. The left ventricle has no regional wall motion abnormalities. Global longitudinal strain performed but not reported based on interpreter judgement due to suboptimal tracking. The left ventricular internal cavity size was normal in size. There is mild left ventricular hypertrophy. Left ventricular diastolic parameters are indeterminate. Right Ventricle: The right ventricular size is normal. No increase in right ventricular wall thickness. Right ventricular systolic function is normal. Left Atrium: Left atrial size was normal in size. Right Atrium: Right atrial size was normal in size. Pericardium: There is no evidence of pericardial effusion. Mitral Valve: The mitral valve is normal in structure. No evidence of mitral valve regurgitation. No evidence of mitral valve stenosis. Tricuspid Valve: The tricuspid valve is normal in structure. Tricuspid valve regurgitation is not demonstrated. No evidence of tricuspid stenosis. Aortic Valve: The aortic valve is normal in structure. There is mild calcification of the aortic valve. Aortic valve regurgitation is not visualized. Aortic valve sclerosis is present, with no evidence of aortic valve stenosis. Aortic valve peak gradient  measures 13.8 mmHg. Pulmonic Valve: The pulmonic valve was normal in structure. Pulmonic valve regurgitation is not visualized. No evidence of pulmonic stenosis. Aorta: The aortic root is normal in size and structure. Venous: The inferior vena cava is normal in size with greater than 50% respiratory variability, suggesting right atrial pressure of 3 mmHg. IAS/Shunts: No atrial level shunt detected by color flow Doppler. Additional Comments: 3D was performed not requiring image post processing on an independent workstation and was indeterminate.  LEFT VENTRICLE PLAX 2D LV EF:         Left            Diastology                ventricular     LV e' medial:    9.46 cm/s                 ejection        LV E/e' medial:  11.7  fraction by     LV e' lateral:   12.60 cm/s                PLAX is 56      LV E/e' lateral: 8.8                %. LVIDd:         3.20 cm LVIDs:         2.30 cm LV PW:         1.30 cm LV IVS:        1.30 cm LVOT diam:     2.00 cm LV SV:         73 LV SV Index:   38 LVOT Area:     3.14 cm  RIGHT VENTRICLE RV Basal diam:  3.50 cm RV S prime:     15.60 cm/s TAPSE (M-mode): 1.9 cm LEFT ATRIUM             Index        RIGHT ATRIUM           Index LA diam:        2.90 cm 1.50 cm/m   RA Area:     18.70 cm LA Vol (A2C):   33.2 ml 17.16 ml/m  RA Volume:   50.90 ml  26.31 ml/m LA Vol (A4C):   25.5 ml 13.18 ml/m LA Biplane Vol: 32.4 ml 16.74 ml/m  AORTIC VALVE                 PULMONIC VALVE AV Area (Vmax): 1.89 cm     PV Vmax:       1.28 m/s AV Vmax:        186.00 cm/s  PV Peak grad:  6.6 mmHg AV Peak Grad:   13.8 mmHg LVOT Vmax:      112.00 cm/s LVOT Vmean:     74.200 cm/s LVOT VTI:       0.231 m  AORTA Ao Root diam: 3.60 cm Ao Asc diam:  3.50 cm MITRAL VALVE MV Area (PHT): 3.77 cm     SHUNTS MV Decel Time: 201 msec     Systemic VTI:  0.23 m MV E velocity: 111.00 cm/s  Systemic Diam: 2.00 cm MV A velocity: 93.80 cm/s MV E/A ratio:  1.18 Julien Nordmann MD Electronically signed by Julien Nordmann MD Signature Date/Time: 03/27/2023/11:52:05 AM    Final    DG Chest 1 View Result Date: 03/27/2023 CLINICAL DATA:  CHF. EXAM: CHEST  1 VIEW COMPARISON:  03/26/2023 FINDINGS: The cardio pericardial silhouette is enlarged. Lung apices obscured by the patient's lower face. There is pulmonary vascular congestion without overt pulmonary edema. Basilar atelectasis with small right pleural effusion. Bones are demineralized. Telemetry leads overlie the chest. IMPRESSION: Cardiomegaly with vascular congestion and small right pleural effusion. Electronically Signed   By: Kennith Center M.D.   On: 03/27/2023 06:57   DG Chest 1 View Result Date: 03/26/2023 CLINICAL DATA:  Congestive  heart failure.  Fall. EXAM: CHEST  1 VIEW COMPARISON:  Radiograph earlier today, chest CT earlier today FINDINGS: The heart is enlarged but stable. Aortic atherosclerosis. Pleural effusions are better demonstrated on CT. No pneumothorax or acute airspace disease. No displaced rib fractures. IMPRESSION: Cardiomegaly. Pleural effusions are better demonstrated on CT. Electronically Signed   By: Narda Rutherford M.D.   On: 03/26/2023 23:37   CT Angio Chest Pulmonary Embolism (PE) W or WO Contrast Result Date: 03/26/2023 CLINICAL DATA:  Sepsis, unwitnessed fall altered mental status EXAM: CT ANGIOGRAPHY CHEST CT ABDOMEN AND PELVIS WITH CONTRAST TECHNIQUE: Multidetector CT imaging of the chest was performed using the standard protocol during bolus administration of intravenous contrast. Multiplanar CT image reconstructions and MIPs were obtained to evaluate the vascular anatomy. Multidetector CT imaging of the abdomen and pelvis was performed using the standard protocol during bolus administration of intravenous contrast. RADIATION DOSE REDUCTION: This exam was performed according to the departmental dose-optimization program which includes automated exposure control, adjustment of the mA and/or kV according to patient size and/or use of iterative reconstruction technique. CONTRAST:  80mL OMNIPAQUE IOHEXOL 350 MG/ML SOLN COMPARISON:  None Available. FINDINGS: CT CHEST ANGIOGRAM FINDINGS Cardiovascular: Satisfactory opacification of the pulmonary arteries to the segmental level. No evidence of pulmonary embolism. Cardiomegaly. Three-vessel coronary artery calcifications enlargement of the main pulmonary artery measuring up to 3.5 cm in caliber. No pericardial effusion. Aortic atherosclerosis. Mediastinum/Nodes: No enlarged mediastinal, hilar, or axillary lymph nodes. Thyroid gland, trachea, and esophagus demonstrate no significant findings. Lungs/Pleura: Small bilateral pleural effusions. Diffuse bilateral bronchial wall  thickening. Scattered heterogeneous and ground-glass airspace opacities throughout the left upper lobe (series 5, image 38). Dependent bibasilar scarring and atelectasis, particularly of the posterior lingula and lateral segment right middle lobe (series 5, image 86). Musculoskeletal: No chest wall abnormality. No acute osseous findings. Review of the MIP images confirms the above findings. CT ABDOMEN PELVIS FINDINGS Hepatobiliary: No solid liver abnormality is seen. Hepatic steatosis. No gallstones, gallbladder wall thickening, or biliary dilatation. Pancreas: Unremarkable. No pancreatic ductal dilatation or surrounding inflammatory changes. Spleen: Normal in size without significant abnormality. Adrenals/Urinary Tract: Adrenal glands are unremarkable. Nonobstructive inferior pole right renal calculus. Multiple simple, benign bilateral renal cortical cysts, for which no further follow-up or characterization is required. Bladder is unremarkable. Stomach/Bowel: Stomach is within normal limits. Appendix appears normal. No evidence of bowel wall thickening, distention, or inflammatory changes. Sigmoid diverticulosis. Vascular/Lymphatic: Aortic atherosclerosis. No enlarged abdominal or pelvic lymph nodes. Reproductive: No mass or other significant abnormality. Other: Large right, small left fat containing inguinal hernias. No ascites. Musculoskeletal: No acute or significant osseous findings. IMPRESSION: 1. Negative examination for pulmonary embolism. 2. Diffuse bilateral bronchial wall thickening and scattered heterogeneous and ground-glass airspace opacities throughout the left upper lobe, consistent with infection or aspiration. 3. Small bilateral pleural effusions. Dependent bibasilar scarring and atelectasis. 4. Cardiomegaly and coronary artery disease. 5. Enlargement of the main pulmonary artery, as can be seen in pulmonary hypertension. 6. No acute CT findings of the abdomen or pelvis. 7. Hepatic steatosis. 8.  Nonobstructive right nephrolithiasis. Aortic Atherosclerosis (ICD10-I70.0). Electronically Signed   By: Jearld Lesch M.D.   On: 03/26/2023 17:20   CT ABDOMEN PELVIS W CONTRAST Result Date: 03/26/2023 CLINICAL DATA:  Sepsis, unwitnessed fall altered mental status EXAM: CT ANGIOGRAPHY CHEST CT ABDOMEN AND PELVIS WITH CONTRAST TECHNIQUE: Multidetector CT imaging of the chest was performed using the standard protocol during bolus administration of intravenous contrast. Multiplanar CT image reconstructions and MIPs were obtained to evaluate the vascular anatomy. Multidetector CT imaging of the abdomen and pelvis was performed using the standard protocol during bolus administration of intravenous contrast. RADIATION DOSE REDUCTION: This exam was performed according to the departmental dose-optimization program which includes automated exposure control, adjustment of the mA and/or kV according to patient size and/or use of iterative reconstruction technique. CONTRAST:  80mL OMNIPAQUE IOHEXOL 350 MG/ML SOLN COMPARISON:  None Available. FINDINGS: CT CHEST ANGIOGRAM FINDINGS Cardiovascular: Satisfactory opacification of the pulmonary arteries to  the segmental level. No evidence of pulmonary embolism. Cardiomegaly. Three-vessel coronary artery calcifications enlargement of the main pulmonary artery measuring up to 3.5 cm in caliber. No pericardial effusion. Aortic atherosclerosis. Mediastinum/Nodes: No enlarged mediastinal, hilar, or axillary lymph nodes. Thyroid gland, trachea, and esophagus demonstrate no significant findings. Lungs/Pleura: Small bilateral pleural effusions. Diffuse bilateral bronchial wall thickening. Scattered heterogeneous and ground-glass airspace opacities throughout the left upper lobe (series 5, image 38). Dependent bibasilar scarring and atelectasis, particularly of the posterior lingula and lateral segment right middle lobe (series 5, image 86). Musculoskeletal: No chest wall abnormality. No  acute osseous findings. Review of the MIP images confirms the above findings. CT ABDOMEN PELVIS FINDINGS Hepatobiliary: No solid liver abnormality is seen. Hepatic steatosis. No gallstones, gallbladder wall thickening, or biliary dilatation. Pancreas: Unremarkable. No pancreatic ductal dilatation or surrounding inflammatory changes. Spleen: Normal in size without significant abnormality. Adrenals/Urinary Tract: Adrenal glands are unremarkable. Nonobstructive inferior pole right renal calculus. Multiple simple, benign bilateral renal cortical cysts, for which no further follow-up or characterization is required. Bladder is unremarkable. Stomach/Bowel: Stomach is within normal limits. Appendix appears normal. No evidence of bowel wall thickening, distention, or inflammatory changes. Sigmoid diverticulosis. Vascular/Lymphatic: Aortic atherosclerosis. No enlarged abdominal or pelvic lymph nodes. Reproductive: No mass or other significant abnormality. Other: Large right, small left fat containing inguinal hernias. No ascites. Musculoskeletal: No acute or significant osseous findings. IMPRESSION: 1. Negative examination for pulmonary embolism. 2. Diffuse bilateral bronchial wall thickening and scattered heterogeneous and ground-glass airspace opacities throughout the left upper lobe, consistent with infection or aspiration. 3. Small bilateral pleural effusions. Dependent bibasilar scarring and atelectasis. 4. Cardiomegaly and coronary artery disease. 5. Enlargement of the main pulmonary artery, as can be seen in pulmonary hypertension. 6. No acute CT findings of the abdomen or pelvis. 7. Hepatic steatosis. 8. Nonobstructive right nephrolithiasis. Aortic Atherosclerosis (ICD10-I70.0). Electronically Signed   By: Jearld Lesch M.D.   On: 03/26/2023 17:20   CT Head Wo Contrast Result Date: 03/26/2023 CLINICAL DATA:  Unwitnessed fall, unknown injury EXAM: CT HEAD WITHOUT CONTRAST CT MAXILLOFACIAL WITHOUT CONTRAST CT  CERVICAL SPINE WITHOUT CONTRAST TECHNIQUE: Multidetector CT imaging of the head, cervical spine, and maxillofacial structures were performed using the standard protocol without intravenous contrast. Multiplanar CT image reconstructions of the cervical spine and maxillofacial structures were also generated. RADIATION DOSE REDUCTION: This exam was performed according to the departmental dose-optimization program which includes automated exposure control, adjustment of the mA and/or kV according to patient size and/or use of iterative reconstruction technique. COMPARISON:  11/20/2021 FINDINGS: CT HEAD FINDINGS Brain: No evidence of acute infarction, hemorrhage, hydrocephalus, extra-axial collection or mass lesion/mass effect. Periventricular white matter hypodensity. Unchanged bifrontal encephalomalacia (series 2, image 24). Vascular: No hyperdense vessel or unexpected calcification. CT FACIAL BONES FINDINGS Skull: Normal. Negative for fracture or focal lesion. Facial bones: No displaced fractures or dislocations. Sinuses/Orbits: No acute finding. Other: Soft tissue contusion and hematoma overlying the left brow, superficial orbit, left calvarium, and left skull base (series 2, image 20 13 7). Patient is edentulous. CT CERVICAL SPINE FINDINGS Alignment: Normal. Skull base and vertebrae: No acute fracture. No primary bone lesion or focal pathologic process. Soft tissues and spinal canal: No prevertebral fluid or swelling. No visible canal hematoma. Disc levels: Moderate multilevel cervical disc degenerative disease. Upper chest: Negative. Other: None. IMPRESSION: 1. No acute intracranial pathology. 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 2. No displaced fractures or dislocations of the facial bones. 3. Soft tissue contusion and hematoma overlying  the left brow, superficial orbit, left calvarium, and left skull base. No underlying fracture. 4. No fracture or static subluxation of the  cervical spine. 5. Moderate multilevel cervical disc degenerative disease. Electronically Signed   By: Jearld Lesch M.D.   On: 03/26/2023 17:11   CT CERVICAL SPINE WO CONTRAST Result Date: 03/26/2023 CLINICAL DATA:  Unwitnessed fall, unknown injury EXAM: CT HEAD WITHOUT CONTRAST CT MAXILLOFACIAL WITHOUT CONTRAST CT CERVICAL SPINE WITHOUT CONTRAST TECHNIQUE: Multidetector CT imaging of the head, cervical spine, and maxillofacial structures were performed using the standard protocol without intravenous contrast. Multiplanar CT image reconstructions of the cervical spine and maxillofacial structures were also generated. RADIATION DOSE REDUCTION: This exam was performed according to the departmental dose-optimization program which includes automated exposure control, adjustment of the mA and/or kV according to patient size and/or use of iterative reconstruction technique. COMPARISON:  11/20/2021 FINDINGS: CT HEAD FINDINGS Brain: No evidence of acute infarction, hemorrhage, hydrocephalus, extra-axial collection or mass lesion/mass effect. Periventricular white matter hypodensity. Unchanged bifrontal encephalomalacia (series 2, image 24). Vascular: No hyperdense vessel or unexpected calcification. CT FACIAL BONES FINDINGS Skull: Normal. Negative for fracture or focal lesion. Facial bones: No displaced fractures or dislocations. Sinuses/Orbits: No acute finding. Other: Soft tissue contusion and hematoma overlying the left brow, superficial orbit, left calvarium, and left skull base (series 2, image 20 13 7). Patient is edentulous. CT CERVICAL SPINE FINDINGS Alignment: Normal. Skull base and vertebrae: No acute fracture. No primary bone lesion or focal pathologic process. Soft tissues and spinal canal: No prevertebral fluid or swelling. No visible canal hematoma. Disc levels: Moderate multilevel cervical disc degenerative disease. Upper chest: Negative. Other: None. IMPRESSION: 1. No acute intracranial pathology.  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 2. No displaced fractures or dislocations of the facial bones. 3. Soft tissue contusion and hematoma overlying the left brow, superficial orbit, left calvarium, and left skull base. No underlying fracture. 4. No fracture or static subluxation of the cervical spine. 5. Moderate multilevel cervical disc degenerative disease. Electronically Signed   By: Jearld Lesch M.D.   On: 03/26/2023 17:11   CT Maxillofacial Wo Contrast Result Date: 03/26/2023 CLINICAL DATA:  Unwitnessed fall, unknown injury EXAM: CT HEAD WITHOUT CONTRAST CT MAXILLOFACIAL WITHOUT CONTRAST CT CERVICAL SPINE WITHOUT CONTRAST TECHNIQUE: Multidetector CT imaging of the head, cervical spine, and maxillofacial structures were performed using the standard protocol without intravenous contrast. Multiplanar CT image reconstructions of the cervical spine and maxillofacial structures were also generated. RADIATION DOSE REDUCTION: This exam was performed according to the departmental dose-optimization program which includes automated exposure control, adjustment of the mA and/or kV according to patient size and/or use of iterative reconstruction technique. COMPARISON:  11/20/2021 FINDINGS: CT HEAD FINDINGS Brain: No evidence of acute infarction, hemorrhage, hydrocephalus, extra-axial collection or mass lesion/mass effect. Periventricular white matter hypodensity. Unchanged bifrontal encephalomalacia (series 2, image 24). Vascular: No hyperdense vessel or unexpected calcification. CT FACIAL BONES FINDINGS Skull: Normal. Negative for fracture or focal lesion. Facial bones: No displaced fractures or dislocations. Sinuses/Orbits: No acute finding. Other: Soft tissue contusion and hematoma overlying the left brow, superficial orbit, left calvarium, and left skull base (series 2, image 20 13 7). Patient is edentulous. CT CERVICAL SPINE FINDINGS Alignment: Normal. Skull base and vertebrae:  No acute fracture. No primary bone lesion or focal pathologic process. Soft tissues and spinal canal: No prevertebral fluid or swelling. No visible canal hematoma. Disc levels: Moderate multilevel cervical disc degenerative disease. Upper chest: Negative. Other: None. IMPRESSION: 1. No  acute intracranial pathology. 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 2. No displaced fractures or dislocations of the facial bones. 3. Soft tissue contusion and hematoma overlying the left brow, superficial orbit, left calvarium, and left skull base. No underlying fracture. 4. No fracture or static subluxation of the cervical spine. 5. Moderate multilevel cervical disc degenerative disease. Electronically Signed   By: Jearld Lesch M.D.   On: 03/26/2023 17:11   US Venous Img Upper Uni Left Result Date: 03/26/2023 CLINICAL DATA:  swelling, eval for dvt EXAM: LEFT UPPER EXTREMITY VENOUS DOPPLER ULTRASOUND TECHNIQUE: Gray-scale sonography with graded compression, as well as color Doppler and duplex ultrasound were performed to evaluate the upper extremity deep venous system from the level of the subclavian vein and including the jugular, axillary, basilic, radial, ulnar and upper cephalic vein. Spectral Doppler was utilized to evaluate flow at rest and with distal augmentation maneuvers. COMPARISON:  CTA chest 03/26/2023 FINDINGS: VENOUS Normal compressibility of the LEFT internal jugular, subclavian, axillary, cephalic, basilic, brachial, radial and ulnar veins. No filling defects to suggest DVT on grayscale or color Doppler imaging. Doppler waveforms show normal direction of venous flow, normal respiratory plasticity and response to augmentation. Limited views of the contralateral subclavian vein are unremarkable. OTHER No evidence of superficial thrombophlebitis or abnormal fluid collection. Limitations: none IMPRESSION: No evidence of DVT or superficial thrombophlebitis within the LEFT upper  extremity. Roanna Banning, MD [facility] Electronically Signed   By: Roanna Banning M.D.   On: 03/26/2023 16:00   DG Shoulder Left Result Date: 03/26/2023 CLINICAL DATA:  Left arm pain status post fall EXAM: LEFT HUMERUS - 2 VIEW; LEFT SHOULDER - 3 VIEW COMPARISON:  None Available. FINDINGS: There is no evidence of fracture or dislocation. Degenerative changes of the shoulder. Soft tissues are unremarkable. IMPRESSION: No acute fracture or dislocation. Degenerative changes of the shoulder. Electronically Signed   By: Agustin Cree M.D.   On: 03/26/2023 13:51   DG Humerus Left Result Date: 03/26/2023 CLINICAL DATA:  Left arm pain status post fall EXAM: LEFT HUMERUS - 2 VIEW; LEFT SHOULDER - 3 VIEW COMPARISON:  None Available. FINDINGS: There is no evidence of fracture or dislocation. Degenerative changes of the shoulder. Soft tissues are unremarkable. IMPRESSION: No acute fracture or dislocation. Degenerative changes of the shoulder. Electronically Signed   By: Agustin Cree M.D.   On: 03/26/2023 13:51   DG Chest Port 1 View Result Date: 03/26/2023 CLINICAL DATA:  Unwitnessed fall with right arm pain. Hypoxia and new altered mental status EXAM: PORTABLE CHEST 1 VIEW COMPARISON:  Chest radiograph dated 11/20/2021 FINDINGS: Low lung volumes with bronchovascular crowding. Diffuse bilateral interstitial and patchy opacities. Blunting of the bilateral costophrenic angles. No pneumothorax. Similar enlarged cardiomediastinal silhouette. No radiographic finding of acute displaced fracture. IMPRESSION: 1. Diffuse bilateral interstitial and patchy opacities, which may represent atelectasis, pulmonary edema, or multifocal infection. 2. Blunting of the bilateral costophrenic angles, which may represent small pleural effusions. Electronically Signed   By: Agustin Cree M.D.   On: 03/26/2023 13:36    Microbiology: Results for orders placed or performed during the hospital  encounter of 03/26/23  Blood Culture (routine x 2)     Status: None   Collection Time: 03/26/23 10:48 AM   Specimen: Right Antecubital; Blood  Result Value Ref Range Status   Specimen Description RIGHT ANTECUBITAL  Final   Special Requests   Final    Blood Culture results may not be optimal due  to an inadequate volume of blood received in culture bottles   Culture   Final    NO GROWTH 5 DAYS Performed at [facility], [address] [place], [place] [address]    Report Status 03/31/2023 FINAL  Final  Blood Culture (routine x 2)     Status: None   Collection Time: 03/26/23 10:48 AM   Specimen: BLOOD  Result Value Ref Range Status   Specimen Description BLOOD BLOOD RIGHT HAND  Final   Special Requests   Final    Blood Culture results may not be optimal due to an inadequate volume of blood received in culture bottles   Culture   Final    NO GROWTH 5 DAYS Performed at [facility], [address] [place], [place] [address]    Report Status 03/31/2023 FINAL  Final  Resp panel by RT-PCR (RSV, Flu A&B, Covid) Anterior Nasal Swab     Status: Abnormal   Collection Time: 03/26/23 10:48 AM   Specimen: Anterior Nasal Swab  Result Value Ref Range Status   SARS Coronavirus 2 by RT PCR NEGATIVE NEGATIVE Final    Comment: (NOTE) SARS-CoV-2 target nucleic acids are NOT DETECTED.  The SARS-CoV-2 RNA is generally detectable in upper respiratory specimens during the acute phase of infection. The lowest concentration of SARS-CoV-2 viral copies this assay can detect is 138 copies/mL. A negative result does not preclude SARS-Cov-2 infection and should not be used as the sole basis for treatment or other patient management decisions. A negative result may occur with  improper specimen collection/handling, submission of specimen other than nasopharyngeal swab, presence of viral mutation(s) within the areas targeted by this assay, and inadequate number of viral copies(<138  copies/mL). A negative result must be combined with clinical observations, patient history, and epidemiological information. The expected result is Negative.  Fact Sheet for Patients:  BloggerCourse.com  Fact Sheet for Healthcare Providers:  SeriousBroker.it  This test is no t yet approved or cleared by the [place] FDA and  has been authorized for detection and/or diagnosis of SARS-CoV-2 by FDA under an Emergency Use Authorization (EUA). This EUA will remain  in effect (meaning this test can be used) for the duration of the COVID-19 declaration under Section 564(b)(1) of the Act, 21 U.S.C.section 360bbb-3(b)(1), unless the authorization is terminated  or revoked sooner.       Influenza A by PCR POSITIVE (A) NEGATIVE Final   Influenza B by PCR NEGATIVE NEGATIVE Final    Comment: (NOTE) The Xpert Xpress SARS-CoV-2/FLU/RSV plus assay is intended as an aid in the diagnosis of influenza from Nasopharyngeal swab specimens and should not be used as a sole basis for treatment. Nasal washings and aspirates are unacceptable for Xpert Xpress SARS-CoV-2/FLU/RSV testing.  Fact Sheet for Patients: BloggerCourse.com  Fact Sheet for Healthcare Providers: SeriousBroker.it  This test is not yet approved or cleared by the [place] FDA and has been authorized for detection and/or diagnosis of SARS-CoV-2 by FDA under an Emergency Use Authorization (EUA). This EUA will remain in effect (meaning this test can be used) for the duration of the COVID-19 declaration under Section 564(b)(1) of the Act, 21 U.S.C. section 360bbb-3(b)(1), unless the authorization is terminated or revoked.     Resp Syncytial Virus by PCR NEGATIVE NEGATIVE Final    Comment: (NOTE) Fact Sheet for Patients: BloggerCourse.com  Fact Sheet for Healthcare  Providers: SeriousBroker.it  This test is not yet approved or cleared by the [place] FDA and has been  authorized for detection and/or diagnosis of SARS-CoV-2 by FDA under an Emergency Use Authorization (EUA). This EUA will remain in effect (meaning this test can be used) for the duration of the COVID-19 declaration under Section 564(b)(1) of the Act, 21 U.S.C. section 360bbb-3(b)(1), unless the authorization is terminated or revoked.  Performed at [facility], [address] [place], [place] [address]   MRSA Next Gen by PCR, Nasal     Status: None   Collection Time: 03/26/23  3:49 PM   Specimen: Nasal Mucosa; Nasal Swab  Result Value Ref Range Status   MRSA by PCR Next Gen NOT DETECTED NOT DETECTED Final    Comment: (NOTE) The GeneXpert MRSA Assay (FDA approved for NASAL specimens only), is one component of a comprehensive MRSA colonization surveillance program. It is not intended to diagnose MRSA infection nor to guide or monitor treatment for MRSA infections. Test performance is not FDA approved in patients less than 81 years old. Performed at [facility], [address] [place], [place] [address]     Labs: CBC: Recent Labs  Lab 03/30/23 0505 03/31/23 0631 04/03/23 0512  WBC 5.1 7.4 10.2  NEUTROABS  --   --  6.6  HGB 11.4* 12.3* 12.9*  HCT 33.0* 36.1* 38.2*  MCV 89.9 90.9 89.5  PLT 194 231 301   Basic Metabolic Panel: Recent Labs  Lab 03/30/23 0505 03/31/23 0631 04/02/23 0841  NA 139 140 141  K 3.4* 3.7 3.6  CL 100 102 101  CO2 27 28 27   GLUCOSE 146* 148* 238*  BUN 24* 21 32*  CREATININE 1.07 1.03 1.18  CALCIUM 8.3* 8.6* 8.7*   Liver Function Tests: Recent Labs  Lab 03/30/23 0505 03/31/23 0631 04/02/23 0841  AST 55* 47* 22  ALT 45* 45* 33  ALKPHOS 28* 33* 33*  BILITOT 0.6 0.6 0.8  PROT 6.4* 7.1 7.0  ALBUMIN 2.8* 2.9* 2.9*   CBG: Recent Labs  Lab 04/04/23 1718 04/04/23 2105  04/04/23 2257 04/05/23 0725 04/05/23 1146  GLUCAP 178* 207* 197* 222* 178*    Discharge time spent: greater than 30 minutes.  Signed: Lurene Shadow, MD [facility] 04/05/2023

## 2023-04-05 NOTE — TOC Progression Note (Signed)
 Transition of Care [facility]) - Progression Note    Patient Details  Name: Derek Lynch MRN: [id number] Date of Birth: Dec 10, 1940  Transition of Care [facility] CM/SW Contact  Chapman Fitch, RN Phone Number: 04/05/2023, 12:18 PM  Clinical Narrative:        [facility]erkley Harvey approved for [facility].  Auth ID [id number] Valid through 3/18  Message sent to MD to determine if patient medically appropriate for discharge    Expected Discharge Plan and Services                                               Social Determinants of Health (SDOH) Interventions SDOH Screenings   Food Insecurity: No Food Insecurity (03/26/2023)  Housing: Low Risk  (03/26/2023)  Transportation Needs: No Transportation Needs (03/26/2023)  Utilities: Not At Risk (03/26/2023)  Financial Resource Strain: Low Risk  (10/29/2022)   Received from [facility]  Social Connections: Unknown (03/26/2023)  Tobacco Use: Medium Risk (03/26/2023)    Readmission Risk Interventions     No data to display

## 2023-04-05 NOTE — Plan of Care (Signed)

## 2023-04-05 NOTE — TOC Transition Note (Signed)
 Transition of Care [facility]) - Discharge Note   Patient Details  Name: [name] MRN: [id number] Date of Birth: 08/28/1940  Transition of Care [facility]) CM/SW Contact:  Chapman Fitch, RN Phone Number: 04/05/2023, 3:07 PM   Clinical Narrative:        Patient will DC to: [facility] Anticipated DC date:04/05/23   Family notified: VM left for friend [name] Transport [facility]  MD confirms signed DNR on chart Per MD patient ready for DC to . RN, patient, patient's family, and facility notified of DC. Discharge Summary sent to facility. RN given number for report. DC packet on chart. Ambulance transport requested for patient.  TOC signing off.      Patient Goals and CMS Choice            Discharge Placement                       Discharge Plan and Services Additional resources added to the After Visit Summary for                                       Social Drivers of Health (SDOH) Interventions SDOH Screenings   Food Insecurity: No Food Insecurity (03/26/2023)  Housing: Low Risk  (03/26/2023)  Transportation Needs: No Transportation Needs (03/26/2023)  Utilities: Not At Risk (03/26/2023)  Financial Resource Strain: Low Risk  (10/29/2022)   Received from [facility]  Social Connections: Unknown (03/26/2023)  Tobacco Use: Medium Risk (03/26/2023)     Readmission Risk Interventions     No data to display

## 2023-04-05 NOTE — Plan of Care (Signed)

## 2023-04-22 ENCOUNTER — Other Ambulatory Visit: Payer: Self-pay

## 2023-04-22 ENCOUNTER — Emergency Department

## 2023-04-22 ENCOUNTER — Inpatient Hospital Stay
Admission: EM | Admit: 2023-04-22 | Discharge: 2023-05-03 | DRG: 872 | Disposition: A | Discharge status: Skilled Nursing Facility | Attending: Internal Medicine | Admitting: Internal Medicine

## 2023-04-22 DIAGNOSIS — A419 Sepsis, unspecified organism: Secondary | ICD-10-CM | POA: Diagnosis present

## 2023-04-22 DIAGNOSIS — A409 Streptococcal sepsis, unspecified: Principal | ICD-10-CM | POA: Diagnosis present

## 2023-04-22 DIAGNOSIS — Z7982 Long term (current) use of aspirin: Secondary | ICD-10-CM

## 2023-04-22 DIAGNOSIS — Z800 Family history of malignant neoplasm of digestive organs: Secondary | ICD-10-CM

## 2023-04-22 DIAGNOSIS — R7989 Other specified abnormal findings of blood chemistry: Principal | ICD-10-CM

## 2023-04-22 DIAGNOSIS — R7881 Bacteremia: Secondary | ICD-10-CM

## 2023-04-22 DIAGNOSIS — N390 Urinary tract infection, site not specified: Secondary | ICD-10-CM | POA: Diagnosis present

## 2023-04-22 DIAGNOSIS — Z79899 Other long term (current) drug therapy: Secondary | ICD-10-CM

## 2023-04-22 DIAGNOSIS — D72829 Elevated white blood cell count, unspecified: Secondary | ICD-10-CM

## 2023-04-22 DIAGNOSIS — R188 Other ascites: Secondary | ICD-10-CM | POA: Diagnosis present

## 2023-04-22 DIAGNOSIS — Z823 Family history of stroke: Secondary | ICD-10-CM

## 2023-04-22 DIAGNOSIS — R1310 Dysphagia, unspecified: Secondary | ICD-10-CM

## 2023-04-22 DIAGNOSIS — E119 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: Secondary | ICD-10-CM | POA: Diagnosis not present

## 2023-04-22 DIAGNOSIS — Z794 Long term (current) use of insulin: Secondary | ICD-10-CM

## 2023-04-22 DIAGNOSIS — Z8249 Family history of ischemic heart disease and other diseases of the circulatory system: Secondary | ICD-10-CM

## 2023-04-22 DIAGNOSIS — E860 Dehydration: Secondary | ICD-10-CM | POA: Diagnosis present

## 2023-04-22 DIAGNOSIS — Z7984 Long term (current) use of oral hypoglycemic drugs: Secondary | ICD-10-CM

## 2023-04-22 DIAGNOSIS — Z87891 Personal history of nicotine dependence: Secondary | ICD-10-CM

## 2023-04-22 DIAGNOSIS — Z880 Allergy status to penicillin: Secondary | ICD-10-CM

## 2023-04-22 DIAGNOSIS — E785 Hyperlipidemia, unspecified: Secondary | ICD-10-CM | POA: Diagnosis not present

## 2023-04-22 DIAGNOSIS — Z66 Do not resuscitate: Secondary | ICD-10-CM | POA: Diagnosis present

## 2023-04-22 DIAGNOSIS — E872 Acidosis, unspecified: Secondary | ICD-10-CM | POA: Diagnosis present

## 2023-04-22 DIAGNOSIS — I10 Essential (primary) hypertension: Secondary | ICD-10-CM | POA: Insufficient documentation

## 2023-04-22 DIAGNOSIS — E875 Hyperkalemia: Secondary | ICD-10-CM | POA: Diagnosis present

## 2023-04-22 DIAGNOSIS — I5032 Chronic diastolic (congestive) heart failure: Secondary | ICD-10-CM | POA: Diagnosis present

## 2023-04-22 DIAGNOSIS — R6520 Severe sepsis without septic shock: Secondary | ICD-10-CM | POA: Diagnosis present

## 2023-04-22 DIAGNOSIS — Z6829 Body mass index (BMI) 29.0-29.9, adult: Secondary | ICD-10-CM

## 2023-04-22 DIAGNOSIS — E663 Overweight: Secondary | ICD-10-CM | POA: Diagnosis present

## 2023-04-22 DIAGNOSIS — N179 Acute kidney failure, unspecified: Secondary | ICD-10-CM | POA: Diagnosis not present

## 2023-04-22 DIAGNOSIS — I110 Hypertensive heart disease with heart failure: Secondary | ICD-10-CM | POA: Diagnosis present

## 2023-04-22 LAB — COMPREHENSIVE METABOLIC PANEL WITH GFR
ALT: 16 U/L (ref 0–44)
AST: 22 U/L (ref 15–41)
Albumin: 3.4 g/dL — ABNORMAL LOW (ref 3.5–5.0)
Alkaline Phosphatase: 57 U/L (ref 38–126)
Anion gap: 15 (ref 5–15)
BUN: 53 mg/dL — ABNORMAL HIGH (ref 8–23)
CO2: 18 mmol/L — ABNORMAL LOW (ref 22–32)
Calcium: 9.2 mg/dL (ref 8.9–10.3)
Chloride: 105 mmol/L (ref 98–111)
Creatinine, Ser: 3.73 mg/dL — ABNORMAL HIGH (ref 0.61–1.24)
GFR, Estimated: 16 mL/min — ABNORMAL LOW (ref >60)
Glucose, Bld: 210 mg/dL — ABNORMAL HIGH (ref 70–99)
Potassium: 5.4 mmol/L — ABNORMAL HIGH (ref 3.5–5.1)
Sodium: 138 mmol/L (ref 135–145)
Total Bilirubin: 0.8 mg/dL (ref 0.0–1.2)
Total Protein: 7.7 g/dL (ref 6.5–8.1)

## 2023-04-22 LAB — CBC WITH DIFFERENTIAL/PLATELET
Abs Immature Granulocytes: 0.22 10*3/uL — ABNORMAL HIGH (ref 0.00–0.07)
Basophils Absolute: 0.1 10*3/uL (ref 0.0–0.1)
Basophils Relative: 1 %
Eosinophils Absolute: 0.1 10*3/uL (ref 0.0–0.5)
Eosinophils Relative: 1 %
HCT: 40.4 % (ref 39.0–52.0)
Hemoglobin: 13.3 g/dL (ref 13.0–17.0)
Immature Granulocytes: 1 %
Lymphocytes Relative: 5 %
Lymphs Abs: 0.8 10*3/uL (ref 0.7–4.0)
MCH: 31.4 pg (ref 26.0–34.0)
MCHC: 32.9 g/dL (ref 30.0–36.0)
MCV: 95.3 fL (ref 80.0–100.0)
Monocytes Absolute: 1.3 10*3/uL — ABNORMAL HIGH (ref 0.1–1.0)
Monocytes Relative: 9 %
Neutro Abs: 12.8 10*3/uL — ABNORMAL HIGH (ref 1.7–7.7)
Neutrophils Relative %: 83 %
Platelets: 278 10*3/uL (ref 150–400)
RBC: 4.24 MIL/uL (ref 4.22–5.81)
RDW: 15.7 % — ABNORMAL HIGH (ref 11.5–15.5)
WBC: 15.2 10*3/uL — ABNORMAL HIGH (ref 4.0–10.5)
nRBC: 0 % (ref 0.0–0.2)

## 2023-04-22 LAB — URINALYSIS, W/ REFLEX TO CULTURE (INFECTION SUSPECTED)
Bilirubin Urine: NEGATIVE
Glucose, UA: NEGATIVE mg/dL
Ketones, ur: NEGATIVE mg/dL
Nitrite: NEGATIVE
Protein, ur: 30 mg/dL — AB
Specific Gravity, Urine: 1.018 (ref 1.005–1.030)
WBC, UA: >50 WBC/hpf (ref 0–5)
pH: 5 (ref 5.0–8.0)

## 2023-04-22 LAB — CBG MONITORING, ED: Glucose-Capillary: 136 mg/dL — ABNORMAL HIGH (ref 70–99)

## 2023-04-22 LAB — PROTIME-INR
INR: 1.2 (ref 0.8–1.2)
Prothrombin Time: 15.3 s — ABNORMAL HIGH (ref 11.4–15.2)

## 2023-04-22 LAB — LACTIC ACID, PLASMA
Lactic Acid, Venous: 4.1 mmol/L (ref 0.5–1.9)
Lactic Acid, Venous: 4.4 mmol/L (ref 0.5–1.9)

## 2023-04-22 MED ORDER — PRAVASTATIN SODIUM 20 MG PO TABS
20.0000 mg | ORAL_TABLET | Freq: Every day | ORAL | Status: DC
  Administered 2023-04-23 – 2023-05-03 (×11): 20 mg via ORAL
  Filled 2023-04-22 (×12): qty 1

## 2023-04-22 MED ORDER — LACTATED RINGERS IV BOLUS
1000.0000 mL | Freq: Once | INTRAVENOUS | Status: AC
  Administered 2023-04-22: 1000 mL via INTRAVENOUS

## 2023-04-22 MED ORDER — INSULIN ASPART 100 UNIT/ML IJ SOLN
0.0000 [IU] | Freq: Three times a day (TID) | INTRAMUSCULAR | Status: DC
  Administered 2023-04-23 – 2023-04-29 (×6): 2 [IU] via SUBCUTANEOUS
  Administered 2023-04-29 – 2023-04-30 (×3): 3 [IU] via SUBCUTANEOUS
  Administered 2023-04-30: 2 [IU] via SUBCUTANEOUS
  Administered 2023-05-01: 3 [IU] via SUBCUTANEOUS
  Administered 2023-05-02 (×3): 2 [IU] via SUBCUTANEOUS
  Administered 2023-05-03: 3 [IU] via SUBCUTANEOUS
  Filled 2023-04-22 (×13): qty 1

## 2023-04-22 MED ORDER — SODIUM CHLORIDE 0.9 % IV BOLUS
1000.0000 mL | Freq: Once | INTRAVENOUS | Status: AC
  Administered 2023-04-22: 1000 mL via INTRAVENOUS

## 2023-04-22 MED ORDER — ACETAMINOPHEN 325 MG PO TABS
650.0000 mg | ORAL_TABLET | Freq: Four times a day (QID) | ORAL | Status: DC | PRN
  Administered 2023-05-01: 650 mg via ORAL
  Filled 2023-04-22: qty 2

## 2023-04-22 MED ORDER — MAGNESIUM HYDROXIDE 400 MG/5ML PO SUSP: 30.0000 mL | Freq: Every day | ORAL | Status: DC | PRN

## 2023-04-22 MED ORDER — VANCOMYCIN VARIABLE DOSE PER UNSTABLE RENAL FUNCTION (PHARMACIST DOSING): Status: DC

## 2023-04-22 MED ORDER — LACTATED RINGERS IV SOLN: INTRAVENOUS | Status: DC

## 2023-04-22 MED ORDER — LACTATED RINGERS IV SOLN: 150.0000 mL/h | INTRAVENOUS | Status: DC

## 2023-04-22 MED ORDER — FUROSEMIDE 40 MG PO TABS: 20.0000 mg | ORAL_TABLET | Freq: Every day | ORAL | Status: DC

## 2023-04-22 MED ORDER — VANCOMYCIN HCL 750 MG/150ML IV SOLN
750.0000 mg | Freq: Once | INTRAVENOUS | Status: DC
  Filled 2023-04-22: qty 150

## 2023-04-22 MED ORDER — SODIUM CHLORIDE 0.9 % IV SOLN
2.0000 g | Freq: Once | INTRAVENOUS | Status: AC
  Administered 2023-04-22: 2 g via INTRAVENOUS
  Filled 2023-04-22: qty 12.5

## 2023-04-22 MED ORDER — VANCOMYCIN HCL IN DEXTROSE 1-5 GM/200ML-% IV SOLN
1000.0000 mg | Freq: Once | INTRAVENOUS | Status: AC
  Administered 2023-04-22: 1000 mg via INTRAVENOUS
  Filled 2023-04-22: qty 200

## 2023-04-22 MED ORDER — VANCOMYCIN HCL IN DEXTROSE 1-5 GM/200ML-% IV SOLN: 1000.0000 mg | Freq: Once | INTRAVENOUS | Status: DC

## 2023-04-22 MED ORDER — ONDANSETRON HCL 4 MG PO TABS: 4.0000 mg | ORAL_TABLET | Freq: Four times a day (QID) | ORAL | Status: DC | PRN

## 2023-04-22 MED ORDER — ASPIRIN 81 MG PO TBEC
81.0000 mg | DELAYED_RELEASE_TABLET | Freq: Every day | ORAL | Status: DC
  Administered 2023-04-23 – 2023-05-03 (×11): 81 mg via ORAL
  Filled 2023-04-22 (×11): qty 1

## 2023-04-22 MED ORDER — ONDANSETRON HCL 4 MG/2ML IJ SOLN: 4.0000 mg | Freq: Four times a day (QID) | INTRAMUSCULAR | Status: DC | PRN

## 2023-04-22 MED ORDER — ENOXAPARIN SODIUM 40 MG/0.4ML IJ SOSY
40.0000 mg | PREFILLED_SYRINGE | INTRAMUSCULAR | Status: DC
  Administered 2023-04-22: 40 mg via SUBCUTANEOUS
  Filled 2023-04-22: qty 0.4

## 2023-04-22 MED ORDER — ACETAMINOPHEN 650 MG RE SUPP: 650.0000 mg | Freq: Four times a day (QID) | RECTAL | Status: DC | PRN

## 2023-04-22 MED ORDER — VITAMIN C 500 MG PO TABS
1000.0000 mg | ORAL_TABLET | Freq: Every day | ORAL | Status: DC
  Administered 2023-04-23 – 2023-05-03 (×11): 1000 mg via ORAL
  Filled 2023-04-22 (×11): qty 2

## 2023-04-22 MED ORDER — SODIUM CHLORIDE 0.9 % IV SOLN: 2.0000 g | Freq: Once | INTRAVENOUS | Status: DC

## 2023-04-22 MED ORDER — INSULIN ASPART 100 UNIT/ML IJ SOLN: 0.0000 [IU] | Freq: Every day | INTRAMUSCULAR | Status: DC

## 2023-04-22 MED ORDER — SODIUM CHLORIDE 0.9 % IV SOLN: 2.0000 g | INTRAVENOUS | Status: DC

## 2023-04-22 MED ORDER — TRAZODONE HCL 50 MG PO TABS
25.0000 mg | ORAL_TABLET | Freq: Every evening | ORAL | Status: DC | PRN
  Administered 2023-04-30 – 2023-05-01 (×2): 25 mg via ORAL
  Filled 2023-04-22 (×2): qty 1

## 2023-04-22 MED ORDER — PIOGLITAZONE HCL 15 MG PO TABS
15.0000 mg | ORAL_TABLET | Freq: Every day | ORAL | Status: DC
  Administered 2023-04-23 – 2023-05-03 (×11): 15 mg via ORAL
  Filled 2023-04-22 (×12): qty 1

## 2023-04-22 MED ORDER — VITAMIN D 25 MCG (1000 UNIT) PO TABS
1000.0000 [IU] | ORAL_TABLET | Freq: Every day | ORAL | Status: DC
  Administered 2023-04-23 – 2023-05-03 (×11): 1000 [IU] via ORAL
  Filled 2023-04-22 (×11): qty 1

## 2023-04-22 MED ORDER — METRONIDAZOLE 500 MG/100ML IV SOLN
500.0000 mg | Freq: Two times a day (BID) | INTRAVENOUS | Status: DC
  Administered 2023-04-22 – 2023-04-23 (×2): 500 mg via INTRAVENOUS
  Filled 2023-04-22 (×2): qty 100

## 2023-04-22 NOTE — ED Triage Notes (Signed)
 Pt here with weakness. Pt hypotensive, states he has not felt good for weeks. Pt denies pain.

## 2023-04-22 NOTE — ED Provider Notes (Signed)
 [facility] Provider Note    Event Date/Time   First MD Initiated Contact with Patient 04/22/23 1705     (approximate)   History   Sepsis   HPI  Derek Lynch is a 83 y.o. male who presents to the emergency department today because of concerns for weakness.  Patient states that he was discharged from rehab roughly 3 days ago.  Since that time he has continued to get weaker.  He got to the point today where he was having a hard time getting out of his chair.  Patient denies any chest pain or shortness of breath.  Denies any dysuria.  Patient denies any fevers or chills.     Physical Exam   Triage Vital Signs: ED Triage Vitals  Encounter Vitals Group     BP 04/22/23 1659 (!) 86/56     Systolic BP Percentile --      Diastolic BP Percentile --      Pulse Rate 04/22/23 1659 (!) 117     Resp 04/22/23 1659 17     Temp 04/22/23 1659 (!) 97.4 F (36.3 C)     Temp Source 04/22/23 1659 Oral     SpO2 04/22/23 1659 95 %     Weight 04/22/23 1700 169 lb 15.6 oz (77.1 kg)     Height 04/22/23 1700 5\' 4"  (1.626 m)     Head Circumference --      Peak Flow --      Pain Score 04/22/23 1700 0     Pain Loc --      Pain Education --      Exclude from Growth Chart --     Most recent vital signs: Vitals:   04/22/23 1659 04/22/23 1714  BP: (!) 86/56   Pulse: (!) 117   Resp: 17   Temp: (!) 97.4 F (36.3 C) 98.5 F (36.9 C)  SpO2: 95%    General: Awake, alert, oriented. CV:  Good peripheral perfusion. Tachycardia. Resp:  Normal effort. Lungs clear. Abd:  No distention. Non tender.  ED Results / Procedures / Treatments   Labs (all labs ordered are listed, but only abnormal results are displayed) Labs Reviewed  COMPREHENSIVE METABOLIC PANEL WITH GFR - Abnormal; Notable for the following components:      Result Value   Potassium 5.4 (*)    CO2 18 (*)    Glucose, Bld 210 (*)    BUN 53 (*)    Creatinine, Ser 3.73 (*)    Albumin 3.4 (*)    GFR, Estimated  16 (*)    All other components within normal limits  LACTIC ACID, PLASMA - Abnormal; Notable for the following components:   Lactic Acid, Venous 4.1 (*)    All other components within normal limits  LACTIC ACID, PLASMA - Abnormal; Notable for the following components:   Lactic Acid, Venous 4.4 (*)    All other components within normal limits  CBC WITH DIFFERENTIAL/PLATELET - Abnormal; Notable for the following components:   WBC 15.2 (*)    RDW 15.7 (*)    Neutro Abs 12.8 (*)    Monocytes Absolute 1.3 (*)    Abs Immature Granulocytes 0.22 (*)    All other components within normal limits  PROTIME-INR - Abnormal; Notable for the following components:   Prothrombin Time 15.3 (*)    All other components within normal limits  CULTURE, BLOOD (ROUTINE X 2)  CULTURE, BLOOD (ROUTINE X 2)  URINALYSIS, W/ REFLEX TO  CULTURE (INFECTION SUSPECTED)     RADIOLOGY I independently interpreted and visualized the CXR. My interpretation: Right sided pneumonia Radiology interpretation:  IMPRESSION:  Minimal right basilar opacity is noted concerning for subsegmental  atelectasis and possible small right pleural effusion.      PROCEDURES:  Critical Care performed: Yes  CRITICAL CARE Performed by: Phineas Semen   Total critical care time: 35 minutes  Critical care time was exclusive of separately billable procedures and treating other patients.  Critical care was necessary to treat or prevent imminent or life-threatening deterioration.  Critical care was time spent personally by me on the following activities: development of treatment plan with patient and/or surrogate as well as nursing, discussions with consultants, evaluation of patient's response to treatment, examination of patient, obtaining history from patient or surrogate, ordering and performing treatments and interventions, ordering and review of laboratory studies, ordering and review of radiographic studies, pulse oximetry and  re-evaluation of patient's condition.     MEDICATIONS ORDERED IN ED: Medications  ceFEPIme (MAXIPIME) 2 g in sodium chloride 0.9 % 100 mL IVPB (has no administration in time range)  vancomycin (VANCOCIN) IVPB 1000 mg/200 mL premix (has no administration in time range)  lactated ringers bolus 1,000 mL (has no administration in time range)  sodium chloride 0.9 % bolus 1,000 mL (1,000 mLs Intravenous New Bag/Given 04/22/23 1719)  lactated ringers bolus 1,000 mL (1,000 mLs Intravenous New Bag/Given 04/22/23 1806)     IMPRESSION / MDM / ASSESSMENT AND PLAN / ED COURSE  I reviewed the triage vital signs and the nursing notes.                              Differential diagnosis includes, but is not limited to, UTI, pneumonia, anemia, dehydration, electrolyte abnormality  Patient's presentation is most consistent with acute presentation with potential threat to life or bodily function.   The patient is on the cardiac monitor to evaluate for evidence of arrhythmia and/or significant heart rate changes.  Patient presented to the emergency department today because of concerns for weakness.  Initial vital signs concerning for hypotension and tachycardia.  On exam patient without any focal complaints or findings.  Blood work does shows a leukocytosis.  I do have concerns for infection so we will start broad-spectrum IV antibiotics and IV fluids.  Creatinine elevated concerning for AKI  Lactic acid level elevated.  At this time I do have concerns for infection.  Family member at bedside states that the patient was tested and was positive for UTI a couple of days ago.  At this time I think likely that is the source although I do not have a UA here in the emergency department.  Discussed with Dr. Arville Care with the hospitalist service who will evaluate for admission.    FINAL CLINICAL IMPRESSION(S) / ED DIAGNOSES   Final diagnoses:  Elevated lactic acid level  AKI (acute kidney injury) (HCC)   Leukocytosis, unspecified type        Note:  This document was prepared using Dragon voice recognition software and may include unintentional dictation errors.    Phineas Semen, MD 04/22/23 2154

## 2023-04-22 NOTE — Assessment & Plan Note (Addendum)
-   This is likely prerenal due to volume depletion and dehydration. - It is associated with mild metabolic acidosis. - The patient be hydrated with IV lactated ringer as mentioned above. - We will avoid nephrotoxins. - Will follow BMP.

## 2023-04-22 NOTE — Assessment & Plan Note (Signed)
 -  We will continue statin therapy.

## 2023-04-22 NOTE — Assessment & Plan Note (Addendum)
-   The patient will be admitted to a medical telemetry observation bed. - This is likely secondary to UTI and less likely pneumonia. - Sepsis manifested by leukocytosis, tachycardia and tachypnea. - We will continue to pack therapy with IV cefepime, vancomycin and Flagyl. - We will wait urinalysis and urine culture and sensitivity. - We will follow blood cultures. - The patient will be continued on hydration with IV lactated ringer. - She meets severe sepsis criteria given elevated lactic acid and AKI.

## 2023-04-22 NOTE — Assessment & Plan Note (Signed)
-   The patient will be placed on supplemental coverage with NovoLog. - We will continue Actos.

## 2023-04-22 NOTE — Assessment & Plan Note (Signed)
-   We will continue antihypertensive therapy while holding off nephrotoxins.

## 2023-04-22 NOTE — Progress Notes (Signed)
 Pharmacy Antibiotic Note  Derek Lynch is a 83 y.o. male admitted on 04/22/2023 with  infection of unknown source .  Pharmacy has been consulted for cefepime and vancomycin dosing x 7 days.  Plan:  Give vancomycin 750 mg IV x1 to complete loading dose of 1750 mg. Check random vanc level tomorrow given AKI (SCr 3.73, baseline 1-1.2) Start cefepime 2 g IV Q24H Patient is also on metronidazole 500 mg IV Q12H Continue to monitor renal function and follow culture results   Height: 5\' 4"  (162.6 cm) Weight: 77.1 kg (169 lb 15.6 oz) IBW/kg (Calculated) : 59.2  Temp (24hrs), Avg:98 F (36.7 C), Min:97.4 F (36.3 C), Max:98.5 F (36.9 C)  Recent Labs  Lab 04/22/23 1732 04/22/23 1837  WBC 15.2*  --   CREATININE 3.73*  --   LATICACIDVEN 4.1* 4.4*    Estimated Creatinine Clearance: 14.8 mL/min (A) (by C-G formula based on SCr of 3.73 mg/dL (H)).    Allergies  Allergen Reactions   Penicillin G Other (See Comments)    Antimicrobials this admission: 4/3 Vanc >>  4/3 Cefepime >>  4/3 Metronidazole>>   Microbiology results: 4/3 BCx: IP  Thank you for allowing pharmacy to be a part of this patient's care.  Merryl Hacker, PharmD Clinical Pharmacist  04/22/2023 8:39 PM

## 2023-04-22 NOTE — ED Notes (Signed)
 CCMD called to place pt on cardiac monitor per order

## 2023-04-22 NOTE — ED Notes (Signed)
 Pt urinated x1 on self. Pt cleaned, placed in hospital gown. New linens placed on bed.

## 2023-04-22 NOTE — ED Triage Notes (Signed)
 First Nurse Note;  Pt via [facility] from home. Pt c/o ever since he has been to rehab he has been more lethargic and altered that normally. Possible UTI, strong odor smell.  114 HR  124/74 30 RR 25-30 ETCO 1 98.8 95%  20G L AC

## 2023-04-22 NOTE — H&P (Signed)
    PATIENT NAME: Derek Lynch    MR#:  [id number]  DATE OF BIRTH:  1940-07-14  DATE OF ADMISSION:  04/22/2023  PRIMARY CARE PHYSICIAN: Cheryll Dessert, FNP   Patient is coming from: Home  REQUESTING/REFERRING PHYSICIAN: Phineas Semen, MD  CHIEF COMPLAINT:   Chief Complaint  Patient presents with   Sepsis    HISTORY OF PRESENT ILLNESS:  Derek Lynch is a 83 y.o. Caucasian male with medical history significant for osteoarthritis, chronic venous insufficiency, CHF, type 2 diabetes mellitus, hypertension and dyslipidemia, who presented to the emergency room with acute onset of generalized weakness and suspected sepsis.  The patient was discharged from rehab about 3 days ago and continued to get weaker.  He was having hard time getting out of his chair today.  He admits to urinary frequency and dysuria without hematuria or flank pain.  No fever or chills.  No nausea or vomiting or abdominal pain or diarrhea.  No chest pain or palpitations.  He has been having mild dyspnea and wheezing without significant cough.  ED Course: When the patient came to the ER, BP was 86/56 with temperature 97.4, heart rate 117 then 106 and respiratory to 17 and later 24 with temperature of 98.5 and pulse symmetry of 95 and later 98% on room air.  Labs revealed potassium 5.4 and CO2 18, glucose 210, BUN 53 and creatinine 3.73 compared to 32/1.18 on 3/14 and albumin 3.4.  Lactic acid was 4.1.  CBC showed leukocytosis 15.2 with neutrophilia.  INR was 1.2 and PT 15.3.  Blood cultures were drawn. EKG as reviewed by me : None Imaging: Portable chest x-ray showed minimal right basal opacity concerning for subsegmental atelectasis and possible small right pleural effusion..  The patient was given IV cefepime and vancomycin, 2 L bolus of IV lactated ringer followed 150 mL/h and 1 L bolus of IV normal saline. PAST MEDICAL HISTORY:   Past Medical History:  Diagnosis Date   Arthritis     Hyperlipidemia   Chronic venous insufficiency, CHF, type 2 diabetes mellitus, hypertension and dyslipidemia.  PAST SURGICAL HISTORY:   Past Surgical History:  Procedure Laterality Date   COLONOSCOPY      SOCIAL HISTORY:   Social History   Tobacco Use   Smoking status: Former   Smokeless tobacco: Never  Substance Use Topics   Alcohol use: No    FAMILY HISTORY:   Family History  Problem Relation Age of Onset   Heart attack Mother    Stroke Father    Pancreatic cancer Father     DRUG ALLERGIES:   Allergies  Allergen Reactions   Penicillin G Other (See Comments)    REVIEW OF SYSTEMS:   ROS As per history of present illness. All pertinent systems were reviewed above. Constitutional, HEENT, cardiovascular, respiratory, GI, GU, musculoskeletal, neuro, psychiatric, endocrine, integumentary and hematologic systems were reviewed and are otherwise negative/unremarkable except for positive findings mentioned above in the HPI.   MEDICATIONS AT HOME:   Prior to Admission medications   Medication Sig Start Date End Date Taking? Authorizing Provider  acetaminophen (TYLENOL) 325 MG tablet Take 650 mg by mouth every 6 (six) hours as needed for mild pain, moderate pain or headache.    [provider]  Ascorbic Acid (VITAMIN C) 1000 MG tablet Take 1,000 mg by mouth daily.    [provider]  aspirin EC 81 MG tablet Take 81 mg by mouth daily.    [provider]  cholecalciferol (VITAMIN D) 1000 units tablet Take 1,000 Units by mouth daily.    [provider]  furosemide (LASIX) 20 MG tablet Take 1 tablet (20 mg total) by mouth daily. 11/27/21   Rai, Ripudeep K, MD  lisinopril (ZESTRIL) 2.5 MG tablet Take 2.5 mg by mouth daily.    [provider]  lovastatin (MEVACOR) 20 MG tablet Take 20 mg by mouth daily at 6 PM. 11/02/22   [provider]  metFORMIN (GLUCOPHAGE) 1000 MG tablet Take 1 tablet by mouth 2 (two) times daily with a  meal. 10/12/22   [provider]  pioglitazone (ACTOS) 15 MG tablet Take 15 mg by mouth daily.    [provider]      VITAL SIGNS:  Blood pressure 123/73, pulse (!) 101, temperature 98.3 F (36.8 C), temperature source Oral, resp. rate (!) 25, height 5\' 4"  (1.626 m), weight 77.1 kg, SpO2 98%.  PHYSICAL EXAMINATION:  Physical Exam  GENERAL:  83 y.o.-year-old Caucasian male patient lying in the bed with no acute distress.  EYES: Pupils equal, round, reactive to light and accommodation. No scleral icterus. Extraocular muscles intact.  HEENT: Head atraumatic, normocephalic. Oropharynx and nasopharynx clear.  NECK:  Supple, no jugular venous distention. No thyroid enlargement, no tenderness.  LUNGS: Normal breath sounds bilaterally, no wheezing, rales,rhonchi or crepitation. No use of accessory muscles of respiration.  CARDIOVASCULAR: Regular rate and rhythm, S1, S2 normal. No murmurs, rubs, or gallops.  ABDOMEN: Soft, nondistended, nontender. Bowel sounds present. No organomegaly or mass.  EXTREMITIES: No pedal edema, cyanosis, or clubbing.  NEUROLOGIC: Cranial nerves II through XII are intact. Muscle strength 5/5 in all extremities. Sensation intact. Gait not checked.  PSYCHIATRIC: The patient is alert and oriented x 3.  Normal affect and good eye contact. SKIN: No obvious rash, lesion, or ulcer.   LABORATORY PANEL:   CBC Recent Labs  Lab 04/22/23 1732  WBC 15.2*  HGB 13.3  HCT 40.4  PLT 278   ------------------------------------------------------------------------------------------------------------------  Chemistries  Recent Labs  Lab 04/22/23 1732  NA 138  K 5.4*  CL 105  CO2 18*  GLUCOSE 210*  BUN 53*  CREATININE 3.73*  CALCIUM 9.2  AST 22  ALT 16  ALKPHOS 57  BILITOT 0.8   ------------------------------------------------------------------------------------------------------------------  Cardiac Enzymes No results for input(s): "TROPONINI"  in the last 168 hours. ------------------------------------------------------------------------------------------------------------------  RADIOLOGY:  DG Chest Port 1 View Result Date: 04/22/2023 CLINICAL DATA:  Sepsis.  Altered mental status. EXAM: PORTABLE CHEST 1 VIEW COMPARISON:  March 27, 2023. FINDINGS: Stable cardiomegaly. Left lung is clear. Minimal right basilar opacity is noted concerning for subsegmental atelectasis and possible small effusion. Bony thorax is unremarkable. IMPRESSION: Minimal right basilar opacity is noted concerning for subsegmental atelectasis and possible small right pleural effusion. Electronically Signed   By: Lupita Raider M.D.   On: 04/22/2023 17:41      IMPRESSION AND PLAN:  Assessment and Plan: * Sepsis due to undetermined organism [facility]) - The patient will be admitted to a medical telemetry observation bed. - This is likely secondary to UTI and less likely pneumonia. - Sepsis manifested by leukocytosis, tachycardia and tachypnea. - We will continue to pack therapy with IV cefepime, vancomycin and Flagyl. - We will wait urinalysis and urine culture and sensitivity. - We will follow blood cultures. - The patient will be continued on hydration with IV lactated ringer. - She meets severe sepsis criteria given elevated lactic acid and AKI.   AKI (  acute kidney injury) (HCC) - This is likely prerenal due to volume depletion and dehydration. - It is associated with mild metabolic acidosis. - The patient be hydrated with IV lactated ringer as mentioned above. - We will avoid nephrotoxins. - Will follow BMP.  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 (HCC) - The patient will be placed on supplemental coverage with NovoLog. - We will continue Actos.  Essential hypertension - We will continue antihypertensive therapy while holding off nephrotoxins.  Dyslipidemia - We will continue statin therapy.   DVT prophylaxis: Lovenox.  Advanced Care  Planning:  Code Status: The patient is DNR and DNI.  This was discussed with him. Family Communication:  The plan of care was discussed in details with the patient (and family). I answered all questions. The patient agreed to proceed with the above mentioned plan. Further management will depend upon hospital course. Disposition Plan: Back to previous home environment Consults called: none.  All the records are reviewed and case discussed with ED provider.  Status is: Observation  I certify that at the time of admission, it is my clinical judgment that the patient will require  hospital care extending less than 2 midnights.                            Dispo: The patient is from: Home              Anticipated d/c is to: Home              Patient currently is not medically stable to d/c.              Difficult to place patient: No  Hannah Beat M.D on 04/22/2023 at 10:00 PM  [facility]   From 7 PM-7 AM, contact night-coverage [email or web address]  CC: Primary care physician; Cheryll Dessert, FNP

## 2023-04-22 NOTE — ED Notes (Signed)
 MD notified of difficulty getting 2nd blood culture. Lab en route to draw 2nd specimen. MD gave permission to initiate antibiotic therapy before 2nd culture drawn

## 2023-04-23 DIAGNOSIS — B954 Other streptococcus as the cause of diseases classified elsewhere: Secondary | ICD-10-CM | POA: Diagnosis not present

## 2023-04-23 DIAGNOSIS — Z800 Family history of malignant neoplasm of digestive organs: Secondary | ICD-10-CM | POA: Diagnosis not present

## 2023-04-23 DIAGNOSIS — E872 Acidosis, unspecified: Secondary | ICD-10-CM | POA: Diagnosis present

## 2023-04-23 DIAGNOSIS — Z794 Long term (current) use of insulin: Secondary | ICD-10-CM | POA: Diagnosis not present

## 2023-04-23 DIAGNOSIS — Z7984 Long term (current) use of oral hypoglycemic drugs: Secondary | ICD-10-CM | POA: Diagnosis not present

## 2023-04-23 DIAGNOSIS — E663 Overweight: Secondary | ICD-10-CM | POA: Diagnosis present

## 2023-04-23 DIAGNOSIS — A409 Streptococcal sepsis, unspecified: Secondary | ICD-10-CM | POA: Diagnosis present

## 2023-04-23 DIAGNOSIS — E860 Dehydration: Secondary | ICD-10-CM | POA: Diagnosis present

## 2023-04-23 DIAGNOSIS — R188 Other ascites: Secondary | ICD-10-CM | POA: Diagnosis present

## 2023-04-23 DIAGNOSIS — Z8249 Family history of ischemic heart disease and other diseases of the circulatory system: Secondary | ICD-10-CM | POA: Diagnosis not present

## 2023-04-23 DIAGNOSIS — A419 Sepsis, unspecified organism: Secondary | ICD-10-CM | POA: Diagnosis not present

## 2023-04-23 DIAGNOSIS — R7881 Bacteremia: Secondary | ICD-10-CM | POA: Diagnosis not present

## 2023-04-23 DIAGNOSIS — R0609 Other forms of dyspnea: Secondary | ICD-10-CM | POA: Diagnosis not present

## 2023-04-23 DIAGNOSIS — N179 Acute kidney failure, unspecified: Secondary | ICD-10-CM | POA: Diagnosis present

## 2023-04-23 DIAGNOSIS — E875 Hyperkalemia: Secondary | ICD-10-CM | POA: Diagnosis present

## 2023-04-23 DIAGNOSIS — E785 Hyperlipidemia, unspecified: Secondary | ICD-10-CM | POA: Diagnosis present

## 2023-04-23 DIAGNOSIS — Z66 Do not resuscitate: Secondary | ICD-10-CM | POA: Diagnosis present

## 2023-04-23 DIAGNOSIS — R6520 Severe sepsis without septic shock: Secondary | ICD-10-CM | POA: Diagnosis present

## 2023-04-23 DIAGNOSIS — Z880 Allergy status to penicillin: Secondary | ICD-10-CM | POA: Diagnosis not present

## 2023-04-23 DIAGNOSIS — N390 Urinary tract infection, site not specified: Secondary | ICD-10-CM | POA: Diagnosis present

## 2023-04-23 DIAGNOSIS — Z87891 Personal history of nicotine dependence: Secondary | ICD-10-CM | POA: Diagnosis not present

## 2023-04-23 DIAGNOSIS — Z823 Family history of stroke: Secondary | ICD-10-CM | POA: Diagnosis not present

## 2023-04-23 DIAGNOSIS — Z6829 Body mass index (BMI) 29.0-29.9, adult: Secondary | ICD-10-CM | POA: Diagnosis not present

## 2023-04-23 DIAGNOSIS — I5032 Chronic diastolic (congestive) heart failure: Secondary | ICD-10-CM | POA: Diagnosis present

## 2023-04-23 DIAGNOSIS — Z79899 Other long term (current) drug therapy: Secondary | ICD-10-CM | POA: Diagnosis not present

## 2023-04-23 DIAGNOSIS — E119 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: Secondary | ICD-10-CM | POA: Diagnosis present

## 2023-04-23 DIAGNOSIS — Z7982 Long term (current) use of aspirin: Secondary | ICD-10-CM | POA: Diagnosis not present

## 2023-04-23 DIAGNOSIS — I110 Hypertensive heart disease with heart failure: Secondary | ICD-10-CM | POA: Diagnosis present

## 2023-04-23 LAB — BASIC METABOLIC PANEL WITH GFR
Anion gap: 8 (ref 5–15)
BUN: 41 mg/dL — ABNORMAL HIGH (ref 8–23)
CO2: 22 mmol/L (ref 22–32)
Calcium: 7.9 mg/dL — ABNORMAL LOW (ref 8.9–10.3)
Chloride: 109 mmol/L (ref 98–111)
Creatinine, Ser: 2.93 mg/dL — ABNORMAL HIGH (ref 0.61–1.24)
GFR, Estimated: 21 mL/min — ABNORMAL LOW (ref >60)
Glucose, Bld: 125 mg/dL — ABNORMAL HIGH (ref 70–99)
Potassium: 4.3 mmol/L (ref 3.5–5.1)
Sodium: 139 mmol/L (ref 135–145)

## 2023-04-23 LAB — BLOOD CULTURE ID PANEL (REFLEXED) - BCID2

## 2023-04-23 LAB — CBG MONITORING, ED
Glucose-Capillary: 112 mg/dL — ABNORMAL HIGH (ref 70–99)
Glucose-Capillary: 114 mg/dL — ABNORMAL HIGH (ref 70–99)

## 2023-04-23 LAB — CBC
HCT: 30.7 % — ABNORMAL LOW (ref 39.0–52.0)
Hemoglobin: 10.4 g/dL — ABNORMAL LOW (ref 13.0–17.0)
MCH: 31.9 pg (ref 26.0–34.0)
MCHC: 33.9 g/dL (ref 30.0–36.0)
MCV: 94.2 fL (ref 80.0–100.0)
Platelets: 175 10*3/uL (ref 150–400)
RBC: 3.26 MIL/uL — ABNORMAL LOW (ref 4.22–5.81)
RDW: 15.4 % (ref 11.5–15.5)
WBC: 8.2 10*3/uL (ref 4.0–10.5)
nRBC: 0 % (ref 0.0–0.2)

## 2023-04-23 LAB — CORTISOL-AM, BLOOD: Cortisol - AM: 11.7 ug/dL (ref 6.7–22.6)

## 2023-04-23 LAB — PROTIME-INR
INR: 1.3 — ABNORMAL HIGH (ref 0.8–1.2)
Prothrombin Time: 16.8 s — ABNORMAL HIGH (ref 11.4–15.2)

## 2023-04-23 LAB — GLUCOSE, CAPILLARY
Glucose-Capillary: 124 mg/dL — ABNORMAL HIGH (ref 70–99)
Glucose-Capillary: 117 mg/dL — ABNORMAL HIGH (ref 70–99)

## 2023-04-23 MED ORDER — SODIUM CHLORIDE 0.9 % IV SOLN
2.0000 g | INTRAVENOUS | Status: AC
  Administered 2023-04-23 – 2023-04-29 (×7): 2 g via INTRAVENOUS
  Filled 2023-04-23 (×8): qty 20

## 2023-04-23 MED ORDER — HEPARIN SODIUM (PORCINE) 5000 UNIT/ML IJ SOLN
5000.0000 [IU] | Freq: Three times a day (TID) | INTRAMUSCULAR | Status: DC
  Administered 2023-04-23 – 2023-05-03 (×30): 5000 [IU] via SUBCUTANEOUS
  Filled 2023-04-23 (×28): qty 1

## 2023-04-23 NOTE — Progress Notes (Signed)
 PROGRESS NOTE    Derek Lynch   WUJ:[id number] DOB: 1940/10/30  DOA: 04/22/2023 Date of Service: 04/23/23 which is hospital day 1  PCP: Cheryll Dessert, [facility] course / significant events:   HPI: Derek Lynch is a 83 y.o. Caucasian male with medical history significant for osteoarthritis, chronic venous insufficiency, HFpEF, DM2, HTN, HLD. He presented to the emergency room with acute onset of generalized weakness. He was discharged from rehab about 3 days prior, but continued to get weaker w/ hard time getting out of his chair today.  He admits to urinary frequency and dysuria, mild dyspnea   Of note, recent admission 03/07-03/17 for influenza A w/ hypoxia initially requiring BiPAP, rhabdomyolysis, AKI. D/c to SNF.   04/03: to ED, hypotension, tachycardia, tachypnea, elevated WBC, elevated lactic, mild hyperkalemia 5.4, AKI w/ Cr 3.73. CXR question R pleural effusion. Started on IV cefepime, vancomycin, IV fluids. UA concern for UTI.  04/04: improved WBC, Cr, K, VS. BCx(+) strep, pending ID/susceptibilities, deescalate abx to rocephin only for now. Awaiting culture results.      Consultants:  none  Procedures/Surgeries: none      ASSESSMENT & PLAN:   Sepsis due to undetermined organism  Severe sepsis (AKI)  Urinary Tract Infection BCx(+) strep, pending ID/susceptibilities  Deescalate abx to ceftriaxone only for now  IV fluids can d/c once taking po  Monitor sepsis parameters --> improved VS and WBC    AKI (acute kidney injury) - improving  likely prerenal due to volume depletion and dehydration. Associated with mild metabolic acidosis which has resolved. IV fluids can d/c once taking po  Avoid nephrotoxins Treat UTI  Monitor BMP   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  SSI continue Actos.   Essential hypertension Holding home lisinopril, lasix given AKI and soft BP   Dyslipidemia continue statin      overweight based on BMI: Body  mass index is 29.18 kg/m.  Underweight - under 18  overweight - 25 to 29 obese - 30 or more Class 1 obesity: BMI of 30.0 to 34 Class 2 obesity: BMI of 35.0 to 39 Class 3 obesity: BMI of 40.0 to 49 Super Morbid Obesity: BMI 50-59 Super-super Morbid Obesity: BMI 60+ Significantly low or high BMI is associated with higher medical risk.  Weight management advised as adjunct to other disease management and risk reduction treatments    DVT prophylaxis: heparin IV fluids: can d/c continuous IV fluids once taking po Nutrition: carb modified diet Central lines / other devices: none  Code Status: DNR ACP documentation reviewed:  none on file in [facility]  TOC needs: TBD Medical barriers to dispo: culture results, clinical improvement AKI. Expected medical readiness for discharge 2-3 days.       Subjective / Brief ROS:  Patient reports no concerns at this time Denies CP/SOB.  Pain controlled.  Denies new weakness.  Tolerating diet.  Reports no concerns w/ urination/defecation.   Family Communication: spoke w [name] over the phone 04/23/23 2:39 PM all questions answered. She states she is working on getting him in to assisted living at [facility]    Objective Findings:  Vitals:   04/23/23 0648 04/23/23 0943 04/23/23 0950 04/23/23 1200  BP: 99/65  103/87 91/60  Pulse: 81  68 71  Resp: (!) 22  18 (!) 25  Temp:  98.1 F (36.7 C)    TempSrc:  Oral    SpO2: 96%  98% 96%  Weight:  Height:        Intake/Output Summary (Last 24 hours) at 04/23/2023 1424 Last data filed at 04/22/2023 1854 Gross per 24 hour  Intake 1100 ml  Output --  Net 1100 ml   Filed Weights   04/22/23 1700  Weight: 77.1 kg    Examination:  Physical Exam Constitutional:      General: He is not in acute distress. Cardiovascular:     Rate and Rhythm: Normal rate and regular rhythm.  Pulmonary:     Effort: Pulmonary effort is normal.     Breath sounds: Normal breath sounds.  Abdominal:     General:  Abdomen is flat.  Skin:    General: Skin is warm and dry.  Neurological:     Mental Status: He is alert.  Psychiatric:        Mood and Affect: Mood normal.        Behavior: Behavior normal.          Scheduled Medications:   vitamin C  1,000 mg Oral Daily   aspirin EC  81 mg Oral Daily   cholecalciferol  1,000 Units Oral Daily   heparin injection (subcutaneous)  5,000 Units Subcutaneous Q8H   insulin aspart  0-15 Units Subcutaneous TID WC   insulin aspart  0-5 Units Subcutaneous QHS   pioglitazone  15 mg Oral Daily   pravastatin  20 mg Oral q1800    Continuous Infusions:  cefTRIAXone (ROCEPHIN)  IV      PRN Medications:  acetaminophen **OR** acetaminophen, magnesium hydroxide, ondansetron **OR** ondansetron (ZOFRAN) IV, traZODone  Antimicrobials from admission:  Anti-infectives (From admission, onward)    Start     Dose/Rate Route Frequency Ordered Stop   04/23/23 2000  ceFEPIme (MAXIPIME) 2 g in sodium chloride 0.9 % 100 mL IVPB  Status:  Discontinued        2 g 200 mL/hr over 30 Minutes Intravenous  Once 04/22/23 2033 04/22/23 2045   04/23/23 2000  ceFEPIme (MAXIPIME) 2 g in sodium chloride 0.9 % 100 mL IVPB  Status:  Discontinued        2 g 200 mL/hr over 30 Minutes Intravenous Every 24 hours 04/22/23 2045 04/23/23 0912   04/23/23 1800  cefTRIAXone (ROCEPHIN) 2 g in sodium chloride 0.9 % 100 mL IVPB        2 g 200 mL/hr over 30 Minutes Intravenous Every 24 hours 04/23/23 0912     04/22/23 2100  metroNIDAZOLE (FLAGYL) IVPB 500 mg  Status:  Discontinued        500 mg 100 mL/hr over 60 Minutes Intravenous Every 12 hours 04/22/23 2033 04/23/23 0912   04/22/23 2100  vancomycin (VANCOREADY) IVPB 750 mg/150 mL  Status:  Discontinued        750 mg 150 mL/hr over 60 Minutes Intravenous  Once 04/22/23 2045 04/23/23 0912   04/22/23 2046  vancomycin variable dose per unstable renal function (pharmacist dosing)  Status:  Discontinued         Does not apply See admin  instructions 04/22/23 2046 04/23/23 0912   04/22/23 2045  vancomycin (VANCOCIN) IVPB 1000 mg/200 mL premix  Status:  Discontinued        1,000 mg 200 mL/hr over 60 Minutes Intravenous  Once 04/22/23 2033 04/22/23 2037   04/22/23 2043  vancomycin variable dose per unstable renal function (pharmacist dosing)  Status:  Discontinued         Does not apply See admin instructions 04/22/23 2045 04/22/23 2046  04/22/23 1745  ceFEPIme (MAXIPIME) 2 g in sodium chloride 0.9 % 100 mL IVPB        2 g 200 mL/hr over 30 Minutes Intravenous  Once 04/22/23 1742 04/22/23 1854   04/22/23 1745  vancomycin (VANCOCIN) IVPB 1000 mg/200 mL premix        1,000 mg 200 mL/hr over 60 Minutes Intravenous  Once 04/22/23 1742 04/22/23 1954           Data Reviewed:  I have personally reviewed the following...  CBC: Recent Labs  Lab 04/22/23 1732 04/23/23 0450  WBC 15.2* 8.2  NEUTROABS 12.8*  --   HGB 13.3 10.4*  HCT 40.4 30.7*  MCV 95.3 94.2  PLT 278 175   Basic Metabolic Panel: Recent Labs  Lab 04/22/23 1732 04/23/23 0450  NA 138 139  K 5.4* 4.3  CL 105 109  CO2 18* 22  GLUCOSE 210* 125*  BUN 53* 41*  CREATININE 3.73* 2.93*  CALCIUM 9.2 7.9*   GFR: Estimated Creatinine Clearance: 18.9 mL/min (A) (by C-G formula based on SCr of 2.93 mg/dL (H)). Liver Function Tests: Recent Labs  Lab 04/22/23 1732  AST 22  ALT 16  ALKPHOS 57  BILITOT 0.8  PROT 7.7  ALBUMIN 3.4*   No results for input(s): "LIPASE", "AMYLASE" in the last 168 hours. No results for input(s): "AMMONIA" in the last 168 hours. Coagulation Profile: Recent Labs  Lab 04/22/23 1732 04/23/23 0450  INR 1.2 1.3*   Cardiac Enzymes: No results for input(s): "CKTOTAL", "CKMB", "CKMBINDEX", "TROPONINI" in the last 168 hours. BNP (last 3 results) No results for input(s): "PROBNP" in the last 8760 hours. HbA1C: No results for input(s): "HGBA1C" in the last 72 hours. CBG: Recent Labs  Lab 04/22/23 2146 04/23/23 0750  04/23/23 1128  GLUCAP 136* 112* 114*   Lipid Profile: No results for input(s): "CHOL", "HDL", "LDLCALC", "TRIG", "CHOLHDL", "LDLDIRECT" in the last 72 hours. Thyroid Function Tests: No results for input(s): "TSH", "T4TOTAL", "FREET4", "T3FREE", "THYROIDAB" in the last 72 hours. Anemia Panel: No results for input(s): "VITAMINB12", "FOLATE", "FERRITIN", "TIBC", "IRON", "RETICCTPCT" in the last 72 hours. Most Recent Urinalysis On File:     Component Value Date/Time   COLORURINE YELLOW (A) 04/22/2023 2232   APPEARANCEUR CLOUDY (A) 04/22/2023 2232   LABSPEC 1.018 04/22/2023 2232   PHURINE 5.0 04/22/2023 2232   GLUCOSEU NEGATIVE 04/22/2023 2232   HGBUR MODERATE (A) 04/22/2023 2232   BILIRUBINUR NEGATIVE 04/22/2023 2232   KETONESUR NEGATIVE 04/22/2023 2232   PROTEINUR 30 (A) 04/22/2023 2232   NITRITE NEGATIVE 04/22/2023 2232   LEUKOCYTESUR LARGE (A) 04/22/2023 2232   Sepsis Labs: @LABRCNTIP (procalcitonin:4,lacticidven:4) Microbiology: Recent Results (from the past 240 hours)  Culture, blood (Routine x 2)     Status: None (Preliminary result)   Collection Time: 04/22/23  5:20 PM   Specimen: BLOOD  Result Value Ref Range Status   Specimen Description BLOOD RAC  Final   Special Requests   Final    BOTTLES DRAWN AEROBIC AND ANAEROBIC Blood Culture results may not be optimal due to an inadequate volume of blood received in culture bottles   Culture  Setup Time   Final    ANAEROBIC BOTTLE ONLY GRAM POSITIVE COCCI Organism ID to follow CRITICAL RESULT CALLED TO, READ BACK BY AND VERIFIED WITH: TREY GREENWOOD @0736  04/23/23 MJU Performed at [facility], [address] [place], [place] [address]    Culture [facility] POSITIVE COCCI  Final   Report Status PENDING  Incomplete  Blood  Culture ID Panel (Reflexed)     Status: Abnormal   Collection Time: 04/22/23  5:20 PM  Result Value Ref Range Status   Enterococcus faecalis NOT DETECTED NOT DETECTED Final   Enterococcus Faecium NOT  DETECTED NOT DETECTED Final   Listeria monocytogenes NOT DETECTED NOT DETECTED Final   Staphylococcus species NOT DETECTED NOT DETECTED Final   Staphylococcus aureus (BCID) NOT DETECTED NOT DETECTED Final   Staphylococcus epidermidis NOT DETECTED NOT DETECTED Final   Staphylococcus lugdunensis NOT DETECTED NOT DETECTED Final   Streptococcus species DETECTED (A) NOT DETECTED Final    Comment: Not Enterococcus species, Streptococcus agalactiae, Streptococcus pyogenes, or Streptococcus pneumoniae. CRITICAL RESULT CALLED TO, READ BACK BY AND VERIFIED WITH: TREY GREENWOOD @0736  04/23/23 MJU    Streptococcus agalactiae NOT DETECTED NOT DETECTED Final   Streptococcus pneumoniae NOT DETECTED NOT DETECTED Final   Streptococcus pyogenes NOT DETECTED NOT DETECTED Final   A.calcoaceticus-baumannii NOT DETECTED NOT DETECTED Final   Bacteroides fragilis NOT DETECTED NOT DETECTED Final   Enterobacterales NOT DETECTED NOT DETECTED Final   Enterobacter cloacae complex NOT DETECTED NOT DETECTED Final   Escherichia coli NOT DETECTED NOT DETECTED Final   Klebsiella aerogenes NOT DETECTED NOT DETECTED Final   Klebsiella oxytoca NOT DETECTED NOT DETECTED Final   Klebsiella pneumoniae NOT DETECTED NOT DETECTED Final   Proteus species NOT DETECTED NOT DETECTED Final   Salmonella species NOT DETECTED NOT DETECTED Final   Serratia marcescens NOT DETECTED NOT DETECTED Final   Haemophilus influenzae NOT DETECTED NOT DETECTED Final   Neisseria meningitidis NOT DETECTED NOT DETECTED Final   Pseudomonas aeruginosa NOT DETECTED NOT DETECTED Final   Stenotrophomonas maltophilia NOT DETECTED NOT DETECTED Final   Candida albicans NOT DETECTED NOT DETECTED Final   Candida auris NOT DETECTED NOT DETECTED Final   Candida glabrata NOT DETECTED NOT DETECTED Final   Candida krusei NOT DETECTED NOT DETECTED Final   Candida parapsilosis NOT DETECTED NOT DETECTED Final   Candida tropicalis NOT DETECTED NOT DETECTED Final    Cryptococcus neoformans/gattii NOT DETECTED NOT DETECTED Final    Comment: Performed at [facility], [address] [place], [place] [address]  Culture, blood (Routine x 2)     Status: None (Preliminary result)   Collection Time: 04/22/23  6:37 PM   Specimen: BLOOD LEFT HAND  Result Value Ref Range Status   Specimen Description BLOOD LEFT HAND AEROBIC BOTTLE ONLY  Final   Special Requests   Final    AEROBIC BOTTLE ONLY Blood Culture results may not be optimal due to an inadequate volume of blood received in culture bottles   Culture   Final    NO GROWTH < 12 HOURS Performed at [facility], [address], [place], [place] [address]    Report Status PENDING  Incomplete      Radiology Studies last 3 days: DG Chest Port 1 View Result Date: 04/22/2023 CLINICAL DATA:  Sepsis.  Altered mental status. EXAM: PORTABLE CHEST 1 VIEW COMPARISON:  March 27, 2023. FINDINGS: Stable cardiomegaly. Left lung is clear. Minimal right basilar opacity is noted concerning for subsegmental atelectasis and possible small effusion. Bony thorax is unremarkable. IMPRESSION: Minimal right basilar opacity is noted concerning for subsegmental atelectasis and possible small right pleural effusion. Electronically Signed   By: Lupita Raider M.D.   On: 04/22/2023 17:41        Sunnie Nielsen, DO [facility] 04/23/2023, 2:24 PM    Dictation software may have been used to generate the  above note. Typos may occur and escape review in typed/dictated notes. Please contact Dr Lyn Hollingshead directly for clarity if needed.  Staff may message me via secure chat in Epic  but this may not receive an immediate response,  please Mogel me for urgent matters!  If 7PM-7AM, please contact night coverage [email or web address]

## 2023-04-23 NOTE — ED Notes (Signed)
 Attempted to feed pt breakfast. Pt ate 10% of grits and refused everything else. Will continue to monitor.

## 2023-04-23 NOTE — Progress Notes (Signed)
 PHARMACY - PHYSICIAN COMMUNICATION CRITICAL VALUE ALERT - BLOOD CULTURE IDENTIFICATION (BCID)  Derek Lynch is an 83 y.o. male who presented to [facility] on 04/22/2023 with a chief complaint of AMS, ?UTI  Assessment:  Blood cx 04/22/23: 1of 4 (anaerobic bottle) GPC+, Strep species, no resistance,   Patient also w/ UTI symptoms, UA+  Name of physician (or Provider) Contacted: N.Alexander  Current antibiotics: Vancomycin, cefepime. metronidazole  Changes to prescribed antibiotics recommended:  Change abx to Ceftriaxone 2 gm IV q24h for now, f/u cx   Results for orders placed or performed during the hospital encounter of 04/22/23  Blood Culture ID Panel (Reflexed) (Collected: 04/22/2023  5:20 PM)  Result Value Ref Range   Enterococcus faecalis NOT DETECTED NOT DETECTED   Enterococcus Faecium NOT DETECTED NOT DETECTED   Listeria monocytogenes NOT DETECTED NOT DETECTED   Staphylococcus species NOT DETECTED NOT DETECTED   Staphylococcus aureus (BCID) NOT DETECTED NOT DETECTED   Staphylococcus epidermidis NOT DETECTED NOT DETECTED   Staphylococcus lugdunensis NOT DETECTED NOT DETECTED   Streptococcus species DETECTED (A) NOT DETECTED   Streptococcus agalactiae NOT DETECTED NOT DETECTED   Streptococcus pneumoniae NOT DETECTED NOT DETECTED   Streptococcus pyogenes NOT DETECTED NOT DETECTED   A.calcoaceticus-baumannii NOT DETECTED NOT DETECTED   Bacteroides fragilis NOT DETECTED NOT DETECTED   Enterobacterales NOT DETECTED NOT DETECTED   Enterobacter cloacae complex NOT DETECTED NOT DETECTED   Escherichia coli NOT DETECTED NOT DETECTED   Klebsiella aerogenes NOT DETECTED NOT DETECTED   Klebsiella oxytoca NOT DETECTED NOT DETECTED   Klebsiella pneumoniae NOT DETECTED NOT DETECTED   Proteus species NOT DETECTED NOT DETECTED   Salmonella species NOT DETECTED NOT DETECTED   Serratia marcescens NOT DETECTED NOT DETECTED   Haemophilus influenzae NOT DETECTED NOT DETECTED   Neisseria  meningitidis NOT DETECTED NOT DETECTED   Pseudomonas aeruginosa NOT DETECTED NOT DETECTED   Stenotrophomonas maltophilia NOT DETECTED NOT DETECTED   Candida albicans NOT DETECTED NOT DETECTED   Candida auris NOT DETECTED NOT DETECTED   Candida glabrata NOT DETECTED NOT DETECTED   Candida krusei NOT DETECTED NOT DETECTED   Candida parapsilosis NOT DETECTED NOT DETECTED   Candida tropicalis NOT DETECTED NOT DETECTED   Cryptococcus neoformans/gattii NOT DETECTED NOT DETECTED    Bari Mantis PharmD Clinical Pharmacist 04/23/2023

## 2023-04-23 NOTE — Hospital Course (Addendum)
 Hospital course / significant events:   HPI: Derek Lynch is a 83 y.o. Caucasian male with medical history significant for osteoarthritis, chronic venous insufficiency, HFpEF, DM2, HTN, HLD. He presented to the emergency room with acute onset of generalized weakness. He was discharged from rehab about 3 days prior, but continued to get weaker w/ hard time getting out of his chair today.  He admits to urinary frequency and dysuria, mild dyspnea   Of note, recent admission 03/07-03/17 for influenza A w/ hypoxia initially requiring BiPAP, rhabdomyolysis, AKI. D/c to SNF.   04/03: to ED, hypotension, tachycardia, tachypnea, elevated WBC, elevated lactic, mild hyperkalemia 5.4, AKI w/ Cr 3.73. CXR question R pleural effusion. Started on IV cefepime, vancomycin, IV fluids. UA concern for UTI.  04/04: improved WBC, Cr, K, VS. BCx(+) strep, pending ID/susceptibilities, deescalate abx to rocephin only for now. Awaiting culture results.  04/05: UCx never sent. BCx pending ID. AKI improving.  04/06: AKI continues to improve. UCx (+)enterococcus faecalis. BCx (+) strep vestibularis d/w ID, plan echo and repeat BCx tomorrow, cont IV abx   04/07: ID to see today. Echo pending. Repeat BCx obtained.      Consultants:  none  Procedures/Surgeries: none      ASSESSMENT & PLAN:   Sepsis due to undetermined organism  Severe sepsis (AKI)  Urinary Tract Infection UCX (+)enterococcus faecalis BCx(+) strep vestibularis abx to ceftriaxone only for now  ID to see  Echo pending read  Repeat BCx pending  Monitor sepsis parameters --> improved VS and WBC    AKI (acute kidney injury) - improving  Baseline GFR >60 no CKD likely prerenal due to volume depletion and dehydration. Associated with mild metabolic acidosis which has resolved. Avoid nephrotoxins Treat UTI  Monitor BMP Expect UCx may be negative since specimen obtained following initiation of abx    Type 2 diabetes mellitus without  complications  SSI continue Actos.   Essential hypertension Holding home lisinopril, lasix given AKI and soft BP   Dyslipidemia continue statin      overweight based on BMI: Body mass index is 29.18 kg/m.  Underweight - under 18  overweight - 25 to 29 obese - 30 or more Class 1 obesity: BMI of 30.0 to 34 Class 2 obesity: BMI of 35.0 to 39 Class 3 obesity: BMI of 40.0 to 49 Super Morbid Obesity: BMI 50-59 Super-super Morbid Obesity: BMI 60+ Significantly low or high BMI is associated with higher medical risk.  Weight management advised as adjunct to other disease management and risk reduction treatments   DVT prophylaxis: heparin IV fluids: can d/c continuous IV fluids once taking po Nutrition: carb modified diet Central lines / other devices: none  Code Status: DNR ACP documentation reviewed:  none on file in [facility]  [facility] needs: SNF rehab Medical barriers to dispo: Echo, repeat BCx. Expected medical readiness for discharge 2+ days pend ID consult

## 2023-04-24 DIAGNOSIS — A419 Sepsis, unspecified organism: Secondary | ICD-10-CM | POA: Diagnosis not present

## 2023-04-24 LAB — GLUCOSE, CAPILLARY
Glucose-Capillary: 92 mg/dL (ref 70–99)
Glucose-Capillary: 113 mg/dL — ABNORMAL HIGH (ref 70–99)
Glucose-Capillary: 138 mg/dL — ABNORMAL HIGH (ref 70–99)
Glucose-Capillary: 80 mg/dL (ref 70–99)

## 2023-04-24 LAB — BASIC METABOLIC PANEL WITH GFR
Anion gap: 8 (ref 5–15)
BUN: 29 mg/dL — ABNORMAL HIGH (ref 8–23)
CO2: 22 mmol/L (ref 22–32)
Calcium: 8 mg/dL — ABNORMAL LOW (ref 8.9–10.3)
Chloride: 110 mmol/L (ref 98–111)
Creatinine, Ser: 2.03 mg/dL — ABNORMAL HIGH (ref 0.61–1.24)
GFR, Estimated: 33 mL/min — ABNORMAL LOW (ref >60)
Glucose, Bld: 99 mg/dL (ref 70–99)
Potassium: 4.2 mmol/L (ref 3.5–5.1)
Sodium: 140 mmol/L (ref 135–145)

## 2023-04-24 NOTE — Progress Notes (Signed)
 PROGRESS NOTE    Derek Lynch   ZOX:[id number] DOB: 1940-04-29  DOA: 04/22/2023 Date of Service: 04/24/23 which is Lynch day 2  PCP: Derek Lynch, Derek Lynch course / significant events:   HPI: Derek Lynch is a 83 y.o. Caucasian male with medical history significant for osteoarthritis, chronic venous insufficiency, HFpEF, DM2, HTN, HLD. He presented to the emergency room with acute onset of generalized weakness. He was discharged from rehab about 3 days prior, but continued to get weaker w/ hard time getting out of his chair today.  He admits to urinary frequency and dysuria, mild dyspnea   Of note, recent admission 03/07-03/17 for influenza A w/ hypoxia initially requiring BiPAP, rhabdomyolysis, AKI. D/c to SNF.   04/03: to ED, hypotension, tachycardia, tachypnea, elevated WBC, elevated lactic, mild hyperkalemia 5.4, AKI w/ Cr 3.73. CXR question R pleural effusion. Started on IV cefepime, vancomycin, IV fluids. UA concern for UTI.  04/04: improved WBC, Cr, K, VS. BCx(+) strep, pending ID/susceptibilities, deescalate abx to rocephin only for now. Awaiting culture results.  04/05: UCx never sent. BCx pending ID. AKI improving.      Consultants:  none  Procedures/Surgeries: none      ASSESSMENT & PLAN:   Sepsis due to undetermined organism  Severe sepsis (AKI)  Urinary Tract Infection BCx(+) strep, pending ID/susceptibilities  Deescalate abx to ceftriaxone only for now  Monitor sepsis parameters --> improved VS and WBC    AKI (acute kidney injury) - improving  Baseline GFR >60 no CKD likely prerenal due to volume depletion and dehydration. Associated with mild metabolic acidosis which has resolved. IV fluids d/c now that taking po --> AKI conitnuing to improve  Avoid nephrotoxins Treat UTI  Monitor BMP   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  SSI continue Actos.   Essential hypertension Holding home lisinopril, lasix given AKI and soft  BP   Dyslipidemia continue statin      overweight based on BMI: Body mass index is 29.18 kg/m.  Underweight - under 18  overweight - 25 to 29 obese - 30 or more Class 1 obesity: BMI of 30.0 to 34 Class 2 obesity: BMI of 35.0 to 39 Class 3 obesity: BMI of 40.0 to 49 Super Morbid Obesity: BMI 50-59 Super-super Morbid Obesity: BMI 60+ Significantly low or high BMI is associated with higher medical risk.  Weight management advised as adjunct to other disease management and risk reduction treatments   DVT prophylaxis: heparin IV fluids: can d/c continuous IV fluids once taking po Nutrition: carb modified diet Central lines / other devices: none  Code Status: DNR ACP documentation reviewed:  none on file in [facility]  TOC needs: TBD Medical barriers to dispo: culture results, clinical improvement AKI. Expected medical readiness for discharge 1-2 days.       Subjective / Brief ROS:  Patient reports no concerns at this time Denies CP/SOB.  Pain controlled.  Denies new weakness.  Tolerating diet.   Family Communication: none at this time     Objective Findings:  Vitals:   04/23/23 1646 04/23/23 2039 04/24/23 0314 04/24/23 0744  BP: 103/62 107/66 111/62 107/66  Pulse: 72 78 75 79  Resp: 16 16 20 18   Temp: 97.9 F (36.6 C) 98.1 F (36.7 C) 97.9 F (36.6 C) 98.7 F (37.1 C)  TempSrc:  Oral Oral   SpO2: 99% 100% 98% 100%  Weight:      Height:        Intake/Output Summary (  Last 24 hours) at 04/24/2023 1426 Last data filed at 04/24/2023 1610 Gross per 24 hour  Intake 100 ml  Output 450 ml  Net -350 ml   Filed Weights   04/22/23 1700  Weight: 77.1 kg    Examination:  Physical Exam Constitutional:      General: He is not in acute distress. Cardiovascular:     Rate and Rhythm: Normal rate and regular rhythm.  Pulmonary:     Effort: Pulmonary effort is normal.     Breath sounds: Normal breath sounds.  Abdominal:     General: Abdomen is flat.  Skin:     General: Skin is warm and dry.  Neurological:     Mental Status: He is alert.  Psychiatric:        Mood and Affect: Mood normal.        Behavior: Behavior normal.          Scheduled Medications:   vitamin C  1,000 mg Oral Daily   aspirin EC  81 mg Oral Daily   cholecalciferol  1,000 Units Oral Daily   heparin injection (subcutaneous)  5,000 Units Subcutaneous Q8H   insulin aspart  0-15 Units Subcutaneous TID WC   insulin aspart  0-5 Units Subcutaneous QHS   pioglitazone  15 mg Oral Daily   pravastatin  20 mg Oral q1800    Continuous Infusions:  cefTRIAXone (ROCEPHIN)  IV Stopped (04/23/23 2057)    PRN Medications:  acetaminophen **OR** acetaminophen, magnesium hydroxide, ondansetron **OR** ondansetron (ZOFRAN) IV, traZODone  Antimicrobials from admission:  Anti-infectives (From admission, onward)    Start     Dose/Rate Route Frequency Ordered Stop   04/23/23 2000  ceFEPIme (MAXIPIME) 2 g in sodium chloride 0.9 % 100 mL IVPB  Status:  Discontinued        2 g 200 mL/hr over 30 Minutes Intravenous  Once 04/22/23 2033 04/22/23 2045   04/23/23 2000  ceFEPIme (MAXIPIME) 2 g in sodium chloride 0.9 % 100 mL IVPB  Status:  Discontinued        2 g 200 mL/hr over 30 Minutes Intravenous Every 24 hours 04/22/23 2045 04/23/23 0912   04/23/23 1800  cefTRIAXone (ROCEPHIN) 2 g in sodium chloride 0.9 % 100 mL IVPB        2 g 200 mL/hr over 30 Minutes Intravenous Every 24 hours 04/23/23 0912     04/22/23 2100  metroNIDAZOLE (FLAGYL) IVPB 500 mg  Status:  Discontinued        500 mg 100 mL/hr over 60 Minutes Intravenous Every 12 hours 04/22/23 2033 04/23/23 0912   04/22/23 2100  vancomycin (VANCOREADY) IVPB 750 mg/150 mL  Status:  Discontinued        750 mg 150 mL/hr over 60 Minutes Intravenous  Once 04/22/23 2045 04/23/23 0912   04/22/23 2046  vancomycin variable dose per unstable renal function (pharmacist dosing)  Status:  Discontinued         Does not apply See admin instructions  04/22/23 2046 04/23/23 0912   04/22/23 2045  vancomycin (VANCOCIN) IVPB 1000 mg/200 mL premix  Status:  Discontinued        1,000 mg 200 mL/hr over 60 Minutes Intravenous  Once 04/22/23 2033 04/22/23 2037   04/22/23 2043  vancomycin variable dose per unstable renal function (pharmacist dosing)  Status:  Discontinued         Does not apply See admin instructions 04/22/23 2045 04/22/23 2046   04/22/23 1745  ceFEPIme (MAXIPIME) 2 g in  sodium chloride 0.9 % 100 mL IVPB        2 g 200 mL/hr over 30 Minutes Intravenous  Once 04/22/23 1742 04/22/23 1854   04/22/23 1745  vancomycin (VANCOCIN) IVPB 1000 mg/200 mL premix        1,000 mg 200 mL/hr over 60 Minutes Intravenous  Once 04/22/23 1742 04/22/23 1954           Data Reviewed:  I have personally reviewed the following...  CBC: Recent Labs  Lab 04/22/23 1732 04/23/23 0450  WBC 15.2* 8.2  NEUTROABS 12.8*  --   HGB 13.3 10.4*  HCT 40.4 30.7*  MCV 95.3 94.2  PLT 278 175   Basic Metabolic Panel: Recent Labs  Lab 04/22/23 1732 04/23/23 0450 04/24/23 0504  NA 138 139 140  K 5.4* 4.3 4.2  CL 105 109 110  CO2 18* 22 22  GLUCOSE 210* 125* 99  BUN 53* 41* 29*  CREATININE 3.73* 2.93* 2.03*  CALCIUM 9.2 7.9* 8.0*   GFR: Estimated Creatinine Clearance: 27.3 mL/min (A) (by C-G formula based on SCr of 2.03 mg/dL (H)). Liver Function Tests: Recent Labs  Lab 04/22/23 1732  AST 22  ALT 16  ALKPHOS 57  BILITOT 0.8  PROT 7.7  ALBUMIN 3.4*   No results for input(s): "LIPASE", "AMYLASE" in the last 168 hours. No results for input(s): "AMMONIA" in the last 168 hours. Coagulation Profile: Recent Labs  Lab 04/22/23 1732 04/23/23 0450  INR 1.2 1.3*   Cardiac Enzymes: No results for input(s): "CKTOTAL", "CKMB", "CKMBINDEX", "TROPONINI" in the last 168 hours. BNP (last 3 results) No results for input(s): "PROBNP" in the last 8760 hours. HbA1C: No results for input(s): "HGBA1C" in the last 72 hours. CBG: Recent Labs  Lab  04/23/23 1128 04/23/23 1646 04/23/23 2115 04/24/23 0745 04/24/23 1120  GLUCAP 114* 124* 117* 92 113*   Lipid Profile: No results for input(s): "CHOL", "HDL", "LDLCALC", "TRIG", "CHOLHDL", "LDLDIRECT" in the last 72 hours. Thyroid Function Tests: No results for input(s): "TSH", "T4TOTAL", "FREET4", "T3FREE", "THYROIDAB" in the last 72 hours. Anemia Panel: No results for input(s): "VITAMINB12", "FOLATE", "FERRITIN", "TIBC", "IRON", "RETICCTPCT" in the last 72 hours. Most Recent Urinalysis On File:     Component Value Date/Time   COLORURINE YELLOW (A) 04/22/2023 2232   APPEARANCEUR CLOUDY (A) 04/22/2023 2232   LABSPEC 1.018 04/22/2023 2232   PHURINE 5.0 04/22/2023 2232   GLUCOSEU NEGATIVE 04/22/2023 2232   HGBUR MODERATE (A) 04/22/2023 2232   BILIRUBINUR NEGATIVE 04/22/2023 2232   KETONESUR NEGATIVE 04/22/2023 2232   PROTEINUR 30 (A) 04/22/2023 2232   NITRITE NEGATIVE 04/22/2023 2232   LEUKOCYTESUR LARGE (A) 04/22/2023 2232   Sepsis Labs: @LABRCNTIP (procalcitonin:4,lacticidven:4) Microbiology: Recent Results (from the past 240 hours)  Culture, blood (Routine x 2)     Status: None (Preliminary result)   Collection Time: 04/22/23  5:20 PM   Specimen: BLOOD  Result Value Ref Range Status   Specimen Description   Final    BLOOD RAC Performed at [facility], [address], [place], [place] [address]    Special Requests   Final    BOTTLES DRAWN AEROBIC AND ANAEROBIC Blood Culture results may not be optimal due to an inadequate volume of blood received in culture bottles Performed at [facility], [address] [place], [place] [address]    Culture  Setup Time   Final    ANAEROBIC BOTTLE ONLY GRAM POSITIVE COCCI CRITICAL RESULT CALLED TO, READ BACK BY AND VERIFIED WITH: TREY  GREENWOOD @0736  04/23/23 MJU    Culture   Final    GRAM POSITIVE COCCI IDENTIFICATION TO FOLLOW Performed at [facility], [address], [place], [place] [address]     Report Status PENDING  Incomplete  Blood Culture ID Panel (Reflexed)     Status: Abnormal   Collection Time: 04/22/23  5:20 PM  Result Value Ref Range Status   Enterococcus faecalis NOT DETECTED NOT DETECTED Final   Enterococcus Faecium NOT DETECTED NOT DETECTED Final   Listeria monocytogenes NOT DETECTED NOT DETECTED Final   Staphylococcus species NOT DETECTED NOT DETECTED Final   Staphylococcus aureus (BCID) NOT DETECTED NOT DETECTED Final   Staphylococcus epidermidis NOT DETECTED NOT DETECTED Final   Staphylococcus lugdunensis NOT DETECTED NOT DETECTED Final   Streptococcus species DETECTED (A) NOT DETECTED Final    Comment: Not Enterococcus species, Streptococcus agalactiae, Streptococcus pyogenes, or Streptococcus pneumoniae. CRITICAL RESULT CALLED TO, READ BACK BY AND VERIFIED WITH: TREY GREENWOOD @0736  04/23/23 MJU    Streptococcus agalactiae NOT DETECTED NOT DETECTED Final   Streptococcus pneumoniae NOT DETECTED NOT DETECTED Final   Streptococcus pyogenes NOT DETECTED NOT DETECTED Final   A.calcoaceticus-baumannii NOT DETECTED NOT DETECTED Final   Bacteroides fragilis NOT DETECTED NOT DETECTED Final   Enterobacterales NOT DETECTED NOT DETECTED Final   Enterobacter cloacae complex NOT DETECTED NOT DETECTED Final   Escherichia coli NOT DETECTED NOT DETECTED Final   Klebsiella aerogenes NOT DETECTED NOT DETECTED Final   Klebsiella oxytoca NOT DETECTED NOT DETECTED Final   Klebsiella pneumoniae NOT DETECTED NOT DETECTED Final   Proteus species NOT DETECTED NOT DETECTED Final   Salmonella species NOT DETECTED NOT DETECTED Final   Serratia marcescens NOT DETECTED NOT DETECTED Final   Haemophilus influenzae NOT DETECTED NOT DETECTED Final   Neisseria meningitidis NOT DETECTED NOT DETECTED Final   Pseudomonas aeruginosa NOT DETECTED NOT DETECTED Final   Stenotrophomonas maltophilia NOT DETECTED NOT DETECTED Final   Candida albicans NOT DETECTED NOT DETECTED Final   Candida auris NOT  DETECTED NOT DETECTED Final   Candida glabrata NOT DETECTED NOT DETECTED Final   Candida krusei NOT DETECTED NOT DETECTED Final   Candida parapsilosis NOT DETECTED NOT DETECTED Final   Candida tropicalis NOT DETECTED NOT DETECTED Final   Cryptococcus neoformans/gattii NOT DETECTED NOT DETECTED Final    Comment: Performed at [facility], [address] [place], [place] [address]  Culture, blood (Routine x 2)     Status: None (Preliminary result)   Collection Time: 04/22/23  6:37 PM   Specimen: BLOOD LEFT HAND  Result Value Ref Range Status   Specimen Description BLOOD LEFT HAND AEROBIC BOTTLE ONLY  Final   Special Requests   Final    AEROBIC BOTTLE ONLY Blood Culture results may not be optimal due to an inadequate volume of blood received in culture bottles   Culture   Final    NO GROWTH 2 DAYS Performed at [facility], [address] [place], [place] [address]    Report Status PENDING  Incomplete      Radiology Studies last 3 days: DG Chest Port 1 View Result Date: 04/22/2023 CLINICAL DATA:  Sepsis.  Altered mental status. EXAM: PORTABLE CHEST 1 VIEW COMPARISON:  March 27, 2023. FINDINGS: Stable cardiomegaly. Left lung is clear. Minimal right basilar opacity is noted concerning for subsegmental atelectasis and possible small effusion. Bony thorax is unremarkable. IMPRESSION: Minimal right basilar opacity is noted concerning for subsegmental atelectasis and possible small right pleural effusion.  Electronically Signed   By: Lupita Raider M.D.   On: 04/22/2023 17:41        Sunnie Nielsen, DO [facility] 04/24/2023, 2:26 PM    Dictation software may have been used to generate the above note. Typos may occur and escape review in typed/dictated notes. Please contact Dr Lyn Hollingshead directly for clarity if needed.  Staff may message me via secure chat in Epic  but this may not receive an immediate response,  please [place] me for urgent matters!  If  7PM-7AM, please contact night coverage [email or web address]

## 2023-04-25 DIAGNOSIS — A419 Sepsis, unspecified organism: Secondary | ICD-10-CM | POA: Diagnosis not present

## 2023-04-25 LAB — BASIC METABOLIC PANEL WITH GFR
Anion gap: 7 (ref 5–15)
BUN: 21 mg/dL (ref 8–23)
CO2: 24 mmol/L (ref 22–32)
Calcium: 7.9 mg/dL — ABNORMAL LOW (ref 8.9–10.3)
Chloride: 108 mmol/L (ref 98–111)
Creatinine, Ser: 1.61 mg/dL — ABNORMAL HIGH (ref 0.61–1.24)
GFR, Estimated: 43 mL/min — ABNORMAL LOW (ref >60)
Glucose, Bld: 105 mg/dL — ABNORMAL HIGH (ref 70–99)
Potassium: 4.3 mmol/L (ref 3.5–5.1)
Sodium: 139 mmol/L (ref 135–145)

## 2023-04-25 LAB — CULTURE, BLOOD (ROUTINE X 2)

## 2023-04-25 LAB — GLUCOSE, CAPILLARY
Glucose-Capillary: 92 mg/dL (ref 70–99)
Glucose-Capillary: 126 mg/dL — ABNORMAL HIGH (ref 70–99)
Glucose-Capillary: 93 mg/dL (ref 70–99)
Glucose-Capillary: 112 mg/dL — ABNORMAL HIGH (ref 70–99)

## 2023-04-25 NOTE — Progress Notes (Signed)
 PROGRESS NOTE    Derek Lynch   ZOX:[id number] DOB: January 28, 1940  DOA: 04/22/2023 Date of Service: 04/25/23 which is hospital day 3  PCP: Cheryll Dessert, [facility] course / significant events:   HPI: Derek Lynch is a 83 y.o. Caucasian male with medical history significant for osteoarthritis, chronic venous insufficiency, HFpEF, DM2, HTN, HLD. He presented to the emergency room with acute onset of generalized weakness. He was discharged from rehab about 3 days prior, but continued to get weaker w/ hard time getting out of his chair today.  He admits to urinary frequency and dysuria, mild dyspnea   Of note, recent admission 03/07-03/17 for influenza A w/ hypoxia initially requiring BiPAP, rhabdomyolysis, AKI. D/c to SNF.   04/03: to ED, hypotension, tachycardia, tachypnea, elevated WBC, elevated lactic, mild hyperkalemia 5.4, AKI w/ Cr 3.73. CXR question R pleural effusion. Started on IV cefepime, vancomycin, IV fluids. UA concern for UTI.  04/04: improved WBC, Cr, K, VS. BCx(+) strep, pending ID/susceptibilities, deescalate abx to rocephin only for now. Awaiting culture results.  04/05: UCx never sent. BCx pending ID. AKI improving.  04/06: AKI continues to improve. BCx (+) strep vestibularis d/w ID, plan echo and repeat BCx tomorrow, cont IV abx       Consultants:  none  Procedures/Surgeries: none      ASSESSMENT & PLAN:   Sepsis due to undetermined organism  Severe sepsis (AKI)  Urinary Tract Infection BCx(+) strep vestibularis Deescalate abx to ceftriaxone only for now  Echo Repeat BCx tomorrow  Monitor sepsis parameters --> improved VS and WBC    AKI (acute kidney injury) - improving  Baseline GFR >60 no CKD likely prerenal due to volume depletion and dehydration. Associated with mild metabolic acidosis which has resolved. Avoid nephrotoxins Treat UTI  Monitor BMP Expect UCx may be negative since specimen obtained following initiation of abx    Type  2 diabetes mellitus without complications  SSI continue Actos.   Essential hypertension Holding home lisinopril, lasix given AKI and soft BP   Dyslipidemia continue statin      overweight based on BMI: Body mass index is 29.18 kg/m.  Underweight - under 18  overweight - 25 to 29 obese - 30 or more Class 1 obesity: BMI of 30.0 to 34 Class 2 obesity: BMI of 35.0 to 39 Class 3 obesity: BMI of 40.0 to 49 Super Morbid Obesity: BMI 50-59 Super-super Morbid Obesity: BMI 60+ Significantly low or high BMI is associated with higher medical risk.  Weight management advised as adjunct to other disease management and risk reduction treatments   DVT prophylaxis: heparin IV fluids: can d/c continuous IV fluids once taking po Nutrition: carb modified diet Central lines / other devices: none  Code Status: DNR ACP documentation reviewed:  none on file in [facility]  TOC needs: TBD Medical barriers to dispo: Echo, repeat BCx. Expected medical readiness for discharge 2+ days.       Subjective / Brief ROS:  Patient reports no concerns at this time Denies CP/SOB.  Pain controlled.  Denies new weakness.  Tolerating diet.   Family Communication: none at this time     Objective Findings:  Vitals:   04/24/23 1504 04/24/23 2100 04/25/23 0309 04/25/23 0826  BP: 118/75 130/69 (!) 140/76 133/76  Pulse: 81 77 79 82  Resp: 19 20 20 18   Temp: 98.5 F (36.9 C) 98 F (36.7 C) 97.8 F (36.6 C) 98 F (36.7 C)  TempSrc:  Oral Oral  SpO2: 100% 100% 100% 100%  Weight:      Height:        Intake/Output Summary (Last 24 hours) at 04/25/2023 1614 Last data filed at 04/25/2023 1542 Gross per 24 hour  Intake 720 ml  Output 900 ml  Net -180 ml   Filed Weights   04/22/23 1700  Weight: 77.1 kg    Examination:  Physical Exam Constitutional:      General: He is not in acute distress. Cardiovascular:     Rate and Rhythm: Normal rate and regular rhythm.  Pulmonary:     Effort: Pulmonary  effort is normal.     Breath sounds: Normal breath sounds.  Abdominal:     General: Abdomen is flat.  Skin:    General: Skin is warm and dry.  Neurological:     Mental Status: He is alert.  Psychiatric:        Mood and Affect: Mood normal.        Behavior: Behavior normal.          Scheduled Medications:   vitamin C  1,000 mg Oral Daily   aspirin EC  81 mg Oral Daily   cholecalciferol  1,000 Units Oral Daily   heparin injection (subcutaneous)  5,000 Units Subcutaneous Q8H   insulin aspart  0-15 Units Subcutaneous TID WC   insulin aspart  0-5 Units Subcutaneous QHS   pioglitazone  15 mg Oral Daily   pravastatin  20 mg Oral q1800    Continuous Infusions:  cefTRIAXone (ROCEPHIN)  IV 2 g (04/24/23 1837)    PRN Medications:  acetaminophen **OR** acetaminophen, magnesium hydroxide, ondansetron **OR** ondansetron (ZOFRAN) IV, traZODone  Antimicrobials from admission:  Anti-infectives (From admission, onward)    Start     Dose/Rate Route Frequency Ordered Stop   04/23/23 2000  ceFEPIme (MAXIPIME) 2 g in sodium chloride 0.9 % 100 mL IVPB  Status:  Discontinued        2 g 200 mL/hr over 30 Minutes Intravenous  Once 04/22/23 2033 04/22/23 2045   04/23/23 2000  ceFEPIme (MAXIPIME) 2 g in sodium chloride 0.9 % 100 mL IVPB  Status:  Discontinued        2 g 200 mL/hr over 30 Minutes Intravenous Every 24 hours 04/22/23 2045 04/23/23 0912   04/23/23 1800  cefTRIAXone (ROCEPHIN) 2 g in sodium chloride 0.9 % 100 mL IVPB        2 g 200 mL/hr over 30 Minutes Intravenous Every 24 hours 04/23/23 0912     04/22/23 2100  metroNIDAZOLE (FLAGYL) IVPB 500 mg  Status:  Discontinued        500 mg 100 mL/hr over 60 Minutes Intravenous Every 12 hours 04/22/23 2033 04/23/23 0912   04/22/23 2100  vancomycin (VANCOREADY) IVPB 750 mg/150 mL  Status:  Discontinued        750 mg 150 mL/hr over 60 Minutes Intravenous  Once 04/22/23 2045 04/23/23 0912   04/22/23 2046  vancomycin variable dose per  unstable renal function (pharmacist dosing)  Status:  Discontinued         Does not apply See admin instructions 04/22/23 2046 04/23/23 0912   04/22/23 2045  vancomycin (VANCOCIN) IVPB 1000 mg/200 mL premix  Status:  Discontinued        1,000 mg 200 mL/hr over 60 Minutes Intravenous  Once 04/22/23 2033 04/22/23 2037   04/22/23 2043  vancomycin variable dose per unstable renal function (pharmacist dosing)  Status:  Discontinued  Does not apply See admin instructions 04/22/23 2045 04/22/23 2046   04/22/23 1745  ceFEPIme (MAXIPIME) 2 g in sodium chloride 0.9 % 100 mL IVPB        2 g 200 mL/hr over 30 Minutes Intravenous  Once 04/22/23 1742 04/22/23 1854   04/22/23 1745  vancomycin (VANCOCIN) IVPB 1000 mg/200 mL premix        1,000 mg 200 mL/hr over 60 Minutes Intravenous  Once 04/22/23 1742 04/22/23 1954           Data Reviewed:  I have personally reviewed the following...  CBC: Recent Labs  Lab 04/22/23 1732 04/23/23 0450  WBC 15.2* 8.2  NEUTROABS 12.8*  --   HGB 13.3 10.4*  HCT 40.4 30.7*  MCV 95.3 94.2  PLT 278 175   Basic Metabolic Panel: Recent Labs  Lab 04/22/23 1732 04/23/23 0450 04/24/23 0504 04/25/23 0516  NA 138 139 140 139  K 5.4* 4.3 4.2 4.3  CL 105 109 110 108  CO2 18* 22 22 24   GLUCOSE 210* 125* 99 105*  BUN 53* 41* 29* 21  CREATININE 3.73* 2.93* 2.03* 1.61*  CALCIUM 9.2 7.9* 8.0* 7.9*   GFR: Estimated Creatinine Clearance: 34.4 mL/min (A) (by C-G formula based on SCr of 1.61 mg/dL (H)). Liver Function Tests: Recent Labs  Lab 04/22/23 1732  AST 22  ALT 16  ALKPHOS 57  BILITOT 0.8  PROT 7.7  ALBUMIN 3.4*   No results for input(s): "LIPASE", "AMYLASE" in the last 168 hours. No results for input(s): "AMMONIA" in the last 168 hours. Coagulation Profile: Recent Labs  Lab 04/22/23 1732 04/23/23 0450  INR 1.2 1.3*   Cardiac Enzymes: No results for input(s): "CKTOTAL", "CKMB", "CKMBINDEX", "TROPONINI" in the last 168 hours. BNP  (last 3 results) No results for input(s): "PROBNP" in the last 8760 hours. HbA1C: No results for input(s): "HGBA1C" in the last 72 hours. CBG: Recent Labs  Lab 04/24/23 1120 04/24/23 1813 04/24/23 2139 04/25/23 0825 04/25/23 1204  GLUCAP 113* 138* 80 92 126*   Lipid Profile: No results for input(s): "CHOL", "HDL", "LDLCALC", "TRIG", "CHOLHDL", "LDLDIRECT" in the last 72 hours. Thyroid Function Tests: No results for input(s): "TSH", "T4TOTAL", "FREET4", "T3FREE", "THYROIDAB" in the last 72 hours. Anemia Panel: No results for input(s): "VITAMINB12", "FOLATE", "FERRITIN", "TIBC", "IRON", "RETICCTPCT" in the last 72 hours. Most Recent Urinalysis On File:     Component Value Date/Time   COLORURINE YELLOW (A) 04/22/2023 2232   APPEARANCEUR CLOUDY (A) 04/22/2023 2232   LABSPEC 1.018 04/22/2023 2232   PHURINE 5.0 04/22/2023 2232   GLUCOSEU NEGATIVE 04/22/2023 2232   HGBUR MODERATE (A) 04/22/2023 2232   BILIRUBINUR NEGATIVE 04/22/2023 2232   KETONESUR NEGATIVE 04/22/2023 2232   PROTEINUR 30 (A) 04/22/2023 2232   NITRITE NEGATIVE 04/22/2023 2232   LEUKOCYTESUR LARGE (A) 04/22/2023 2232   Sepsis Labs: @LABRCNTIP (procalcitonin:4,lacticidven:4) Microbiology: Recent Results (from the past 240 hours)  Culture, blood (Routine x 2)     Status: Abnormal   Collection Time: 04/22/23  5:20 PM   Specimen: BLOOD  Result Value Ref Range Status   Specimen Description   Final    BLOOD RAC Performed at [facility], [address] [place], [place] [address]    Special Requests   Final    BOTTLES DRAWN AEROBIC AND ANAEROBIC Blood Culture results may not be optimal due to an inadequate volume of blood received in culture bottles Performed at [facility], [address] [place], [place] [address]  Culture  Setup Time   Final    ANAEROBIC BOTTLE ONLY GRAM POSITIVE COCCI CRITICAL RESULT CALLED TO, READ BACK BY AND VERIFIED WITH: TREY GREENWOOD @0736  04/23/23  MJU Performed at [facility], [address], [place], [place] [address]    Culture STREPTOCOCCUS VESTIBULARIS (A)  Final   Report Status 04/25/2023 FINAL  Final   Organism ID, Bacteria STREPTOCOCCUS VESTIBULARIS  Final      Susceptibility   Streptococcus vestibularis - MIC*    PENICILLIN <=0.06 SENSITIVE Sensitive     CEFTRIAXONE <=0.12 SENSITIVE Sensitive     ERYTHROMYCIN <=0.12 SENSITIVE Sensitive     LEVOFLOXACIN 2 SENSITIVE Sensitive     VANCOMYCIN 1 SENSITIVE Sensitive     * STREPTOCOCCUS VESTIBULARIS  Blood Culture ID Panel (Reflexed)     Status: Abnormal   Collection Time: 04/22/23  5:20 PM  Result Value Ref Range Status   Enterococcus faecalis NOT DETECTED NOT DETECTED Final   Enterococcus Faecium NOT DETECTED NOT DETECTED Final   Listeria monocytogenes NOT DETECTED NOT DETECTED Final   Staphylococcus species NOT DETECTED NOT DETECTED Final   Staphylococcus aureus (BCID) NOT DETECTED NOT DETECTED Final   Staphylococcus epidermidis NOT DETECTED NOT DETECTED Final   Staphylococcus lugdunensis NOT DETECTED NOT DETECTED Final   Streptococcus species DETECTED (A) NOT DETECTED Final    Comment: Not Enterococcus species, Streptococcus agalactiae, Streptococcus pyogenes, or Streptococcus pneumoniae. CRITICAL RESULT CALLED TO, READ BACK BY AND VERIFIED WITH: TREY GREENWOOD @0736  04/23/23 MJU    Streptococcus agalactiae NOT DETECTED NOT DETECTED Final   Streptococcus pneumoniae NOT DETECTED NOT DETECTED Final   Streptococcus pyogenes NOT DETECTED NOT DETECTED Final   A.calcoaceticus-baumannii NOT DETECTED NOT DETECTED Final   Bacteroides fragilis NOT DETECTED NOT DETECTED Final   Enterobacterales NOT DETECTED NOT DETECTED Final   Enterobacter cloacae complex NOT DETECTED NOT DETECTED Final   Escherichia coli NOT DETECTED NOT DETECTED Final   Klebsiella aerogenes NOT DETECTED NOT DETECTED Final   Klebsiella oxytoca NOT DETECTED NOT DETECTED Final   Klebsiella pneumoniae NOT  DETECTED NOT DETECTED Final   Proteus species NOT DETECTED NOT DETECTED Final   Salmonella species NOT DETECTED NOT DETECTED Final   Serratia marcescens NOT DETECTED NOT DETECTED Final   Haemophilus influenzae NOT DETECTED NOT DETECTED Final   Neisseria meningitidis NOT DETECTED NOT DETECTED Final   Pseudomonas aeruginosa NOT DETECTED NOT DETECTED Final   Stenotrophomonas maltophilia NOT DETECTED NOT DETECTED Final   Candida albicans NOT DETECTED NOT DETECTED Final   Candida auris NOT DETECTED NOT DETECTED Final   Candida glabrata NOT DETECTED NOT DETECTED Final   Candida krusei NOT DETECTED NOT DETECTED Final   Candida parapsilosis NOT DETECTED NOT DETECTED Final   Candida tropicalis NOT DETECTED NOT DETECTED Final   Cryptococcus neoformans/gattii NOT DETECTED NOT DETECTED Final    Comment: Performed at [facility], [address] [place], [place] [address]  Culture, blood (Routine x 2)     Status: None (Preliminary result)   Collection Time: 04/22/23  6:37 PM   Specimen: BLOOD LEFT HAND  Result Value Ref Range Status   Specimen Description BLOOD LEFT HAND AEROBIC BOTTLE ONLY  Final   Special Requests   Final    AEROBIC BOTTLE ONLY Blood Culture results may not be optimal due to an inadequate volume of blood received in culture bottles   Culture   Final    NO GROWTH 3 DAYS Performed at [facility], [address] [place], [place]  [address]    Report Status PENDING  Incomplete  Urine Culture (for pregnant, neutropenic or urologic patients or patients with an indwelling urinary catheter)     Status: Abnormal (Preliminary result)   Collection Time: 04/24/23 11:25 AM   Specimen: Urine, Clean Catch  Result Value Ref Range Status   Specimen Description   Final    URINE, CLEAN CATCH Performed at [facility], [address] [place], [place] [address]    Special Requests   Final    NONE Performed at [facility], [address],  [place], [place] [address]    Culture (A)  Final    >=100,000 COLONIES/mL ENTEROCOCCUS FAECALIS CULTURE REINCUBATED FOR BETTER GROWTH Performed at [facility], [address], [place], [place] [address]    Report Status PENDING  Incomplete      Radiology Studies last 3 days: DG Chest Port 1 View Result Date: 04/22/2023 CLINICAL DATA:  Sepsis.  Altered mental status. EXAM: PORTABLE CHEST 1 VIEW COMPARISON:  March 27, 2023. FINDINGS: Stable cardiomegaly. Left lung is clear. Minimal right basilar opacity is noted concerning for subsegmental atelectasis and possible small effusion. Bony thorax is unremarkable. IMPRESSION: Minimal right basilar opacity is noted concerning for subsegmental atelectasis and possible small right pleural effusion. Electronically Signed   By: Lupita Raider M.D.   On: 04/22/2023 17:41        Sunnie Nielsen, DO [facility] 04/25/2023, 4:14 PM    Dictation software may have been used to generate the above note. Typos may occur and escape review in typed/dictated notes. Please contact Dr Lyn Hollingshead directly for clarity if needed.  Staff may message me via secure chat in Epic  but this may not receive an immediate response,  please Silverman me for urgent matters!  If 7PM-7AM, please contact night coverage [email or web address]

## 2023-04-26 ENCOUNTER — Inpatient Hospital Stay (HOSPITAL_COMMUNITY)
Admit: 2023-04-26 | Discharge: 2023-04-26 | Disposition: A | Discharge status: Home / Self Care | Attending: Osteopathic Medicine

## 2023-04-26 DIAGNOSIS — R7881 Bacteremia: Secondary | ICD-10-CM

## 2023-04-26 DIAGNOSIS — N179 Acute kidney failure, unspecified: Secondary | ICD-10-CM | POA: Diagnosis not present

## 2023-04-26 DIAGNOSIS — B954 Other streptococcus as the cause of diseases classified elsewhere: Secondary | ICD-10-CM | POA: Diagnosis not present

## 2023-04-26 DIAGNOSIS — R0609 Other forms of dyspnea: Secondary | ICD-10-CM | POA: Diagnosis not present

## 2023-04-26 DIAGNOSIS — A419 Sepsis, unspecified organism: Secondary | ICD-10-CM | POA: Diagnosis not present

## 2023-04-26 LAB — URINE CULTURE: Culture: >=100000 — AB

## 2023-04-26 LAB — GLUCOSE, CAPILLARY
Glucose-Capillary: 97 mg/dL (ref 70–99)
Glucose-Capillary: 130 mg/dL — ABNORMAL HIGH (ref 70–99)
Glucose-Capillary: 101 mg/dL — ABNORMAL HIGH (ref 70–99)
Glucose-Capillary: 104 mg/dL — ABNORMAL HIGH (ref 70–99)

## 2023-04-26 LAB — BASIC METABOLIC PANEL WITH GFR
Anion gap: 12 (ref 5–15)
BUN: 13 mg/dL (ref 8–23)
CO2: 20 mmol/L — ABNORMAL LOW (ref 22–32)
Calcium: 7.9 mg/dL — ABNORMAL LOW (ref 8.9–10.3)
Chloride: 106 mmol/L (ref 98–111)
Creatinine, Ser: 1.52 mg/dL — ABNORMAL HIGH (ref 0.61–1.24)
GFR, Estimated: 46 mL/min — ABNORMAL LOW (ref >60)
Glucose, Bld: 90 mg/dL (ref 70–99)
Potassium: 3.8 mmol/L (ref 3.5–5.1)
Sodium: 138 mmol/L (ref 135–145)

## 2023-04-26 NOTE — Evaluation (Signed)
 Physical Therapy Evaluation Patient Details Name: Derek Lynch MRN: [id number] DOB: January 28, 1940 Today's Date: 04/26/2023  History of Present Illness  Derek Lynch is a 83 y.o. male with medical history significant of HTN, HLD, IIDM, chronic ambulatory dysfunction, multiple always, presented with 2 days of cough shortness of breath wheezing malaise weakness and fall.  Clinical Impression  Patient alert, oriented to self and hospital, but questionable historian. Per chart pt was recently at SNF, had been home for 3 days and unable to care for himself. Supine to sit with HOB elevated, use of bed rails, minA. Pt needed maximum verbal encouragement and handheld assist, often reports he cannot, prior to trying. Sit <> Stand with RW and min-modAx2 with maximum verbal encouragement as well. Step pivot to recliner min-modAx2 and RW, very shuffled step, and pt reported significant fatigue with activity.  Overall the patient demonstrated deficits (see "PT Problem List") that impede the patient's functional abilities, safety, and mobility and would benefit from skilled PT intervention.          If plan is discharge home, recommend the following: Two people to help with walking and/or transfers;Two people to help with bathing/dressing/bathroom;Help with stairs or ramp for entrance;Assist for transportation;Assistance with cooking/housework   Can travel by private vehicle   No    Equipment Recommendations Other (comment) (defer to next level of care)  Recommendations for Other Services       Functional Status Assessment Patient has had a recent decline in their functional status and demonstrates the ability to make significant improvements in function in a reasonable and predictable amount of time.     Precautions / Restrictions Precautions Precautions: Fall Recall of Precautions/Restrictions: Impaired Restrictions Weight Bearing Restrictions Per Provider Order: No      Mobility  Bed  Mobility Overal bed mobility: Needs Assistance Bed Mobility: Supine to Sit     Supine to sit: Min assist, HOB elevated, Used rails     General bed mobility comments: extra time, handheld assist provided and max encouragement for pt to maximize participation    Transfers Overall transfer level: Needs assistance Equipment used: Rolling walker (2 wheels) Transfers: Sit to/from Stand, Bed to chair/wheelchair/BSC Sit to Stand: Min assist, +2 safety/equipment   Step pivot transfers: Min assist, Mod assist, +2 physical assistance       General transfer comment: pt with very poor foot clearance, noted for shuffled steps and socks appear to be slipping off. steadying assist and RW assistance needed    Ambulation/Gait                  Stairs            Wheelchair Mobility     Tilt Bed    Modified Rankin (Stroke Patients Only)       Balance Overall balance assessment: Needs assistance Sitting-balance support: Feet supported, Bilateral upper extremity supported Sitting balance-Leahy Scale: Fair     Standing balance support: Bilateral upper extremity supported, During functional activity, Reliant on assistive device for balance Standing balance-Leahy Scale: Poor                               Pertinent Vitals/Pain Pain Assessment Pain Assessment: Faces Faces Pain Scale: Hurts a little bit Pain Location: pt vocalizes "ow" with all movements but does not quantify or localize Pain Descriptors / Indicators: Guarding, Grimacing Pain Intervention(s): Limited activity within patient's tolerance, Monitored during session, Repositioned  Home Living Family/patient expects to be discharged to:: Private residence Living Arrangements: Alone   Type of Home: Apartment Home Access: Level entry       Home Layout: One level        Prior Function Prior Level of Function : Patient poor historian/Family not available             Mobility Comments:  per pt at rehab he was working on walking, but definitely questionable historian       Extremity/Trunk Assessment   Upper Extremity Assessment Upper Extremity Assessment: Generalized weakness    Lower Extremity Assessment Lower Extremity Assessment: Generalized weakness (able to lift against gravity)       Communication        Cognition Arousal: Alert Behavior During Therapy: Flat affect   PT - Cognitive impairments: No family/caregiver present to determine baseline                       PT - Cognition Comments: oriented to name, hospital Following commands: Impaired Following commands impaired: Follows one step commands with increased time     Cueing Cueing Techniques: Verbal cues, Tactile cues     General Comments      Exercises     Assessment/Plan    PT Assessment Patient needs continued PT services  PT Problem List Decreased strength;Decreased activity tolerance;Decreased balance;Decreased mobility;Decreased knowledge of use of DME;Decreased safety awareness       PT Treatment Interventions DME instruction;Gait training;Stair training;Functional mobility training;Therapeutic activities;Therapeutic exercise;Balance training;Neuromuscular re-education    PT Goals (Current goals can be found in the Care Plan section)  Acute Rehab PT Goals Patient Stated Goal: did not state PT Goal Formulation: With patient Time For Goal Achievement: 05/10/23 Potential to Achieve Goals: Fair    Frequency Min 1X/week     Co-evaluation PT/OT/SLP Co-Evaluation/Treatment: Yes Reason for Co-Treatment: Necessary to address cognition/behavior during functional activity;For patient/therapist safety;To address functional/ADL transfers PT goals addressed during session: Mobility/safety with mobility;Proper use of DME OT goals addressed during session: ADL's and self-care       AM-PAC PT "6 Clicks" Mobility  Outcome Measure Help needed turning from your back to your  side while in a flat bed without using bedrails?: A Little Help needed moving from lying on your back to sitting on the side of a flat bed without using bedrails?: A Lot Help needed moving to and from a bed to a chair (including a wheelchair)?: A Lot Help needed standing up from a chair using your arms (e.g., wheelchair or bedside chair)?: A Lot Help needed to walk in hospital room?: A Lot Help needed climbing 3-5 steps with a railing? : Total 6 Click Score: 12    End of Session Equipment Utilized During Treatment: Gait belt Activity Tolerance: Patient tolerated treatment well;Patient limited by fatigue Patient left: in chair;with chair alarm set;with call bell/phone within reach Nurse Communication: Mobility status PT Visit Diagnosis: Unsteadiness on feet (R26.81);Other abnormalities of gait and mobility (R26.89);Repeated falls (R29.6);Muscle weakness (generalized) (M62.81);History of falling (Z91.81);Difficulty in walking, not elsewhere classified (R26.2)    Time: 7846-9629 PT Time Calculation (min) (ACUTE ONLY): 14 min   Charges:   PT Evaluation $PT Eval Low Complexity: 1 Low   PT General Charges $$ ACUTE PT VISIT: 1 Visit        Olga Coaster PT, DPT 1:18 PM,04/26/23

## 2023-04-26 NOTE — Evaluation (Addendum)
 Occupational Therapy Evaluation Patient Details Name: Derek Lynch MRN: [id number] DOB: Feb 07, 1940 Today's Date: 04/26/2023   History of Present Illness   Pt is an 83 yo male that presented to ED for weakness at home, had been home from SNF for 3 days, workup for UTI, sepsis.  PMH of HFpEF, DM, HTN, HLD.     Clinical Impressions Pt alert to self, poor historian. Per chart review, pt recently at SNF and home for 3 days with inability to care for himself. Friend assists with transportation. Pt requires encouragement to participate, often reporting he can't perform a task prior to trying. Requires MIN A for bed mobility, MIN - MOD A +2 to stand with RW with max vcs, and transfers to recliner step pivot with MOD A +2 using RW, small shuffled steps with physical assist for RW mgmt. Attempted to further assess LB dressing with pt self-limiting participation, insisting he cannot. Washes face with vcs seated in recliner. Pt presents with impaired vision, cognition, balance, tolerance to activity and generalized weakness. Based on pt's cognitive and visual abilities, anticipate pt unsafe to perform med mgmt at home as he is unable to read fine print on name badge or information on wall.   Pt would benefit from skilled OT services to address noted impairments and functional limitations (see below for any additional details) in order to maximize safety and independence while minimizing falls risk and caregiver burden. Anticipate the need for follow up OT services upon acute hospital DC. Pt will require 24/7 assist due to vision and cognition deficits. Patient will benefit from continued inpatient follow up therapy, <3 hours/day.        If plan is discharge home, recommend the following:   Two people to help with walking and/or transfers;Two people to help with bathing/dressing/bathroom;Assistance with cooking/housework;Assist for transportation;Help with stairs or ramp for entrance;Direct  supervision/assist for financial management;Direct supervision/assist for medications management;Supervision due to cognitive status     Functional Status Assessment   Patient has had a recent decline in their functional status and demonstrates the ability to make significant improvements in function in a reasonable and predictable amount of time.     Equipment Recommendations   Other (comment)      Precautions/Restrictions   Precautions Precautions: Fall Recall of Precautions/Restrictions: Impaired Restrictions Weight Bearing Restrictions Per Provider Order: No Other Position/Activity Restrictions: +     Mobility Bed Mobility Overal bed mobility: Needs Assistance Bed Mobility: Supine to Sit     Supine to sit: Min assist, HOB elevated, Used rails     General bed mobility comments: extra time, handheld assist provided and max encouragement for pt to maximize participation    Transfers Overall transfer level: Needs assistance Equipment used: Rolling walker (2 wheels) Transfers: Sit to/from Stand, Bed to chair/wheelchair/BSC Sit to Stand: Min assist, +2 safety/equipment     Step pivot transfers: Min assist, Mod assist, +2 physical assistance     General transfer comment: assist for steadying, poor foot clerance      Balance Overall balance assessment: Needs assistance Sitting-balance support: Feet supported, Bilateral upper extremity supported Sitting balance-Leahy Scale: Fair     Standing balance support: Bilateral upper extremity supported, During functional activity, Reliant on assistive device for balance Standing balance-Leahy Scale: Poor                             ADL either performed or assessed with clinical judgement   ADL Overall  ADL's : Needs assistance/impaired Eating/Feeding: Sitting;Bed level;Moderate assistance  Grooming: Sitting;Wash/dry hands;Wash/dry face               Lower Body Dressing: Maximal assistance;Total  assistance;Cueing for sequencing;Cueing for safety Lower Body Dressing Details (indicate cue type and reason): attempted to assess LB dressing, pt self-limits assessment and insists on help despite attempts to encourge Toilet Transfer: +2 for physical assistance;+2 for safety/equipment;BSC/3in1;Rolling walker (2 wheels);Minimal assistance;Moderate assistance Toilet Transfer Details (indicate cue type and reason): simulated to recliner, slow shuffled steps, limited foot clearance, requires step by step cuing with RW mgmt         Functional mobility during ADLs: +2 for physical assistance;+2 for safety/equipment;Cueing for safety;Cueing for sequencing;Rolling walker (2 wheels);Moderate assistance General ADL Comments: Pt self-limits ADL and mobility performance. MAX A for LB dressing, up to MIN A to UB ADLs     Vision Ability to See in Adequate Light: 2 Moderately impaired Additional Comments: pt unable to read name badge, cannot locate items on tray or see call bell. requires hand over hand to place R hand on utensil. tactile button placed on call bell for ease of use with pt able to locate.            Pertinent Vitals/Pain Pain Assessment Pain Assessment: Faces Faces Pain Scale: Hurts a little bit Pain Location: pt vocalizes "ow" with all movements but does not quantify or localize Pain Descriptors / Indicators: Guarding, Grimacing Pain Intervention(s): Limited activity within patient's tolerance     Extremity/Trunk Assessment Upper Extremity Assessment Upper Extremity Assessment: Generalized weakness   Lower Extremity Assessment Lower Extremity Assessment: Generalized weakness   Cervical / Trunk Assessment Cervical / Trunk Assessment: Normal   Communication Communication Communication: Impaired Factors Affecting Communication: Hearing impaired   Cognition Arousal: Alert Behavior During Therapy: Flat affect Cognition: Cognition impaired   Orientation impairments: Place,  Time, Situation Awareness: Intellectual awareness impaired Memory impairment (select all impairments): Short-term memory, Working [employer] fast streamer, Non-declarative long-term memory Attention impairment (select first level of impairment): Focused attention Executive functioning impairment (select all impairments): Initiation, Organization, Sequencing, Reasoning, Problem solving                   Following commands: Impaired Following commands impaired: Follows one step commands with increased time     Cueing  General Comments   Cueing Techniques: Verbal cues;Tactile cues              Home Living Family/patient expects to be discharged to:: Private residence Living Arrangements: Alone Available Help at Discharge: Friend(s);Available PRN/intermittently (friend [name] stops by) Type of Home: Apartment Home Access: Level entry     Home Layout: One level     Bathroom Shower/Tub: Tub/shower unit         Home Equipment: Agricultural consultant (2 wheels)          Prior Functioning/Environment Prior Level of Function : Patient poor historian/Family not available             Mobility Comments: per pt at rehab he was working on walking, but definitely questionable historian ADLs Comments: home only 3 days ago from rehab. poor historian.    OT Problem List: Decreased strength;Decreased activity tolerance;Decreased safety awareness;Impaired balance (sitting and/or standing);Decreased knowledge of use of DME or AE;Decreased cognition;Impaired vision/perception;Decreased knowledge of precautions   OT Treatment/Interventions: Self-care/ADL training;Therapeutic exercise;Therapeutic activities;Cognitive remediation/compensation;Energy conservation;DME and/or AE instruction;Patient/family education;Balance training      OT Goals(Current goals can be found in the care plan section)  Acute Rehab OT Goals OT Goal Formulation: Patient unable to participate in goal setting Time For Goal  Achievement: 05/10/23 Potential to Achieve Goals: Fair   OT Frequency:  Min 2X/week    Co-evaluation PT/OT/SLP Co-Evaluation/Treatment: Yes Reason for Co-Treatment: Necessary to address cognition/behavior during functional activity;For patient/therapist safety;To address functional/ADL transfers PT goals addressed during session: Mobility/safety with mobility;Proper use of DME OT goals addressed during session: ADL's and self-care      AM-PAC OT "6 Clicks" Daily Activity     Outcome Measure Help from another person eating meals?: A Little Help from another person taking care of personal grooming?: A Little Help from another person toileting, which includes using toliet, bedpan, or urinal?: A Lot Help from another person bathing (including washing, rinsing, drying)?: A Lot Help from another person to put on and taking off regular upper body clothing?: A Lot Help from another person to put on and taking off regular lower body clothing?: Total 6 Click Score: 13   End of Session Equipment Utilized During Treatment: Rolling walker (2 wheels);Gait belt Nurse Communication: Mobility status  Activity Tolerance: Patient tolerated treatment well Patient left: in chair;with call bell/phone within reach;with chair alarm set  OT Visit Diagnosis: Unsteadiness on feet (R26.81);Muscle weakness (generalized) (M62.81);History of falling (Z91.81)                Time: 8295-6213 OT Time Calculation (min): 14 min Charges:  OT General Charges $OT Visit: 1 Visit OT Evaluation $OT Eval Low Complexity: 1 Low  Shareese Macha L. Breely Panik, OTR/L  04/26/23, 3:56 PM

## 2023-04-26 NOTE — Consult Note (Signed)
 NAME: Derek Lynch  DOB: 12-13-1940  MRN: [id number]  Date/Time: 04/26/2023 3:58 PM  REQUESTING PROVIDER: Dr.Alexander Subjective:  REASON FOR CONSULT: streptococcus bacteremia ? Derek Lynch is a 83 y.o. with a history of DM, b/l venous insufficiency, HLD, recent flu A in March 2025 for which he was hopsitalized Presented to the ED on 04/22/23 with lethargy and altered mental status from home- he was recently discharged from rehab and continued to get weaker and brought in by EMS because of that He was c/o dysuria  04/22/23 16:59  BP 86/56 (L)  Temp 97.4 F (36.3 C) !  Pulse Rate 117 !  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 UA>50 wbc Recent hospitalization 3/7-3/17 for fall and diagnosed with Flu A, pneumonia, encephalopathy and CHF  Past Medical History:  Diagnosis Date   Arthritis    Hyperlipidemia    Chronic venous insufficiency Past Surgical History:  Procedure Laterality Date   COLONOSCOPY      Social History   Socioeconomic History   Marital status: Single    Spouse name: Not on file   Number of children: Not on file   Years of education: Not on file   Highest education level: Not on file  Occupational History   Not on file  Tobacco Use   Smoking status: Former   Smokeless tobacco: Never  Substance and Sexual Activity   Alcohol use: No   Drug use: No   Sexual activity: Not on file  Other Topics Concern   Not on file  Social History Narrative   Not on file   Social Drivers of Health   Financial Resource Strain: Low Risk  (10/29/2022)   Received from [facility]   Overall Financial Resource Strain (CARDIA)    Difficulty of Paying Living Expenses: Not very hard   Food Insecurity: Patient Unable To Answer (04/24/2023)   Hunger Vital Sign    Worried About Running Out of Food in the Last Year: Patient unable to answer    Ran Out of Food in the Last Year: Patient unable to answer  Transportation Needs: Patient Unable To Answer (04/24/2023)   PRAPARE - Transportation    Lack of Transportation (Medical): Patient unable to answer    Lack of Transportation (Non-Medical): Patient unable to answer  Physical Activity: Not on file  Stress: Not on file  Social Connections: Unknown (04/24/2023)   Social Connection and Isolation Panel [NHANES]    Frequency of Communication with Friends and Family: Patient unable to answer    Frequency of Social Gatherings with Friends and Family: Patient unable to answer    Attends Religious Services: Never    Database administrator or Organizations: No    Attends Banker Meetings: Never    Marital Status: Patient unable to answer  Intimate Partner Violence: Patient Unable To Answer (04/24/2023)   Humiliation, Afraid, Rape, and Kick questionnaire    Fear of Current or Ex-Partner: Patient unable to answer    Emotionally Abused: Patient unable to answer    Physically Abused: Patient unable to answer    Sexually Abused: Patient unable to  answer    Family History  Problem Relation Age of Onset   Heart attack Mother    Stroke Father    Pancreatic cancer Father    Allergies  Allergen Reactions   Penicillin G Other (See Comments)   I? Current Facility-Administered Medications  Medication Dose Route Frequency Provider Last Rate Last Admin   acetaminophen (TYLENOL) tablet 650 mg  650 mg Oral Q6H PRN Mansy, Jan A, MD       Or   acetaminophen (TYLENOL) suppository 650 mg  650 mg Rectal Q6H PRN Mansy, Jan A, MD       ascorbic acid (VITAMIN C) tablet 1,000 mg  1,000 mg Oral Daily Mansy, Jan A, MD   1,000 mg at 04/26/23 0943   aspirin EC tablet 81 mg  81 mg Oral Daily Mansy, Jan A, MD   81 mg at 04/26/23 0943    cefTRIAXone (ROCEPHIN) 2 g in sodium chloride 0.9 % 100 mL IVPB  2 g Intravenous Q24H Sunnie Nielsen, DO 200 mL/hr at 04/26/23 1529 Infusion Verify at 04/26/23 1529   cholecalciferol (VITAMIN D3) 25 MCG (1000 UNIT) tablet 1,000 Units  1,000 Units Oral Daily Mansy, Jan A, MD   1,000 Units at 04/26/23 0943   heparin injection 5,000 Units  5,000 Units Subcutaneous Q8H Sunnie Nielsen, DO   5,000 Units at 04/26/23 1306   insulin aspart (novoLOG) injection 0-15 Units  0-15 Units Subcutaneous TID WC Mansy, Jan A, MD   2 Units at 04/26/23 1305   insulin aspart (novoLOG) injection 0-5 Units  0-5 Units Subcutaneous QHS Mansy, Jan A, MD       magnesium hydroxide (MILK OF MAGNESIA) suspension 30 mL  30 mL Oral Daily PRN Mansy, Jan A, MD       ondansetron [facility]) tablet 4 mg  4 mg Oral Q6H PRN Mansy, Jan A, MD       Or   ondansetron [facility]) injection 4 mg  4 mg Intravenous Q6H PRN Mansy, Jan A, MD       pioglitazone (ACTOS) tablet 15 mg  15 mg Oral Daily Mansy, Jan A, MD   15 mg at 04/26/23 1324   pravastatin (PRAVACHOL) tablet 20 mg  20 mg Oral q1800 Mansy, Jan A, MD   20 mg at 04/25/23 1734   traZODone (DESYREL) tablet 25 mg  25 mg Oral QHS PRN Mansy, Jan A, MD         Abtx:  Anti-infectives (From admission, onward)    Start     Dose/Rate Route Frequency Ordered Stop   04/23/23 2000  ceFEPIme (MAXIPIME) 2 g in sodium chloride 0.9 % 100 mL IVPB  Status:  Discontinued        2 g 200 mL/hr over 30 Minutes Intravenous  Once 04/22/23 2033 04/22/23 2045   04/23/23 2000  ceFEPIme (MAXIPIME) 2 g in sodium chloride 0.9 % 100 mL IVPB  Status:  Discontinued        2 g 200 mL/hr over 30 Minutes Intravenous Every 24 hours 04/22/23 2045 04/23/23 0912   04/23/23 1800  cefTRIAXone (ROCEPHIN) 2 g in sodium chloride 0.9 % 100 mL IVPB        2 g 200 mL/hr over 30 Minutes Intravenous Every 24 hours 04/23/23 0912     04/22/23 2100  metroNIDAZOLE (FLAGYL) IVPB 500 mg  Status:  Discontinued        500 mg 100  mL/hr over 60 Minutes Intravenous Every 12 hours 04/22/23 2033 04/23/23 0912  04/22/23 2100  vancomycin (VANCOREADY) IVPB 750 mg/150 mL  Status:  Discontinued        750 mg 150 mL/hr over 60 Minutes Intravenous  Once 04/22/23 2045 04/23/23 0912   04/22/23 2046  vancomycin variable dose per unstable renal function (pharmacist dosing)  Status:  Discontinued         Does not apply See admin instructions 04/22/23 2046 04/23/23 0912   04/22/23 2045  vancomycin (VANCOCIN) IVPB 1000 mg/200 mL premix  Status:  Discontinued        1,000 mg 200 mL/hr over 60 Minutes Intravenous  Once 04/22/23 2033 04/22/23 2037   04/22/23 2043  vancomycin variable dose per unstable renal function (pharmacist dosing)  Status:  Discontinued         Does not apply See admin instructions 04/22/23 2045 04/22/23 2046   04/22/23 1745  ceFEPIme (MAXIPIME) 2 g in sodium chloride 0.9 % 100 mL IVPB        2 g 200 mL/hr over 30 Minutes Intravenous  Once 04/22/23 1742 04/22/23 1854   04/22/23 1745  vancomycin (VANCOCIN) IVPB 1000 mg/200 mL premix        1,000 mg 200 mL/hr over 60 Minutes Intravenous  Once 04/22/23 1742 04/22/23 1954       REVIEW OF SYSTEMS:  Limited historian Says he is okay and no to most of the questions ROS negative Except weakness Some burning while passing urine ?  Objective:  VITALS:  BP 123/71 (BP Location: Right Arm)   Pulse 85   Temp 98.4 F (36.9 C) (Oral)   Resp 16   Ht 5\' 4"  (1.626 m)   Wt 77.1 kg   SpO2 95%   BMI 29.18 kg/m   PHYSICAL EXAM:  General: Alert, oriented in self only Head: Normocephalic, without obvious abnormality, atraumatic. Eyes: Conjunctivae clear, anicteric sclerae. Pupils are equal ENT Nares normal. No drainage or sinus tenderness. Lips, mucosa, and tongue normal. No Thrush Neck: Supple, symmetrical, no adenopathy, thyroid: non tender no carotid bruit and no JVD. Back: No CVA tenderness. Lungs: Clear to auscultation bilaterally. No Wheezing or Rhonchi. No  rales. Heart: Regular rate and rhythm, no murmur, rub or gallop. Abdomen: Soft,hepatomegaly Bowel sounds normal. No masses Extremities: venous stasis and venous pigmentation of legs Skin: No rashes or lesions. Or bruising Lymph: Cervical, supraclavicular normal. Neurologic: Grossly non-focal Pertinent Labs Lab Results CBC    Component Value Date/Time   WBC 8.2 04/23/2023 0450   RBC 3.26 (L) 04/23/2023 0450   HGB 10.4 (L) 04/23/2023 0450   HCT 30.7 (L) 04/23/2023 0450   PLT 175 04/23/2023 0450   MCV 94.2 04/23/2023 0450   MCH 31.9 04/23/2023 0450   MCHC 33.9 04/23/2023 0450   RDW 15.4 04/23/2023 0450   LYMPHSABS 0.8 04/22/2023 1732   MONOABS 1.3 (H) 04/22/2023 1732   EOSABS 0.1 04/22/2023 1732   BASOSABS 0.1 04/22/2023 1732       Latest Ref Rng & Units 04/26/2023    5:57 AM 04/25/2023    5:16 AM 04/24/2023    5:04 AM  CMP  Glucose 70 - 99 mg/dL 90  213  99   BUN 8 - 23 mg/dL 13  21  29    Creatinine 0.61 - 1.24 mg/dL 0.86  5.78  4.69   Sodium 135 - 145 mmol/L 138  139  140   Potassium 3.5 - 5.1 mmol/L 3.8  4.3  4.2   Chloride 98 - 111 mmol/L 106  108  110  CO2 22 - 32 mmol/L 20  24  22    Calcium 8.9 - 10.3 mg/dL 7.9  7.9  8.0       Microbiology: Recent Results (from the past 240 hours)  Culture, blood (Routine x 2)     Status: Abnormal   Collection Time: 04/22/23  5:20 PM   Specimen: BLOOD  Result Value Ref Range Status   Specimen Description   Final    BLOOD RAC Performed at [facility], [address] [place], [place] [address]    Special Requests   Final    BOTTLES DRAWN AEROBIC AND ANAEROBIC Blood Culture results may not be optimal due to an inadequate volume of blood received in culture bottles Performed at [facility], [address] [place], [place] [address]    Culture  Setup Time   Final    ANAEROBIC BOTTLE ONLY GRAM POSITIVE COCCI CRITICAL RESULT CALLED TO, READ BACK BY AND VERIFIED WITH: TREY GREENWOOD @0736  04/23/23  MJU Performed at [facility], [address], [place], [place] [address]    Culture STREPTOCOCCUS VESTIBULARIS (A)  Final   Report Status 04/25/2023 FINAL  Final   Organism ID, Bacteria STREPTOCOCCUS VESTIBULARIS  Final      Susceptibility   Streptococcus vestibularis - MIC*    PENICILLIN <=0.06 SENSITIVE Sensitive     CEFTRIAXONE <=0.12 SENSITIVE Sensitive     ERYTHROMYCIN <=0.12 SENSITIVE Sensitive     LEVOFLOXACIN 2 SENSITIVE Sensitive     VANCOMYCIN 1 SENSITIVE Sensitive     * STREPTOCOCCUS VESTIBULARIS  Blood Culture ID Panel (Reflexed)     Status: Abnormal   Collection Time: 04/22/23  5:20 PM  Result Value Ref Range Status   Enterococcus faecalis NOT DETECTED NOT DETECTED Final   Enterococcus Faecium NOT DETECTED NOT DETECTED Final   Listeria monocytogenes NOT DETECTED NOT DETECTED Final   Staphylococcus species NOT DETECTED NOT DETECTED Final   Staphylococcus aureus (BCID) NOT DETECTED NOT DETECTED Final   Staphylococcus epidermidis NOT DETECTED NOT DETECTED Final   Staphylococcus lugdunensis NOT DETECTED NOT DETECTED Final   Streptococcus species DETECTED (A) NOT DETECTED Final    Comment: Not Enterococcus species, Streptococcus agalactiae, Streptococcus pyogenes, or Streptococcus pneumoniae. CRITICAL RESULT CALLED TO, READ BACK BY AND VERIFIED WITH: TREY GREENWOOD @0736  04/23/23 MJU    Streptococcus agalactiae NOT DETECTED NOT DETECTED Final   Streptococcus pneumoniae NOT DETECTED NOT DETECTED Final   Streptococcus pyogenes NOT DETECTED NOT DETECTED Final   A.calcoaceticus-baumannii NOT DETECTED NOT DETECTED Final   Bacteroides fragilis NOT DETECTED NOT DETECTED Final   Enterobacterales NOT DETECTED NOT DETECTED Final   Enterobacter cloacae complex NOT DETECTED NOT DETECTED Final   Escherichia coli NOT DETECTED NOT DETECTED Final   Klebsiella aerogenes NOT DETECTED NOT DETECTED Final   Klebsiella oxytoca NOT DETECTED NOT DETECTED Final   Klebsiella pneumoniae NOT  DETECTED NOT DETECTED Final   Proteus species NOT DETECTED NOT DETECTED Final   Salmonella species NOT DETECTED NOT DETECTED Final   Serratia marcescens NOT DETECTED NOT DETECTED Final   Haemophilus influenzae NOT DETECTED NOT DETECTED Final   Neisseria meningitidis NOT DETECTED NOT DETECTED Final   Pseudomonas aeruginosa NOT DETECTED NOT DETECTED Final   Stenotrophomonas maltophilia NOT DETECTED NOT DETECTED Final   Candida albicans NOT DETECTED NOT DETECTED Final   Candida auris NOT DETECTED NOT DETECTED Final   Candida glabrata NOT DETECTED NOT DETECTED Final   Candida krusei NOT DETECTED NOT DETECTED Final   Candida parapsilosis NOT  DETECTED NOT DETECTED Final   Candida tropicalis NOT DETECTED NOT DETECTED Final   Cryptococcus neoformans/gattii NOT DETECTED NOT DETECTED Final    Comment: Performed at [facility], [address] [place], [place] [address]  Culture, blood (Routine x 2)     Status: None (Preliminary result)   Collection Time: 04/22/23  6:37 PM   Specimen: BLOOD LEFT HAND  Result Value Ref Range Status   Specimen Description BLOOD LEFT HAND AEROBIC BOTTLE ONLY  Final   Special Requests   Final    AEROBIC BOTTLE ONLY Blood Culture results may not be optimal due to an inadequate volume of blood received in culture bottles   Culture   Final    NO GROWTH 4 DAYS Performed at [facility], [address] [place], [place] [address]    Report Status PENDING  Incomplete  Urine Culture (for pregnant, neutropenic or urologic patients or patients with an indwelling urinary catheter)     Status: Abnormal   Collection Time: 04/24/23 11:25 AM   Specimen: Urine, Clean Catch  Result Value Ref Range Status   Specimen Description   Final    URINE, CLEAN CATCH Performed at [facility], [address], [place], [place] [address]    Special Requests   Final    NONE Performed at [facility], [address] [place], [place] [address]     Culture 50,000 COLONIES/mL YEAST (A)  Final   Report Status 04/26/2023 FINAL  Final    IMAGING RESULTS: Cxr atelectasis I have personally reviewed the films ? Impression/Recommendation ?83 yr male presenting with weakness , hypotension, AKI after being discharged from SNF He was recently in hospital 3/7-3/17 for fall, Flu A, pneumonia, encephalopathy and CHF  Sepsis like picture on admission-   Streptococcus vestibularis bacteremia 1/4 This is an oral organism  He is edentulous  There is no obvious focus identified Could be transient bacteremia or a contaminant 2 d echo valves are okay No need for Tee Repeat culture sent ?? UTI Urine culture not sent on admission  AKI Check for incomplete bladder emptying Bladder scan   Hypertension- was low on admission- AM cortisol okay  Venous edema legs  Hepatomegaly ? US liver  Treat with antibiotic for a total of 7 days  ? ________________________________________________ Discussed with patient, requesting provider

## 2023-04-26 NOTE — Progress Notes (Signed)
 4/7 After failed attempt by Admission to obtain a signature and/or acknowledgement from patient or listed contact, another good faith attempt was made by me to obtain the patients signature or verbal consent to no avail. IMM Letter was not given.

## 2023-04-26 NOTE — Progress Notes (Signed)
*  PRELIMINARY RESULTS* Echocardiogram 2D Echocardiogram has been performed.  Cristela Blue 04/26/2023, 8:51 AM

## 2023-04-26 NOTE — Care Management Important Message (Signed)
 Important Message  Patient Details  Name: Derek Lynch MRN: [id number] Date of Birth: [birth year]   Important Message Given:  No     [name] 04/26/2023, 11:38 AM

## 2023-04-26 NOTE — Progress Notes (Signed)
 Occupational Therapy Treatment Patient Details Name: Derek Lynch MRN: [id number] DOB: [birth year] Today's Date: 04/26/2023   History of present illness Pt is an 83 yo male that presented to ED for weakness at home, had been home from SNF for 3 days, workup for UTI, sepsis.  PMH of HFpEF, DM, HTN, HLD.   OT comments  Pt received with NT in room, requesting to return to bed. Pt requires MOD A +1 to stand from recliner, and MAX A +1 to step pivot using RW back to EOB. Pt self-limits transfer, often reporting "I can't" and halting movement initiation halfway back to bed, requiring MAX A to pivot hips safely. Pt with poor foot clearance, and demos small, shuffled steps with poor RW mgmt requiring MAX multimodal cuing and OT assist to move RW during transfer. Sat upright in bed for lunch, pt requesting OT to "feed me, feed me" due to visual/cognitive deficits. Educated NT in room on strategies to support vision deficits at mealtimes, recommending +2/use of STEDY for transfers with nursing. Pt may also benefit from a chopped diet as he does not have the bimanual task performance of BUE to cut food safely and cannot see food items on plate. OT will continue to follow for functional gains. Recommending 24/7 supervision and assistance due to vision and cognitive deficits. If pt returns home, he will need 24/7 care. OT will continue to follow POC. Discharge recommendation remains appropriate.        If plan is discharge home, recommend the following:  Two people to help with walking and/or transfers;Two people to help with bathing/dressing/bathroom;Assistance with cooking/housework;Assist for transportation;Help with stairs or ramp for entrance;Direct supervision/assist for financial management;Direct supervision/assist for medications management;Supervision due to cognitive status   Equipment Recommendations  Other (comment)       Precautions / Restrictions Precautions Precautions: Fall Recall of  Precautions/Restrictions: Impaired Restrictions Weight Bearing Restrictions Per Provider Order: No Other Position/Activity Restrictions: +       Mobility Bed Mobility Overal bed mobility: Needs Assistance Bed Mobility: Sit to Supine     Supine to sit: Min assist, HOB elevated, Used rails Sit to supine: Max assist, HOB elevated, Used rails   General bed mobility comments: extra time, handheld assist provided and max encouragement for pt to maximize participation    Transfers Overall transfer level: Needs assistance Equipment used: Rolling walker (2 wheels) Transfers: Sit to/from Stand, Bed to chair/wheelchair/BSC Sit to Stand: Mod assist     Step pivot transfers: Max assist     General transfer comment: requires cues for hand placement on recliner, once standing, pt self-limits transfer requiring max A to pivot hips safely back on to bed as pt stops initiation of movement. Small shuffled steps with no foot clearance     Balance Overall balance assessment: Needs assistance Sitting-balance support: Feet supported, Bilateral upper extremity supported Sitting balance-Leahy Scale: Fair     Standing balance support: Bilateral upper extremity supported, During functional activity, Reliant on assistive device for balance Standing balance-Leahy Scale: Poor                             ADL either performed or assessed with clinical judgement   ADL Overall ADL's : Needs assistance/impaired Eating/Feeding: Bed level;Sitting;Maximal assistance Eating/Feeding Details (indicate cue type and reason): upright in bed, pt insisting "feed me, feed me". OT assists by cutting up food - pt unable to visually locate items on tray or describe 2/2  visual and cognitive deficits. Once food is cut, OT loads small bite on fork and places utensil in pt's hand. pt brings fork to mouth. pt then able to place small bites on fork and feed himself after OT identifies items on plate. Pt contines to  request to be fed. Grooming: Sitting;Wash/dry hands;Wash/dry face               Lower Body Dressing: Maximal assistance;Total assistance;Cueing for sequencing;Cueing for safety Lower Body Dressing Details (indicate cue type and reason): attempted to assess LB dressing, pt self-limits assessment and insists on help despite attempts to encourge Toilet Transfer: +2 for physical assistance;+2 for safety/equipment;BSC/3in1;Rolling walker (2 wheels);Minimal assistance;Moderate assistance Toilet Transfer Details (indicate cue type and reason): simulated to recliner, slow shuffled steps, limited foot clearance, requires step by step cuing with RW mgmt         Functional mobility during ADLs: +2 for physical assistance;+2 for safety/equipment;Cueing for safety;Cueing for sequencing;Rolling walker (2 wheels);Moderate assistance General ADL Comments: Pt self-limits ADL and mobility performance. MAX A for LB dressing, up to MIN A to UB ADLs    Extremity/Trunk Assessment Upper Extremity Assessment Upper Extremity Assessment: Generalized weakness   Lower Extremity Assessment Lower Extremity Assessment: Generalized weakness   Cervical / Trunk Assessment Cervical / Trunk Assessment: Normal    Vision Ability to See in Adequate Light: 2 Moderately impaired Additional Comments: pt unable to read name badge, cannot locate items on tray or see call bell. requires hand over hand to place R hand on utensil. tactile button placed on call bell for ease of use with pt able to locate.         Communication Communication Communication: Impaired Factors Affecting Communication: Hearing impaired   Cognition Arousal: Alert Behavior During Therapy: Flat affect Cognition: Cognition impaired   Orientation impairments: Place, Time, Situation Awareness: Intellectual awareness impaired Memory impairment (select all impairments): Short-term memory, Working memory, Non-declarative long-term memory Attention  impairment (select first level of impairment): Focused attention Executive functioning impairment (select all impairments): Initiation, Organization, Sequencing, Reasoning, Problem solving                   Following commands: Impaired Following commands impaired: Follows one step commands with increased time      Cueing   Cueing Techniques: Verbal cues, Tactile cues  Exercises Exercises: Other exercises Other Exercises Other Exercises: NT in room, educated on having +2 for transfer attempts, educated on setting pt up for mealtimes with chopped food and visually describing where items are, but then giving pt opportunity to feed himself despite pt insisting staff feed him            Pertinent Vitals/ Pain       Pain Assessment Pain Assessment: Faces Faces Pain Scale: Hurts a little bit Pain Location: pt vocalizes "ow" with all movements but does not quantify or localize Pain Descriptors / Indicators: Guarding, Grimacing Pain Intervention(s): Limited activity within patient's tolerance  Home Living Family/patient expects to be discharged to:: Private residence Living Arrangements: Alone Available Help at Discharge: Friend(s);Available PRN/intermittently (friend [name] stops by) Type of Home: Apartment Home Access: Level entry     Home Layout: One level     Bathroom Shower/Tub: Tub/shower unit         Home Equipment: Agricultural consultant (2 wheels)              Frequency  Min 2X/week        Progress Toward Goals  OT Goals(current goals can  now be found in the care plan section)  Progress towards OT goals: Progressing toward goals  Acute Rehab OT Goals OT Goal Formulation: Patient unable to participate in goal setting Time For Goal Achievement: 05/10/23 Potential to Achieve Goals: Fair ADL Goals Pt Will Perform Eating: with set-up;sitting Pt Will Perform Grooming: with set-up;sitting Pt Will Perform Lower Body Dressing: with min assist;sit to/from  stand Pt Will Transfer to Toilet: with min assist;ambulating;stand pivot transfer;regular height toilet;bedside commode Pt Will Perform Toileting - Clothing Manipulation and hygiene: with min assist;sit to/from stand  Plan      Co-evaluation    PT/OT/SLP Co-Evaluation/Treatment: Yes Reason for Co-Treatment: Necessary to address cognition/behavior during functional activity;For patient/therapist safety;To address functional/ADL transfers PT goals addressed during session: Mobility/safety with mobility;Proper use of DME OT goals addressed during session: ADL's and self-care      AM-PAC OT "6 Clicks" Daily Activity     Outcome Measure   Help from another person eating meals?: A Little Help from another person taking care of personal grooming?: A Little Help from another person toileting, which includes using toliet, bedpan, or urinal?: A Lot Help from another person bathing (including washing, rinsing, drying)?: A Lot Help from another person to put on and taking off regular upper body clothing?: A Lot Help from another person to put on and taking off regular lower body clothing?: Total 6 Click Score: 13    End of Session Equipment Utilized During Treatment: Rolling walker (2 wheels);Gait belt  OT Visit Diagnosis: Unsteadiness on feet (R26.81);Muscle weakness (generalized) (M62.81);History of falling (Z91.81)   Activity Tolerance Patient tolerated treatment well   Patient Left with chair alarm set;in bed;with bed alarm set   Nurse Communication Mobility status        Time: 0865-7846 OT Time Calculation (min): 12 min  Charges: OT General Charges $OT Visit: 1 Visit  OT Treatments $Self Care/Home Management : 8-22 mins  Bayley Hurn L. Daymion Nazaire, OTR/L  04/26/23, 4:10 PM

## 2023-04-26 NOTE — Care Management Important Message (Deleted)
 Important Message  Patient Details  Name: Derek Lynch MRN: [id number] Date of Birth: 30-Jun-1940   Important Message Given:  Yes - Medicare IM     [name] 04/26/2023, 10:57 AM

## 2023-04-26 NOTE — Progress Notes (Signed)
 PROGRESS NOTE    Derek Lynch   ZOX:[id number] DOB: 1940-04-19  DOA: 04/22/2023 Date of Service: 04/26/23 which is hospital day 3  PCP: Cheryll Dessert, [facility] course / significant events:   HPI: Derek Lynch is a 83 y.o. Caucasian male with medical history significant for osteoarthritis, chronic venous insufficiency, HFpEF, DM2, HTN, HLD. He presented to the emergency room with acute onset of generalized weakness. He was discharged from rehab about 3 days prior, but continued to get weaker w/ hard time getting out of his chair today.  He admits to urinary frequency and dysuria, mild dyspnea   Of note, recent admission 03/07-03/17 for influenza A w/ hypoxia initially requiring BiPAP, rhabdomyolysis, AKI. D/c to SNF.   04/03: to ED, hypotension, tachycardia, tachypnea, elevated WBC, elevated lactic, mild hyperkalemia 5.4, AKI w/ Cr 3.73. CXR question R pleural effusion. Started on IV cefepime, vancomycin, IV fluids. UA concern for UTI.  04/04: improved WBC, Cr, K, VS. BCx(+) strep, pending ID/susceptibilities, deescalate abx to rocephin only for now. Awaiting culture results.  04/05: UCx never sent. BCx pending ID. AKI improving.  04/06: AKI continues to improve. UCx (+)enterococcus faecalis. BCx (+) strep vestibularis d/w ID, plan echo and repeat BCx tomorrow, cont IV abx   04/07: ID to see today. Echo pending. Repeat BCx obtained.      Consultants:  none  Procedures/Surgeries: none      ASSESSMENT & PLAN:   Sepsis due to undetermined organism  Severe sepsis (AKI)  Urinary Tract Infection UCX (+)enterococcus faecalis BCx(+) strep vestibularis abx to ceftriaxone only for now  ID to see  Echo pending read  Repeat BCx pending  Monitor sepsis parameters --> improved VS and WBC    AKI (acute kidney injury) - improving  Baseline GFR >60 no CKD likely prerenal due to volume depletion and dehydration. Associated with mild metabolic acidosis which has  resolved. Avoid nephrotoxins Treat UTI  Monitor BMP Expect UCx may be negative since specimen obtained following initiation of abx    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  SSI continue Actos.   Essential hypertension Holding home lisinopril, lasix given AKI and soft BP   Dyslipidemia continue statin      overweight based on BMI: Body mass index is 29.18 kg/m.  Underweight - under 18  overweight - 25 to 29 obese - 30 or more Class 1 obesity: BMI of 30.0 to 34 Class 2 obesity: BMI of 35.0 to 39 Class 3 obesity: BMI of 40.0 to 49 Super Morbid Obesity: BMI 50-59 Super-super Morbid Obesity: BMI 60+ Significantly low or high BMI is associated with higher medical risk.  Weight management advised as adjunct to other disease management and risk reduction treatments   DVT prophylaxis: heparin IV fluids: can d/c continuous IV fluids once taking po Nutrition: carb modified diet Central lines / other devices: none  Code Status: DNR ACP documentation reviewed:  none on file in [facility]  [facility] needs: SNF rehab Medical barriers to dispo: Echo, repeat BCx. Expected medical readiness for discharge 2+ days pend ID consult       Subjective / Brief ROS:  Patient reports no concerns at this time Denies CP/SOB.  Pain controlled.  Denies new weakness.  Tolerating diet.   Family Communication: none at this time     Objective Findings:  Vitals:   04/25/23 1738 04/25/23 2204 04/26/23 0444 04/26/23 0801  BP: 122/74 127/76 129/77 123/71  Pulse: 81 80 85 85  Resp: 18  18 18 16   Temp: 98.2 F (36.8 C) 98 F (36.7 C) 98.8 F (37.1 C) 98.4 F (36.9 C)  TempSrc: Oral  Oral Oral  SpO2: 99% 100% 96% 95%  Weight:      Height:        Intake/Output Summary (Last 24 hours) at 04/26/2023 1639 Last data filed at 04/26/2023 1529 Gross per 24 hour  Intake 440 ml  Output 900 ml  Net -460 ml   Filed Weights   04/22/23 1700  Weight: 77.1 kg    Examination:  Physical  Exam Constitutional:      General: He is not in acute distress. Cardiovascular:     Rate and Rhythm: Normal rate and regular rhythm.  Pulmonary:     Effort: Pulmonary effort is normal.     Breath sounds: Normal breath sounds.  Abdominal:     General: Abdomen is flat.  Skin:    General: Skin is warm and dry.  Neurological:     Mental Status: He is alert.  Psychiatric:        Mood and Affect: Mood normal.        Behavior: Behavior normal.          Scheduled Medications:   vitamin C  1,000 mg Oral Daily   aspirin EC  81 mg Oral Daily   cholecalciferol  1,000 Units Oral Daily   heparin injection (subcutaneous)  5,000 Units Subcutaneous Q8H   insulin aspart  0-15 Units Subcutaneous TID WC   insulin aspart  0-5 Units Subcutaneous QHS   pioglitazone  15 mg Oral Daily   pravastatin  20 mg Oral q1800    Continuous Infusions:  cefTRIAXone (ROCEPHIN)  IV 200 mL/hr at 04/26/23 1529    PRN Medications:  acetaminophen **OR** acetaminophen, magnesium hydroxide, ondansetron **OR** ondansetron (ZOFRAN) IV, traZODone  Antimicrobials from admission:  Anti-infectives (From admission, onward)    Start     Dose/Rate Route Frequency Ordered Stop   04/23/23 2000  ceFEPIme (MAXIPIME) 2 g in sodium chloride 0.9 % 100 mL IVPB  Status:  Discontinued        2 g 200 mL/hr over 30 Minutes Intravenous  Once 04/22/23 2033 04/22/23 2045   04/23/23 2000  ceFEPIme (MAXIPIME) 2 g in sodium chloride 0.9 % 100 mL IVPB  Status:  Discontinued        2 g 200 mL/hr over 30 Minutes Intravenous Every 24 hours 04/22/23 2045 04/23/23 0912   04/23/23 1800  cefTRIAXone (ROCEPHIN) 2 g in sodium chloride 0.9 % 100 mL IVPB        2 g 200 mL/hr over 30 Minutes Intravenous Every 24 hours 04/23/23 0912     04/22/23 2100  metroNIDAZOLE (FLAGYL) IVPB 500 mg  Status:  Discontinued        500 mg 100 mL/hr over 60 Minutes Intravenous Every 12 hours 04/22/23 2033 04/23/23 0912   04/22/23 2100  vancomycin (VANCOREADY)  IVPB 750 mg/150 mL  Status:  Discontinued        750 mg 150 mL/hr over 60 Minutes Intravenous  Once 04/22/23 2045 04/23/23 0912   04/22/23 2046  vancomycin variable dose per unstable renal function (pharmacist dosing)  Status:  Discontinued         Does not apply See admin instructions 04/22/23 2046 04/23/23 0912   04/22/23 2045  vancomycin (VANCOCIN) IVPB 1000 mg/200 mL premix  Status:  Discontinued        1,000 mg 200 mL/hr over 60 Minutes Intravenous  Once 04/22/23 2033 04/22/23 2037   04/22/23 2043  vancomycin variable dose per unstable renal function (pharmacist dosing)  Status:  Discontinued         Does not apply See admin instructions 04/22/23 2045 04/22/23 2046   04/22/23 1745  ceFEPIme (MAXIPIME) 2 g in sodium chloride 0.9 % 100 mL IVPB        2 g 200 mL/hr over 30 Minutes Intravenous  Once 04/22/23 1742 04/22/23 1854   04/22/23 1745  vancomycin (VANCOCIN) IVPB 1000 mg/200 mL premix        1,000 mg 200 mL/hr over 60 Minutes Intravenous  Once 04/22/23 1742 04/22/23 1954           Data Reviewed:  I have personally reviewed the following...  CBC: Recent Labs  Lab 04/22/23 1732 04/23/23 0450  WBC 15.2* 8.2  NEUTROABS 12.8*  --   HGB 13.3 10.4*  HCT 40.4 30.7*  MCV 95.3 94.2  PLT 278 175   Basic Metabolic Panel: Recent Labs  Lab 04/22/23 1732 04/23/23 0450 04/24/23 0504 04/25/23 0516 04/26/23 0557  NA 138 139 140 139 138  K 5.4* 4.3 4.2 4.3 3.8  CL 105 109 110 108 106  CO2 18* 22 22 24  20*  GLUCOSE 210* 125* 99 105* 90  BUN 53* 41* 29* 21 13  CREATININE 3.73* 2.93* 2.03* 1.61* 1.52*  CALCIUM 9.2 7.9* 8.0* 7.9* 7.9*   GFR: Estimated Creatinine Clearance: 36.4 mL/min (A) (by C-G formula based on SCr of 1.52 mg/dL (H)). Liver Function Tests: Recent Labs  Lab 04/22/23 1732  AST 22  ALT 16  ALKPHOS 57  BILITOT 0.8  PROT 7.7  ALBUMIN 3.4*   No results for input(s): "LIPASE", "AMYLASE" in the last 168 hours. No results for input(s): "AMMONIA" in the  last 168 hours. Coagulation Profile: Recent Labs  Lab 04/22/23 1732 04/23/23 0450  INR 1.2 1.3*   Cardiac Enzymes: No results for input(s): "CKTOTAL", "CKMB", "CKMBINDEX", "TROPONINI" in the last 168 hours. BNP (last 3 results) No results for input(s): "PROBNP" in the last 8760 hours. HbA1C: No results for input(s): "HGBA1C" in the last 72 hours. CBG: Recent Labs  Lab 04/25/23 1204 04/25/23 1737 04/25/23 2143 04/26/23 0808 04/26/23 1256  GLUCAP 126* 93 112* 97 130*   Lipid Profile: No results for input(s): "CHOL", "HDL", "LDLCALC", "TRIG", "CHOLHDL", "LDLDIRECT" in the last 72 hours. Thyroid Function Tests: No results for input(s): "TSH", "T4TOTAL", "FREET4", "T3FREE", "THYROIDAB" in the last 72 hours. Anemia Panel: No results for input(s): "VITAMINB12", "FOLATE", "FERRITIN", "TIBC", "IRON", "RETICCTPCT" in the last 72 hours. Most Recent Urinalysis On File:     Component Value Date/Time   COLORURINE YELLOW (A) 04/22/2023 2232   APPEARANCEUR CLOUDY (A) 04/22/2023 2232   LABSPEC 1.018 04/22/2023 2232   PHURINE 5.0 04/22/2023 2232   GLUCOSEU NEGATIVE 04/22/2023 2232   HGBUR MODERATE (A) 04/22/2023 2232   BILIRUBINUR NEGATIVE 04/22/2023 2232   KETONESUR NEGATIVE 04/22/2023 2232   PROTEINUR 30 (A) 04/22/2023 2232   NITRITE NEGATIVE 04/22/2023 2232   LEUKOCYTESUR LARGE (A) 04/22/2023 2232   Sepsis Labs: @LABRCNTIP (procalcitonin:4,lacticidven:4) Microbiology: Recent Results (from the past 240 hours)  Culture, blood (Routine x 2)     Status: Abnormal   Collection Time: 04/22/23  5:20 PM   Specimen: BLOOD  Result Value Ref Range Status   Specimen Description   Final    BLOOD RAC Performed at [facility], [address], [place], [place] [address]    Special Requests  Final    BOTTLES DRAWN AEROBIC AND ANAEROBIC Blood Culture results may not be optimal due to an inadequate volume of blood received in culture bottles Performed at [facility], [address] [place], [place] [address]    Culture  Setup Time   Final    ANAEROBIC BOTTLE ONLY GRAM POSITIVE COCCI CRITICAL RESULT CALLED TO, READ BACK BY AND VERIFIED WITH: TREY GREENWOOD @0736  04/23/23 MJU Performed at [facility], [address] [place], [place] [address]    Culture STREPTOCOCCUS VESTIBULARIS (A)  Final   Report Status 04/25/2023 FINAL  Final   Organism ID, Bacteria STREPTOCOCCUS VESTIBULARIS  Final      Susceptibility   Streptococcus vestibularis - MIC*    PENICILLIN <=0.06 SENSITIVE Sensitive     CEFTRIAXONE <=0.12 SENSITIVE Sensitive     ERYTHROMYCIN <=0.12 SENSITIVE Sensitive     LEVOFLOXACIN 2 SENSITIVE Sensitive     VANCOMYCIN 1 SENSITIVE Sensitive     * STREPTOCOCCUS VESTIBULARIS  Blood Culture ID Panel (Reflexed)     Status: Abnormal   Collection Time: 04/22/23  5:20 PM  Result Value Ref Range Status   Enterococcus faecalis NOT DETECTED NOT DETECTED Final   Enterococcus Faecium NOT DETECTED NOT DETECTED Final   Listeria monocytogenes NOT DETECTED NOT DETECTED Final   Staphylococcus species NOT DETECTED NOT DETECTED Final   Staphylococcus aureus (BCID) NOT DETECTED NOT DETECTED Final   Staphylococcus epidermidis NOT DETECTED NOT DETECTED Final   Staphylococcus lugdunensis NOT DETECTED NOT DETECTED Final   Streptococcus species DETECTED (A) NOT DETECTED Final    Comment: Not Enterococcus species, Streptococcus agalactiae, Streptococcus pyogenes, or Streptococcus pneumoniae. CRITICAL RESULT CALLED TO, READ BACK BY AND VERIFIED WITH: TREY GREENWOOD @0736  04/23/23 MJU    Streptococcus agalactiae NOT DETECTED NOT DETECTED Final   Streptococcus pneumoniae NOT DETECTED NOT DETECTED Final   Streptococcus pyogenes NOT DETECTED NOT DETECTED Final   A.calcoaceticus-baumannii NOT DETECTED NOT DETECTED Final   Bacteroides fragilis NOT DETECTED NOT DETECTED Final   Enterobacterales NOT DETECTED NOT DETECTED Final   Enterobacter cloacae complex NOT DETECTED NOT  DETECTED Final   Escherichia coli NOT DETECTED NOT DETECTED Final   Klebsiella aerogenes NOT DETECTED NOT DETECTED Final   Klebsiella oxytoca NOT DETECTED NOT DETECTED Final   Klebsiella pneumoniae NOT DETECTED NOT DETECTED Final   Proteus species NOT DETECTED NOT DETECTED Final   Salmonella species NOT DETECTED NOT DETECTED Final   Serratia marcescens NOT DETECTED NOT DETECTED Final   Haemophilus influenzae NOT DETECTED NOT DETECTED Final   Neisseria meningitidis NOT DETECTED NOT DETECTED Final   Pseudomonas aeruginosa NOT DETECTED NOT DETECTED Final   Stenotrophomonas maltophilia NOT DETECTED NOT DETECTED Final   Candida albicans NOT DETECTED NOT DETECTED Final   Candida auris NOT DETECTED NOT DETECTED Final   Candida glabrata NOT DETECTED NOT DETECTED Final   Candida krusei NOT DETECTED NOT DETECTED Final   Candida parapsilosis NOT DETECTED NOT DETECTED Final   Candida tropicalis NOT DETECTED NOT DETECTED Final   Cryptococcus neoformans/gattii NOT DETECTED NOT DETECTED Final    Comment: Performed at [facility], [address] [place], [place] [address]  Culture, blood (Routine x 2)     Status: None (Preliminary result)   Collection Time: 04/22/23  6:37 PM   Specimen: BLOOD LEFT HAND  Result Value Ref Range Status   Specimen Description BLOOD LEFT HAND AEROBIC BOTTLE ONLY  Final   Special Requests   Final    AEROBIC BOTTLE ONLY  Blood Culture results may not be optimal due to an inadequate volume of blood received in culture bottles   Culture   Final    NO GROWTH 4 DAYS Performed at [facility], [address] [place], [place] [address]    Report Status PENDING  Incomplete  Urine Culture (for pregnant, neutropenic or urologic patients or patients with an indwelling urinary catheter)     Status: Abnormal   Collection Time: 04/24/23 11:25 AM   Specimen: Urine, Clean Catch  Result Value Ref Range Status   Specimen Description   Final    URINE, CLEAN  CATCH Performed at [facility], [address] [place], [place] [address]    Special Requests   Final    NONE Performed at [facility], [address], [place], [place] [address]    Culture 50,000 COLONIES/mL YEAST (A)  Final   Report Status 04/26/2023 FINAL  Final      Radiology Studies last 3 days: DG Chest Port 1 View Result Date: 04/22/2023 CLINICAL DATA:  Sepsis.  Altered mental status. EXAM: PORTABLE CHEST 1 VIEW COMPARISON:  March 27, 2023. FINDINGS: Stable cardiomegaly. Left lung is clear. Minimal right basilar opacity is noted concerning for subsegmental atelectasis and possible small effusion. Bony thorax is unremarkable. IMPRESSION: Minimal right basilar opacity is noted concerning for subsegmental atelectasis and possible small right pleural effusion. Electronically Signed   By: Lupita Raider M.D.   On: 04/22/2023 17:41        Sunnie Nielsen, DO [facility] 04/26/2023, 4:39 PM    Dictation software may have been used to generate the above note. Typos may occur and escape review in typed/dictated notes. Please contact Dr Lyn Hollingshead directly for clarity if needed.  Staff may message me via secure chat in Epic  but this may not receive an immediate response,  please Sauls me for urgent matters!  If 7PM-7AM, please contact night coverage [email or web address]

## 2023-04-26 NOTE — Care Management Important Message (Deleted)
 Important Message  Patient Details  Name: Derek Lynch MRN: [id number] Date of Birth: 08/04/1940   Important Message Given:        [name] 04/26/2023, 10:41 AM

## 2023-04-27 ENCOUNTER — Other Ambulatory Visit: Payer: Self-pay | Admitting: Infectious Diseases

## 2023-04-27 ENCOUNTER — Inpatient Hospital Stay

## 2023-04-27 DIAGNOSIS — A419 Sepsis, unspecified organism: Secondary | ICD-10-CM | POA: Diagnosis not present

## 2023-04-27 LAB — CULTURE, BLOOD (ROUTINE X 2): Culture: NO GROWTH

## 2023-04-27 LAB — ECHOCARDIOGRAM COMPLETE
AR max vel: 2.39 cm2
AV Area VTI: 2.52 cm2
AV Area mean vel: 2.6 cm2
AV Mean grad: 3 mmHg
AV Peak grad: 5 mmHg
Ao pk vel: 1.12 m/s
Area-P 1/2: 4.21 cm2
Height: 64 in
MV VTI: 2.43 cm2
S' Lateral: 2.9 cm
Weight: 2719.59 [oz_av]

## 2023-04-27 LAB — BASIC METABOLIC PANEL WITH GFR
Anion gap: 10 (ref 5–15)
BUN: 9 mg/dL (ref 8–23)
CO2: 21 mmol/L — ABNORMAL LOW (ref 22–32)
Calcium: 8 mg/dL — ABNORMAL LOW (ref 8.9–10.3)
Chloride: 103 mmol/L (ref 98–111)
Creatinine, Ser: 1.36 mg/dL — ABNORMAL HIGH (ref 0.61–1.24)
GFR, Estimated: 53 mL/min — ABNORMAL LOW (ref >60)
Glucose, Bld: 90 mg/dL (ref 70–99)
Potassium: 3.6 mmol/L (ref 3.5–5.1)
Sodium: 134 mmol/L — ABNORMAL LOW (ref 135–145)

## 2023-04-27 LAB — GLUCOSE, CAPILLARY
Glucose-Capillary: 85 mg/dL (ref 70–99)
Glucose-Capillary: 125 mg/dL — ABNORMAL HIGH (ref 70–99)
Glucose-Capillary: 79 mg/dL (ref 70–99)
Glucose-Capillary: 147 mg/dL — ABNORMAL HIGH (ref 70–99)

## 2023-04-27 MED ORDER — TUBERCULIN PPD 5 UNIT/0.1ML ID SOLN
5.0000 [IU] | Freq: Once | INTRADERMAL | Status: AC
Start: 2023-04-27 — End: 2023-04-29
  Administered 2023-04-27: 5 [IU] via INTRADERMAL
  Filled 2023-04-27: qty 0.1

## 2023-04-27 MED ORDER — FLUCONAZOLE 50 MG PO TABS
150.0000 mg | ORAL_TABLET | Freq: Once | ORAL | Status: AC
  Administered 2023-04-27: 150 mg via ORAL
  Filled 2023-04-27: qty 1

## 2023-04-27 NOTE — Plan of Care (Signed)

## 2023-04-27 NOTE — TOC Initial Note (Signed)
 Transition of Care [facility]) - Initial/Assessment Note    Patient Details  Name: Derek Lynch MRN: [id number] Date of Birth: 1940-06-10  Transition of [facility] CM/SW Contact:    Chapman Fitch, RN Phone Number: 04/27/2023, 4:34 PM  Clinical Narrative:                  Spoke with [name] long term friend who assist with decisions She has spoken with Amalia Hailey at the [facility] to discuss ALF placement.  Therapy recommending SNF [name] in agreement to SNF bed search, and clinical will also be sent to [facility] at the [facility] to review to determine if they can meet his needs   Existing PASRR Fl2 sent for signature Bed search initiated        Patient Goals and CMS Choice            Expected Discharge Plan and Services                                              Prior Living Arrangements/Services                       Activities of Daily Living      Permission Sought/Granted                  Emotional Assessment              Admission diagnosis:  AKI (acute kidney injury) (HCC) [N17.9] Elevated lactic acid level [R79.89] Sepsis due to undetermined organism (HCC) [A41.9] Leukocytosis, unspecified type [D72.829] Patient Active Problem List   Diagnosis Date Noted   Sepsis due to undetermined organism (HCC) 04/22/2023   AKI (acute kidney injury) (HCC) 04/22/2023   Essential hypertension 04/22/2023   Influenza A with pneumonia 03/26/2023   Acute metabolic encephalopathy 03/26/2023   Hypoxia 03/26/2023   CHF (congestive heart failure) (HCC) 03/26/2023   Sepsis due to cellulitis (HCC) 11/21/2021   Dyslipidemia 11/21/2021   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 (HCC) 11/21/2021   Fall 11/21/2021   Rhabdomyolysis 11/20/2021   Chronic venous insufficiency 04/14/2017   Lymphedema 04/14/2017   Pain in both lower extremities 03/17/2017   Hyperlipidemia 03/17/2017   PCP:  Cheryll Dessert, FNP Pharmacy:   [facility] [address]  (N), [place] - [address] [place]) [place] [address] Phone: [phone] Fax: [phone]     Social Drivers of Health (SDOH) Social History: SDOH Screenings   Food Insecurity: Patient Unable To Answer (04/24/2023)  Housing: Patient Unable To Answer (04/24/2023)  Transportation Needs: Patient Unable To Answer (04/24/2023)  Utilities: Not At Risk (04/24/2023)  Financial Resource Strain: Low Risk  (10/29/2022)   Received from [facility]  Social Connections: Unknown (04/24/2023)  Tobacco Use: Medium Risk (03/26/2023)   SDOH Interventions:     Readmission Risk Interventions     No data to display

## 2023-04-27 NOTE — NC FL2 (Signed)
 [place] MEDICAID FL2 LEVEL OF CARE FORM     IDENTIFICATION  Patient Name: Derek Lynch Birthdate: 05/06/1940 Sex: male Admission Date (Current Location): 04/22/2023  [facility] and [place] Number:  Chiropodist and Address:         Provider Number: [id number]  Attending Physician Name and Address:  Sunnie Nielsen, DO  Relative Name and Phone Number:       Current Level of Care: Hospital Recommended Level of Care: Skilled Nursing Facility Prior Approval Number:    Date Approved/Denied:   PASRR Number: [id number]  Discharge Plan: SNF    Current Diagnoses: Patient Active Problem List   Diagnosis Date Noted   Sepsis due to undetermined organism (HCC) 04/22/2023   AKI (acute kidney injury) (HCC) 04/22/2023   Essential hypertension 04/22/2023   Influenza A with pneumonia 03/26/2023   Acute metabolic encephalopathy 03/26/2023   Hypoxia 03/26/2023   CHF (congestive heart failure) (HCC) 03/26/2023   Sepsis due to cellulitis (HCC) 11/21/2021   Dyslipidemia 11/21/2021   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 (HCC) 11/21/2021   Fall 11/21/2021   Rhabdomyolysis 11/20/2021   Chronic venous insufficiency 04/14/2017   Lymphedema 04/14/2017   Pain in both lower extremities 03/17/2017   Hyperlipidemia 03/17/2017    Orientation RESPIRATION BLADDER Height & Weight     Self, Time  Normal Incontinent Weight: 77.1 kg Height:  5\' 4"  (162.6 cm)  BEHAVIORAL SYMPTOMS/MOOD NEUROLOGICAL BOWEL NUTRITION STATUS      Continent Diet (dys 3)  AMBULATORY STATUS COMMUNICATION OF NEEDS Skin   Total Care Verbally Skin abrasions, PU Stage and Appropriate Care, Other (Comment) (blisters)                       Personal Care Assistance Level of Assistance              Functional Limitations Info             SPECIAL CARE FACTORS FREQUENCY  PT (By licensed PT), OT (By licensed OT)                    Contractures Contractures Info: Not present     Additional Factors Info  Code Status, Allergies Code Status Info: DNR Allergies Info: Penicillin G           Current Medications (04/27/2023):  This is the current hospital active medication list Current Facility-Administered Medications  Medication Dose Route Frequency Provider Last Rate Last Admin   acetaminophen (TYLENOL) tablet 650 mg  650 mg Oral Q6H PRN Mansy, Jan A, MD       Or   acetaminophen (TYLENOL) suppository 650 mg  650 mg Rectal Q6H PRN Mansy, Jan A, MD       ascorbic acid (VITAMIN C) tablet 1,000 mg  1,000 mg Oral Daily Mansy, Jan A, MD   1,000 mg at 04/27/23 0846   aspirin EC tablet 81 mg  81 mg Oral Daily Mansy, Jan A, MD   81 mg at 04/27/23 0846   cefTRIAXone (ROCEPHIN) 2 g in sodium chloride 0.9 % 100 mL IVPB  2 g Intravenous Q24H Sunnie Nielsen, DO 200 mL/hr at 04/26/23 1709 2 g at 04/26/23 1709   cholecalciferol (VITAMIN D3) 25 MCG (1000 UNIT) tablet 1,000 Units  1,000 Units Oral Daily Mansy, Jan A, MD   1,000 Units at 04/27/23 0846   heparin injection 5,000 Units  5,000 Units Subcutaneous Q8H Sunnie Nielsen, DO   5,000 Units at 04/27/23  1425   insulin aspart (novoLOG) injection 0-15 Units  0-15 Units Subcutaneous TID WC Mansy, Jan A, MD   2 Units at 04/27/23 1207   insulin aspart (novoLOG) injection 0-5 Units  0-5 Units Subcutaneous QHS Mansy, Jan A, MD       magnesium hydroxide (MILK OF MAGNESIA) suspension 30 mL  30 mL Oral Daily PRN Mansy, Jan A, MD       ondansetron [facility]) tablet 4 mg  4 mg Oral Q6H PRN Mansy, Jan A, MD       Or   ondansetron [facility]) injection 4 mg  4 mg Intravenous Q6H PRN Mansy, Jan A, MD       pioglitazone (ACTOS) tablet 15 mg  15 mg Oral Daily Mansy, Jan A, MD   15 mg at 04/27/23 0846   pravastatin (PRAVACHOL) tablet 20 mg  20 mg Oral q1800 Mansy, Jan A, MD   20 mg at 04/26/23 1707   traZODone (DESYREL) tablet 25 mg  25 mg Oral QHS PRN Mansy, Vernetta Honey, MD         Discharge Medications: Please see discharge summary for a list  of discharge medications.  Relevant Imaging Results:  Relevant Lab Results:   Additional Information SS# [id number]  Chapman Fitch, RN

## 2023-04-27 NOTE — Plan of Care (Signed)
  Problem: Pain Managment: Goal: General experience of comfort will improve and/or be controlled Outcome: Progressing   Problem: Safety: Goal: Ability to remain free from injury will improve Outcome: Progressing

## 2023-04-27 NOTE — Progress Notes (Signed)
 PROGRESS NOTE    Derek Lynch   ZOX:[id number] DOB: 08/13/1940  DOA: 04/22/2023 Date of Service: 04/27/23 which is hospital day 4  PCP: Cheryll Dessert, [facility] course / significant events:   HPI: Derek Lynch is a 83 y.o. Caucasian male with medical history significant for osteoarthritis, chronic venous insufficiency, HFpEF, DM2, HTN, HLD. He presented to the emergency room with acute onset of generalized weakness. He was discharged from rehab about 3 days prior, but continued to get weaker w/ hard time getting out of his chair today.  He admits to urinary frequency and dysuria, mild dyspnea   Of note, recent admission 03/07-03/17 for influenza A w/ hypoxia initially requiring BiPAP, rhabdomyolysis, AKI. D/c to SNF.   04/03: to ED, hypotension, tachycardia, tachypnea, elevated WBC, elevated lactic, mild hyperkalemia 5.4, AKI w/ Cr 3.73. CXR question R pleural effusion. Started on IV cefepime, vancomycin, IV fluids. UA concern for UTI.  04/04: improved WBC, Cr, K, VS. BCx(+) strep, pending ID/susceptibilities, deescalate abx to rocephin only for now. Awaiting culture results.  04/05: UCx never sent so collected today. BCx pending ID. AKI improving.  04/06: AKI continues to improve. UCx pending BCx (+) strep vestibularis d/w ID, plan echo and repeat BCx tomorrow, cont IV abx   04/07: ID to see today. Echo pending. Repeat BCx obtained. UCx (+)yeast 04/08: per ID, no source for bacteremia, do not need TEE. Recs for abd [place] eval liver/ascites.      Consultants:  Infectious disease   Procedures/Surgeries: none      ASSESSMENT & PLAN:   Sepsis due to undetermined organism  Severe sepsis (AKI)  Urinary Tract Infection UCX (+)yeast BCx(+) strep vestibularis Echo no vegetation abx to ceftriaxone Dose diflucan, question urine contamination but given Dm2 will treat for yeast infection  ID saw him yesterday  TTE ok, per ID no TEE needed  Repeat BCx no growth x1 day,  continue following  Monitor sepsis parameters --> improved VS and WBC    AKI (acute kidney injury) - improving  Baseline GFR >60 no CKD likely prerenal due to volume depletion and dehydration. Associated with mild metabolic acidosis which has resolved. Avoid nephrotoxins Monitor BMP  UCx (+)yeast Question contaminant but given DM2 will tx diflucan x1  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  SSI continue Actos.   Essential hypertension Holding home lisinopril, lasix given AKI and soft BP   Dyslipidemia continue statin      overweight based on BMI: Body mass index is 29.18 kg/m.  Underweight - under 18  overweight - 25 to 29 obese - 30 or more Class 1 obesity: BMI of 30.0 to 34 Class 2 obesity: BMI of 35.0 to 39 Class 3 obesity: BMI of 40.0 to 49 Super Morbid Obesity: BMI 50-59 Super-super Morbid Obesity: BMI 60+ Significantly low or high BMI is associated with higher medical risk.  Weight management advised as adjunct to other disease management and risk reduction treatments   DVT prophylaxis: heparin IV fluids: none Nutrition: carb modified diet Central lines / other devices: none  Code Status: DNR ACP documentation reviewed:  none on file in [facility]  [facility] needs: SNF rehab Medical barriers to dispo: Echo, repeat BCx. Expected medical readiness for discharge 2+ days pend ID clearance            Subjective / Brief ROS:  Patient reports no concerns at this time Denies CP/SOB.  Pain controlled.  Denies new weakness.  Tolerating diet.   Family Communication:  called [name] 04/27/23 3:53 PM left HIPAA compliant voicemail I am caring for loved on here, no major updates, remains on abx and ID following no obvious source bacteremia, awaiting ultrasound, hoping stable for dc soon to SNF    Objective Findings:  Vitals:   04/26/23 1731 04/27/23 0413 04/27/23 0736 04/27/23 1526  BP: (!) 142/77 124/83 136/83 135/77  Pulse: 80 86 88 88  Resp: 17 20 18  16   Temp: 98.5 F (36.9 C) 98.4 F (36.9 C) 98.4 F (36.9 C) 98.6 F (37 C)  TempSrc: Oral Oral Oral Oral  SpO2: 99% 100% 97% 97%  Weight:      Height:        Intake/Output Summary (Last 24 hours) at 04/27/2023 1553 Last data filed at 04/27/2023 0900 Gross per 24 hour  Intake 120 ml  Output 1250 ml  Net -1130 ml   Filed Weights   04/22/23 1700  Weight: 77.1 kg    Examination:  Physical Exam Constitutional:      General: He is not in acute distress. Cardiovascular:     Rate and Rhythm: Normal rate and regular rhythm.  Pulmonary:     Effort: Pulmonary effort is normal.     Breath sounds: Normal breath sounds.  Abdominal:     General: Abdomen is flat.  Skin:    General: Skin is warm and dry.  Neurological:     Mental Status: He is alert.  Psychiatric:        Mood and Affect: Mood normal.        Behavior: Behavior normal.          Scheduled Medications:   vitamin C  1,000 mg Oral Daily   aspirin EC  81 mg Oral Daily   cholecalciferol  1,000 Units Oral Daily   heparin injection (subcutaneous)  5,000 Units Subcutaneous Q8H   insulin aspart  0-15 Units Subcutaneous TID WC   insulin aspart  0-5 Units Subcutaneous QHS   pioglitazone  15 mg Oral Daily   pravastatin  20 mg Oral q1800    Continuous Infusions:  cefTRIAXone (ROCEPHIN)  IV 2 g (04/26/23 1709)    PRN Medications:  acetaminophen **OR** acetaminophen, magnesium hydroxide, ondansetron **OR** ondansetron (ZOFRAN) IV, traZODone  Antimicrobials from admission:  Anti-infectives (From admission, onward)    Start     Dose/Rate Route Frequency Ordered Stop   04/27/23 0930  fluconazole (DIFLUCAN) tablet 150 mg        150 mg Oral  Once 04/27/23 0836 04/27/23 1002   04/23/23 2000  ceFEPIme (MAXIPIME) 2 g in sodium chloride 0.9 % 100 mL IVPB  Status:  Discontinued        2 g 200 mL/hr over 30 Minutes Intravenous  Once 04/22/23 2033 04/22/23 2045   04/23/23 2000  ceFEPIme (MAXIPIME) 2 g in sodium chloride  0.9 % 100 mL IVPB  Status:  Discontinued        2 g 200 mL/hr over 30 Minutes Intravenous Every 24 hours 04/22/23 2045 04/23/23 0912   04/23/23 1800  cefTRIAXone (ROCEPHIN) 2 g in sodium chloride 0.9 % 100 mL IVPB        2 g 200 mL/hr over 30 Minutes Intravenous Every 24 hours 04/23/23 0912     04/22/23 2100  metroNIDAZOLE (FLAGYL) IVPB 500 mg  Status:  Discontinued        500 mg 100 mL/hr over 60 Minutes Intravenous Every 12 hours 04/22/23 2033 04/23/23 0912   04/22/23 2100  vancomycin (VANCOREADY)  IVPB 750 mg/150 mL  Status:  Discontinued        750 mg 150 mL/hr over 60 Minutes Intravenous  Once 04/22/23 2045 04/23/23 0912   04/22/23 2046  vancomycin variable dose per unstable renal function (pharmacist dosing)  Status:  Discontinued         Does not apply See admin instructions 04/22/23 2046 04/23/23 0912   04/22/23 2045  vancomycin (VANCOCIN) IVPB 1000 mg/200 mL premix  Status:  Discontinued        1,000 mg 200 mL/hr over 60 Minutes Intravenous  Once 04/22/23 2033 04/22/23 2037   04/22/23 2043  vancomycin variable dose per unstable renal function (pharmacist dosing)  Status:  Discontinued         Does not apply See admin instructions 04/22/23 2045 04/22/23 2046   04/22/23 1745  ceFEPIme (MAXIPIME) 2 g in sodium chloride 0.9 % 100 mL IVPB        2 g 200 mL/hr over 30 Minutes Intravenous  Once 04/22/23 1742 04/22/23 1854   04/22/23 1745  vancomycin (VANCOCIN) IVPB 1000 mg/200 mL premix        1,000 mg 200 mL/hr over 60 Minutes Intravenous  Once 04/22/23 1742 04/22/23 1954           Data Reviewed:  I have personally reviewed the following...  CBC: Recent Labs  Lab 04/22/23 1732 04/23/23 0450  WBC 15.2* 8.2  NEUTROABS 12.8*  --   HGB 13.3 10.4*  HCT 40.4 30.7*  MCV 95.3 94.2  PLT 278 175   Basic Metabolic Panel: Recent Labs  Lab 04/23/23 0450 04/24/23 0504 04/25/23 0516 04/26/23 0557 04/27/23 0925  NA 139 140 139 138 134*  K 4.3 4.2 4.3 3.8 3.6  CL 109 110  108 106 103  CO2 22 22 24  20* 21*  GLUCOSE 125* 99 105* 90 90  BUN 41* 29* 21 13 9   CREATININE 2.93* 2.03* 1.61* 1.52* 1.36*  CALCIUM 7.9* 8.0* 7.9* 7.9* 8.0*   GFR: Estimated Creatinine Clearance: 40.7 mL/min (A) (by C-G formula based on SCr of 1.36 mg/dL (H)). Liver Function Tests: Recent Labs  Lab 04/22/23 1732  AST 22  ALT 16  ALKPHOS 57  BILITOT 0.8  PROT 7.7  ALBUMIN 3.4*   No results for input(s): "LIPASE", "AMYLASE" in the last 168 hours. No results for input(s): "AMMONIA" in the last 168 hours. Coagulation Profile: Recent Labs  Lab 04/22/23 1732 04/23/23 0450  INR 1.2 1.3*   Cardiac Enzymes: No results for input(s): "CKTOTAL", "CKMB", "CKMBINDEX", "TROPONINI" in the last 168 hours. BNP (last 3 results) No results for input(s): "PROBNP" in the last 8760 hours. HbA1C: No results for input(s): "HGBA1C" in the last 72 hours. CBG: Recent Labs  Lab 04/26/23 1256 04/26/23 1649 04/26/23 2123 04/27/23 0744 04/27/23 1147  GLUCAP 130* 101* 104* 85 125*   Lipid Profile: No results for input(s): "CHOL", "HDL", "LDLCALC", "TRIG", "CHOLHDL", "LDLDIRECT" in the last 72 hours. Thyroid Function Tests: No results for input(s): "TSH", "T4TOTAL", "FREET4", "T3FREE", "THYROIDAB" in the last 72 hours. Anemia Panel: No results for input(s): "VITAMINB12", "FOLATE", "FERRITIN", "TIBC", "IRON", "RETICCTPCT" in the last 72 hours. Most Recent Urinalysis On File:     Component Value Date/Time   COLORURINE YELLOW (A) 04/22/2023 2232   APPEARANCEUR CLOUDY (A) 04/22/2023 2232   LABSPEC 1.018 04/22/2023 2232   PHURINE 5.0 04/22/2023 2232   GLUCOSEU NEGATIVE 04/22/2023 2232   HGBUR MODERATE (A) 04/22/2023 2232   BILIRUBINUR NEGATIVE 04/22/2023 2232   Lavenia Atlas  NEGATIVE 04/22/2023 2232   PROTEINUR 30 (A) 04/22/2023 2232   NITRITE NEGATIVE 04/22/2023 2232   LEUKOCYTESUR LARGE (A) 04/22/2023 2232   Sepsis Labs: @LABRCNTIP (procalcitonin:4,lacticidven:4) Microbiology: Recent  Results (from the past 240 hours)  Culture, blood (Routine x 2)     Status: Abnormal   Collection Time: 04/22/23  5:20 PM   Specimen: BLOOD  Result Value Ref Range Status   Specimen Description   Final    BLOOD RAC Performed at [facility], [address] [place], [place] [address]    Special Requests   Final    BOTTLES DRAWN AEROBIC AND ANAEROBIC Blood Culture results may not be optimal due to an inadequate volume of blood received in culture bottles Performed at [facility], [address] [place], [place] [address]    Culture  Setup Time   Final    ANAEROBIC BOTTLE ONLY GRAM POSITIVE COCCI CRITICAL RESULT CALLED TO, READ BACK BY AND VERIFIED WITH: TREY GREENWOOD @0736  04/23/23 MJU Performed at [facility], [address] [place], [place] [address]    Culture STREPTOCOCCUS VESTIBULARIS (A)  Final   Report Status 04/25/2023 FINAL  Final   Organism ID, Bacteria STREPTOCOCCUS VESTIBULARIS  Final      Susceptibility   Streptococcus vestibularis - MIC*    PENICILLIN <=0.06 SENSITIVE Sensitive     CEFTRIAXONE <=0.12 SENSITIVE Sensitive     ERYTHROMYCIN <=0.12 SENSITIVE Sensitive     LEVOFLOXACIN 2 SENSITIVE Sensitive     VANCOMYCIN 1 SENSITIVE Sensitive     * STREPTOCOCCUS VESTIBULARIS  Blood Culture ID Panel (Reflexed)     Status: Abnormal   Collection Time: 04/22/23  5:20 PM  Result Value Ref Range Status   Enterococcus faecalis NOT DETECTED NOT DETECTED Final   Enterococcus Faecium NOT DETECTED NOT DETECTED Final   Listeria monocytogenes NOT DETECTED NOT DETECTED Final   Staphylococcus species NOT DETECTED NOT DETECTED Final   Staphylococcus aureus (BCID) NOT DETECTED NOT DETECTED Final   Staphylococcus epidermidis NOT DETECTED NOT DETECTED Final   Staphylococcus lugdunensis NOT DETECTED NOT DETECTED Final   Streptococcus species DETECTED (A) NOT DETECTED Final    Comment: Not Enterococcus species, Streptococcus agalactiae, Streptococcus pyogenes,  or Streptococcus pneumoniae. CRITICAL RESULT CALLED TO, READ BACK BY AND VERIFIED WITH: TREY GREENWOOD @0736  04/23/23 MJU    Streptococcus agalactiae NOT DETECTED NOT DETECTED Final   Streptococcus pneumoniae NOT DETECTED NOT DETECTED Final   Streptococcus pyogenes NOT DETECTED NOT DETECTED Final   A.calcoaceticus-baumannii NOT DETECTED NOT DETECTED Final   Bacteroides fragilis NOT DETECTED NOT DETECTED Final   Enterobacterales NOT DETECTED NOT DETECTED Final   Enterobacter cloacae complex NOT DETECTED NOT DETECTED Final   Escherichia coli NOT DETECTED NOT DETECTED Final   Klebsiella aerogenes NOT DETECTED NOT DETECTED Final   Klebsiella oxytoca NOT DETECTED NOT DETECTED Final   Klebsiella pneumoniae NOT DETECTED NOT DETECTED Final   Proteus species NOT DETECTED NOT DETECTED Final   Salmonella species NOT DETECTED NOT DETECTED Final   Serratia marcescens NOT DETECTED NOT DETECTED Final   Haemophilus influenzae NOT DETECTED NOT DETECTED Final   Neisseria meningitidis NOT DETECTED NOT DETECTED Final   Pseudomonas aeruginosa NOT DETECTED NOT DETECTED Final   Stenotrophomonas maltophilia NOT DETECTED NOT DETECTED Final   Candida albicans NOT DETECTED NOT DETECTED Final   Candida auris NOT DETECTED NOT DETECTED Final   Candida glabrata NOT DETECTED NOT DETECTED Final   Candida krusei NOT DETECTED NOT DETECTED Final   Candida parapsilosis NOT  DETECTED NOT DETECTED Final   Candida tropicalis NOT DETECTED NOT DETECTED Final   Cryptococcus neoformans/gattii NOT DETECTED NOT DETECTED Final    Comment: Performed at [facility], [address] [place], [place] [address]  Culture, blood (Routine x 2)     Status: None   Collection Time: 04/22/23  6:37 PM   Specimen: BLOOD LEFT HAND  Result Value Ref Range Status   Specimen Description BLOOD LEFT HAND AEROBIC BOTTLE ONLY  Final   Special Requests   Final    AEROBIC BOTTLE ONLY Blood Culture results may not be optimal due to an  inadequate volume of blood received in culture bottles   Culture   Final    NO GROWTH 5 DAYS Performed at [facility], [address], [place], [place] [address]    Report Status 04/27/2023 FINAL  Final  Urine Culture (for pregnant, neutropenic or urologic patients or patients with an indwelling urinary catheter)     Status: Abnormal   Collection Time: 04/24/23 11:25 AM   Specimen: Urine, Clean Catch  Result Value Ref Range Status   Specimen Description   Final    URINE, CLEAN CATCH Performed at [facility], [address], [place], [place] [address]    Special Requests   Final    NONE Performed at [facility], [address] [place], [place] [address]    Culture 50,000 COLONIES/mL YEAST (A)  Final   Report Status 04/26/2023 FINAL  Final  Culture, blood (Routine X 2) w Reflex to ID Panel     Status: None (Preliminary result)   Collection Time: 04/26/23  5:57 AM   Specimen: BLOOD  Result Value Ref Range Status   Specimen Description BLOOD BLOOD RIGHT HAND  Final   Special Requests   Final    BOTTLES DRAWN AEROBIC AND ANAEROBIC Blood Culture adequate volume   Culture   Final    NO GROWTH 1 DAY Performed at [facility], [address], [place], [place] [address]    Report Status PENDING  Incomplete  Culture, blood (Routine X 2) w Reflex to ID Panel     Status: None (Preliminary result)   Collection Time: 04/26/23  6:04 AM   Specimen: BLOOD  Result Value Ref Range Status   Specimen Description BLOOD BLOOD LEFT HAND  Final   Special Requests   Final    BOTTLES DRAWN AEROBIC AND ANAEROBIC Blood Culture adequate volume   Culture   Final    NO GROWTH 1 DAY Performed at [facility], [address], [place], [place] [address]    Report Status PENDING  Incomplete      Radiology Studies last 3 days: ECHOCARDIOGRAM COMPLETE Result Date: 04/27/2023    ECHOCARDIOGRAM REPORT   Patient Name:   RAWLEY HARJU Kahrs Date of Exam: 04/26/2023  Medical Rec #:  [id number]     Height:       64.0 in Accession #:    [id number]    Weight:       170.0 lb Date of Birth:  08/25/1940    BSA:          1.826 m Patient Age:    82 years      BP:           129/77 mmHg Patient Gender: M             HR:           85 bpm. Exam Location:  [facility] Procedure: 2D Echo, Cardiac Doppler and Color Doppler (Both Spectral and Color            Flow Doppler were utilized during procedure). Indications:     Bacteremia R78.81  History:         Patient has prior history of Echocardiogram examinations, most                  recent 03/27/2023. Risk Factors:Dyslipidemia.  Sonographer:     Cristela Blue Referring Phys:  [id number] Sunnie Nielsen Diagnosing Phys: Rozell Searing Custovic IMPRESSIONS  1. Left ventricular ejection fraction, by estimation, is 50 to 55%. The left ventricle has low normal function. The left ventricle has no regional wall motion abnormalities. Left ventricular diastolic parameters were normal.  2. Right ventricular systolic function is normal. The right ventricular size is normal.  3. The mitral valve is normal in structure. Trivial mitral valve regurgitation. No evidence of mitral stenosis.  4. The aortic valve is normal in structure. Aortic valve regurgitation is not visualized. No aortic stenosis is present.  5. The inferior vena cava is normal in size with greater than 50% respiratory variability, suggesting right atrial pressure of 3 mmHg. FINDINGS  Left Ventricle: Left ventricular ejection fraction, by estimation, is 50 to 55%. The left ventricle has low normal function. The left ventricle has no regional wall motion abnormalities. The left ventricular internal cavity size was normal in size. There is no left ventricular hypertrophy. Left ventricular diastolic parameters were normal. Right Ventricle: The right ventricular size is normal. No increase in right ventricular wall thickness. Right ventricular systolic function is normal. Left Atrium: Left atrial size was normal in  size. Right Atrium: Right atrial size was normal in size. Pericardium: There is no evidence of pericardial effusion. Mitral Valve: The mitral valve is normal in structure. Trivial mitral valve regurgitation. No evidence of mitral valve stenosis. MV peak gradient, 4.0 mmHg. The mean mitral valve gradient is 2.0 mmHg. Tricuspid Valve: The tricuspid valve is normal in structure. Tricuspid valve regurgitation is trivial. Aortic Valve: The aortic valve is normal in structure. Aortic valve regurgitation is not visualized. No aortic stenosis is present. Aortic valve mean gradient measures 3.0 mmHg. Aortic valve peak gradient measures 5.0 mmHg. Aortic valve area, by VTI measures 2.52 cm. Pulmonic Valve: The pulmonic valve was normal in structure. Pulmonic valve regurgitation is not visualized. Aorta: The aortic root is normal in size and structure. Venous: The inferior vena cava is normal in size with greater than 50% respiratory variability, suggesting right atrial pressure of 3 mmHg. IAS/Shunts: No atrial level shunt detected by color flow Doppler.  LEFT VENTRICLE PLAX 2D LVIDd:         4.00 cm   Diastology LVIDs:         2.90 cm   LV e' medial:    6.96 cm/s LV PW:         0.90 cm   LV E/e' medial:  13.2 LV IVS:        1.30 cm   LV e' lateral:   12.10 cm/s LVOT diam:     2.10 cm   LV E/e' lateral: 7.6 LV SV:         56 LV SV Index:   31 LVOT Area:     3.46 cm  RIGHT VENTRICLE RV Basal diam:  4.30 cm RV Mid diam:    3.00 cm RV S prime:     13.30 cm/s TAPSE (M-mode): 1.7 cm LEFT ATRIUM  Index       RIGHT ATRIUM           Index LA diam:      2.20 cm 1.21 cm/m  RA Area:     15.60 cm LA Vol (A2C): 13.7 ml 7.50 ml/m  RA Volume:   42.10 ml  23.06 ml/m LA Vol (A4C): 18.2 ml 9.97 ml/m  AORTIC VALVE AV Area (Vmax):    2.39 cm AV Area (Vmean):   2.60 cm AV Area (VTI):     2.52 cm AV Vmax:           112.00 cm/s AV Vmean:          76.100 cm/s AV VTI:            0.221 m AV Peak Grad:      5.0 mmHg AV Mean Grad:       3.0 mmHg LVOT Vmax:         77.40 cm/s LVOT Vmean:        57.200 cm/s LVOT VTI:          0.161 m LVOT/AV VTI ratio: 0.73  AORTA Ao Root diam: 3.50 cm MITRAL VALVE                TRICUSPID VALVE MV Area (PHT): 4.21 cm     TR Peak grad:   20.4 mmHg MV Area VTI:   2.43 cm     TR Vmax:        226.00 cm/s MV Peak grad:  4.0 mmHg MV Mean grad:  2.0 mmHg     SHUNTS MV Vmax:       1.00 m/s     Systemic VTI:  0.16 m MV Vmean:      68.7 cm/s    Systemic Diam: 2.10 cm MV Decel Time: 180 msec MV E velocity: 91.70 cm/s MV A velocity: 102.00 cm/s MV E/A ratio:  0.90 Sabina Custovic Electronically signed by Clotilde Dieter Signature Date/Time: 04/27/2023/10:37:49 AM    Final         Sunnie Nielsen, DO [facility] 04/27/2023, 3:53 PM    Dictation software may have been used to generate the above note. Typos may occur and escape review in typed/dictated notes. Please contact Dr Lyn Hollingshead directly for clarity if needed.  Staff may message me via secure chat in Epic  but this may not receive an immediate response,  please [place] me for urgent matters!  If 7PM-7AM, please contact night coverage [email or web address]

## 2023-04-28 DIAGNOSIS — A419 Sepsis, unspecified organism: Secondary | ICD-10-CM | POA: Diagnosis not present

## 2023-04-28 LAB — CBC
HCT: 32.4 % — ABNORMAL LOW (ref 39.0–52.0)
Hemoglobin: 10.9 g/dL — ABNORMAL LOW (ref 13.0–17.0)
MCH: 30.8 pg (ref 26.0–34.0)
MCHC: 33.6 g/dL (ref 30.0–36.0)
MCV: 91.5 fL (ref 80.0–100.0)
Platelets: 256 10*3/uL (ref 150–400)
RBC: 3.54 MIL/uL — ABNORMAL LOW (ref 4.22–5.81)
RDW: 14.8 % (ref 11.5–15.5)
WBC: 8.1 10*3/uL (ref 4.0–10.5)
nRBC: 0 % (ref 0.0–0.2)

## 2023-04-28 LAB — BASIC METABOLIC PANEL WITH GFR
Anion gap: 11 (ref 5–15)
BUN: 8 mg/dL (ref 8–23)
CO2: 21 mmol/L — ABNORMAL LOW (ref 22–32)
Calcium: 8.2 mg/dL — ABNORMAL LOW (ref 8.9–10.3)
Chloride: 105 mmol/L (ref 98–111)
Creatinine, Ser: 1.21 mg/dL (ref 0.61–1.24)
GFR, Estimated: >60 mL/min (ref >60)
Glucose, Bld: 102 mg/dL — ABNORMAL HIGH (ref 70–99)
Potassium: 3.7 mmol/L (ref 3.5–5.1)
Sodium: 137 mmol/L (ref 135–145)

## 2023-04-28 LAB — GLUCOSE, CAPILLARY
Glucose-Capillary: 113 mg/dL — ABNORMAL HIGH (ref 70–99)
Glucose-Capillary: 125 mg/dL — ABNORMAL HIGH (ref 70–99)
Glucose-Capillary: 108 mg/dL — ABNORMAL HIGH (ref 70–99)
Glucose-Capillary: 141 mg/dL — ABNORMAL HIGH (ref 70–99)

## 2023-04-28 NOTE — Plan of Care (Signed)
  Problem: Nutrition: Goal: Adequate nutrition will be maintained Outcome: Progressing   Problem: Coping: Goal: Level of anxiety will decrease Outcome: Progressing   Problem: Pain Managment: Goal: General experience of comfort will improve and/or be controlled Outcome: Progressing   Problem: Safety: Goal: Ability to remain free from injury will improve Outcome: Progressing

## 2023-04-28 NOTE — Progress Notes (Signed)
 Physical Therapy Treatment Patient Details Name: Derek Lynch MRN: [id number] DOB: 1940-04-22 Today's Date: 04/28/2023   History of Present Illness Pt is an 83 yo male that presented to ED for weakness at home, had been home from SNF for 3 days, workup for UTI, sepsis.  PMH of HFpEF, DM, HTN, HLD.    PT Comments  Patient is agreeable to PT session with encouragement. He needs physical assistance with bed mobility and to stand. Standing tolerance limited for progression of ambulation with heavy reliance on rolling walker for support. Activity tolerance limited by fatigue. Recommend to continue PT to maximize independence and decrease caregiver burden. Rehabilitation < 3 hours/day recommended after this hospital stay.    If plan is discharge home, recommend the following: Two people to help with walking and/or transfers;Two people to help with bathing/dressing/bathroom;Help with stairs or ramp for entrance;Assist for transportation;Assistance with cooking/housework   Can travel by private vehicle     No  Equipment Recommendations  None recommended by PT    Recommendations for Other Services       Precautions / Restrictions Precautions Precautions: Fall Restrictions Weight Bearing Restrictions Per Provider Order: No     Mobility  Bed Mobility Overal bed mobility: Needs Assistance Bed Mobility: Sit to Supine, Rolling Rolling: Min assist   Supine to sit: Min assist Sit to supine: Max assist   General bed mobility comments: verbal cues for technique. + person assistance required for boosting up in the bed    Transfers Overall transfer level: Needs assistance Equipment used: Rolling walker (2 wheels) Transfers: Sit to/from Stand Sit to Stand: Mod assist, From elevated surface           General transfer comment: lifting assistance required for standing. verbal cues for sequencing. patient able to scoot along edge of bed with CGA with cues for technique     Ambulation/Gait               General Gait Details: not attempted due to limited standing tolerance and fatigue with minimal activity   Stairs             Wheelchair Mobility     Tilt Bed    Modified Rankin (Stroke Patients Only)       Balance Overall balance assessment: Needs assistance Sitting-balance support: Feet supported, Bilateral upper extremity supported Sitting balance-Leahy Scale: Fair     Standing balance support: Bilateral upper extremity supported, During functional activity, Reliant on assistive device for balance Standing balance-Leahy Scale: Poor Standing balance comment: external support required to maintain standing balance with heavy reliance on rolling walker                            Communication Communication Communication: Impaired Factors Affecting Communication: Hearing impaired  Cognition Arousal: Alert Behavior During Therapy: Flat affect   PT - Cognitive impairments: No family/caregiver present to determine baseline Difficult to assess due to: Hard of hearing/deaf                     PT - Cognition Comments: needs encouragement to participate. cues for task initiation and sequencing Following commands: Impaired Following commands impaired: Follows one step commands with increased time    Cueing Cueing Techniques: Verbal cues, Tactile cues  Exercises      General Comments General comments (skin integrity, edema, etc.): patient was incontinent of bowel with assistance required for pericare and bed change  Pertinent Vitals/Pain Pain Assessment Pain Assessment: Faces Faces Pain Scale: Hurts a little bit Pain Location: generalized Pain Descriptors / Indicators: Moaning Pain Intervention(s): Monitored during session, Limited activity within patient's tolerance, Repositioned    Home Living                          Prior Function            PT Goals (current goals can now be  found in the care plan section) Acute Rehab PT Goals Patient Stated Goal: did not state PT Goal Formulation: With patient Time For Goal Achievement: 05/10/23 Potential to Achieve Goals: Fair Progress towards PT goals: Progressing toward goals    Frequency    Min 1X/week      PT Plan      Co-evaluation PT/OT/SLP Co-Evaluation/Treatment: Yes Reason for Co-Treatment: Necessary to address cognition/behavior during functional activity;For patient/therapist safety;To address functional/ADL transfers PT goals addressed during session: Mobility/safety with mobility;Proper use of DME OT goals addressed during session: ADL's and self-care      AM-PAC PT "6 Clicks" Mobility   Outcome Measure  Help needed turning from your back to your side while in a flat bed without using bedrails?: A Little Help needed moving from lying on your back to sitting on the side of a flat bed without using bedrails?: A Lot Help needed moving to and from a bed to a chair (including a wheelchair)?: A Lot Help needed standing up from a chair using your arms (e.g., wheelchair or bedside chair)?: A Lot Help needed to walk in hospital room?: A Lot Help needed climbing 3-5 steps with a railing? : Total 6 Click Score: 12    End of Session   Activity Tolerance: Patient tolerated treatment well;Patient limited by fatigue Patient left: in bed;with call bell/phone within reach;with bed alarm set Nurse Communication: Mobility status PT Visit Diagnosis: Unsteadiness on feet (R26.81);Other abnormalities of gait and mobility (R26.89);Repeated falls (R29.6);Muscle weakness (generalized) (M62.81);History of falling (Z91.81);Difficulty in walking, not elsewhere classified (R26.2)     Time: 1610-9604 PT Time Calculation (min) (ACUTE ONLY): 23 min  Charges:    $Therapeutic Activity: 8-22 mins PT General Charges $$ ACUTE PT VISIT: 1 Visit                     Donna Bernard, PT, MPT    Ina Homes 04/28/2023,  1:45 PM

## 2023-04-28 NOTE — TOC Progression Note (Signed)
 Transition of Care [facility]) - Progression Note    Patient Details  Name: OLIVIER FRAYRE MRN: [id number] Date of Birth: [birth year]  Transition of [facility] CM/SW Contact  Chapman Fitch, RN Phone Number: 04/28/2023, 3:56 PM  Clinical Narrative:     Per Amalia Hailey at the [facility], due to patient's current mobility status patient will have to go to SNF prior to ALF  VM left for friend [name] to present bed offers       Expected Discharge Plan and Services                                               Social Determinants of Health (SDOH) Interventions SDOH Screenings   Food Insecurity: Patient Unable To Answer (04/24/2023)  Housing: Patient Unable To Answer (04/24/2023)  Transportation Needs: Patient Unable To Answer (04/24/2023)  Utilities: Not At Risk (04/24/2023)  Financial Resource Strain: Low Risk  (10/29/2022)   Received from [facility]  Social Connections: Unknown (04/24/2023)  Tobacco Use: Medium Risk (03/26/2023)    Readmission Risk Interventions     No data to display

## 2023-04-28 NOTE — Progress Notes (Signed)
 Occupational Therapy Treatment Patient Details Name: Derek Lynch MRN: [id number] DOB: July 28, 1940 Today's Date: 04/28/2023   History of present illness Pt is an 83 yo male that presented to ED for weakness at home, had been home from SNF for 3 days, workup for UTI, sepsis.  PMH of HFpEF, DM, HTN, HLD.   OT comments  OT/PT co-tx to maximize functional outcomes. Pt requires encouragement to participate. Bed mobility performed with minA supine > sit, maxA to transition after mobility. Pt found to be incontinent with large BM, requires MAX A +2 for pericare and linen change. Pt continues to be limited by cognition, vision, decreased tolerance to activity and is a risk for falls. Pt will require 24/7 supervision at next LOC due to functional impairments. Discharge recommendation appropriate.       If plan is discharge home, recommend the following:  Two people to help with walking and/or transfers;Two people to help with bathing/dressing/bathroom;Assistance with cooking/housework;Assist for transportation;Help with stairs or ramp for entrance;Direct supervision/assist for financial management;Direct supervision/assist for medications management;Supervision due to cognitive status   Equipment Recommendations  Other (comment)       Precautions / Restrictions Precautions Precautions: Fall Restrictions Weight Bearing Restrictions Per Provider Order: No       Mobility Bed Mobility Overal bed mobility: Needs Assistance Bed Mobility: Sit to Supine, Rolling Rolling: Min assist   Supine to sit: Min assist Sit to supine: Max assist        Transfers Overall transfer level: Needs assistance Equipment used: Rolling walker (2 wheels) Transfers: Sit to/from Stand Sit to Stand: Mod assist, From elevated surface                 Balance Overall balance assessment: Needs assistance Sitting-balance support: Feet supported, Bilateral upper extremity supported Sitting balance-Leahy Scale:  Fair     Standing balance support: Bilateral upper extremity supported, During functional activity, Reliant on assistive device for balance Standing balance-Leahy Scale: Poor Standing balance comment: external support required to maintain standing balance with heavy reliance on rolling walker                           ADL either performed or assessed with clinical judgement   ADL Overall ADL's : Needs assistance/impaired     Grooming: Sitting;Wash/dry hands;Wash/dry face Grooming Details (indicate cue type and reason): bed level                               General ADL Comments: MAX A +2 for bed level pericare after incontinent BM     Communication Communication Communication: Impaired Factors Affecting Communication: Hearing impaired   Cognition Arousal: Alert Behavior During Therapy: Flat affect                                 Following commands: Impaired Following commands impaired: Follows one step commands with increased time      Cueing   Cueing Techniques: Verbal cues, Tactile cues        General Comments bed change provided    Pertinent Vitals/ Pain       Pain Assessment Pain Assessment: Faces Faces Pain Scale: Hurts a little bit Pain Location: generalized Pain Descriptors / Indicators: Moaning Pain Intervention(s): Monitored during session   Frequency  Min 2X/week        Progress Toward  Goals  OT Goals(current goals can now be found in the care plan section)  Progress towards OT goals: Progressing toward goals  Acute Rehab OT Goals OT Goal Formulation: Patient unable to participate in goal setting Time For Goal Achievement: 05/10/23 Potential to Achieve Goals: Fair ADL Goals Pt Will Perform Eating: with set-up;sitting Pt Will Perform Grooming: with set-up;sitting Pt Will Perform Lower Body Dressing: with min assist;sit to/from stand Pt Will Transfer to Toilet: with min assist;ambulating;stand pivot  transfer;regular height toilet;bedside commode Pt Will Perform Toileting - Clothing Manipulation and hygiene: with min assist;sit to/from stand  Plan      Co-evaluation    PT/OT/SLP Co-Evaluation/Treatment: Yes Reason for Co-Treatment: Necessary to address cognition/behavior during functional activity;For patient/therapist safety;To address functional/ADL transfers PT goals addressed during session: Mobility/safety with mobility;Proper use of DME OT goals addressed during session: ADL's and self-care      AM-PAC OT "6 Clicks" Daily Activity     Outcome Measure   Help from another person eating meals?: A Little Help from another person taking care of personal grooming?: A Little Help from another person toileting, which includes using toliet, bedpan, or urinal?: A Lot Help from another person bathing (including washing, rinsing, drying)?: A Lot Help from another person to put on and taking off regular upper body clothing?: A Lot Help from another person to put on and taking off regular lower body clothing?: Total 6 Click Score: 13    End of Session Equipment Utilized During Treatment: Rolling walker (2 wheels);Gait belt  OT Visit Diagnosis: Unsteadiness on feet (R26.81);Muscle weakness (generalized) (M62.81);History of falling (Z91.81)   Activity Tolerance Patient tolerated treatment well   Patient Left in bed;with call bell/phone within reach;with bed alarm set   Nurse Communication Mobility status        Time: 1610-9604 OT Time Calculation (min): 23 min  Charges: OT General Charges $OT Visit: 1 Visit OT Treatments $Self Care/Home Management : 8-22 mins  Delmon Andrada L. Azell Bill, OTR/L  04/28/23, 3:30 PM

## 2023-04-28 NOTE — Plan of Care (Signed)
  Problem: Fluid Volume: Goal: Hemodynamic stability will improve Outcome: Progressing   Problem: Clinical Measurements: Goal: Diagnostic test results will improve Outcome: Progressing Goal: Signs and symptoms of infection will decrease Outcome: Progressing   Problem: Respiratory: Goal: Ability to maintain adequate ventilation will improve Outcome: Progressing   Problem: Education: Goal: Ability to describe self-care measures that may prevent or decrease complications (Diabetes Survival Skills Education) will improve Outcome: Progressing Goal: Individualized Educational Video(s) Outcome: Progressing   Problem: Coping: Goal: Ability to adjust to condition or change in health will improve Outcome: Progressing   Problem: Fluid Volume: Goal: Ability to maintain a balanced intake and output will improve Outcome: Progressing   Problem: Metabolic: Goal: Ability to maintain appropriate glucose levels will improve Outcome: Progressing   Problem: Health Behavior/Discharge Planning: Goal: Ability to identify and utilize available resources and services will improve Outcome: Progressing Goal: Ability to manage health-related needs will improve Outcome: Progressing   Problem: Nutritional: Goal: Maintenance of adequate nutrition will improve Outcome: Progressing Goal: Progress toward achieving an optimal weight will improve Outcome: Progressing

## 2023-04-28 NOTE — TOC Progression Note (Signed)
 Transition of Care [facility]) - Progression Note    Patient Details  Name: Derek Lynch MRN: [id number] Date of Birth: [birth year]  [facility]) CM/SW Contact  Chapman Fitch, RN Phone Number: 04/28/2023, 8:53 AM  Clinical Narrative:     Therapy notes sent to [facility] at the [facility] for review        Expected Discharge Plan and Services                                               Social Determinants of Health (SDOH) Interventions SDOH Screenings   Food Insecurity: Patient Unable To Answer (04/24/2023)  Housing: Patient Unable To Answer (04/24/2023)  Transportation Needs: Patient Unable To Answer (04/24/2023)  Utilities: Not At Risk (04/24/2023)  Financial Resource Strain: Low Risk  (10/29/2022)   Received from [facility]  Social Connections: Unknown (04/24/2023)  Tobacco Use: Medium Risk (03/26/2023)    Readmission Risk Interventions     No data to display

## 2023-04-28 NOTE — Progress Notes (Signed)
 Progress Note    Derek Lynch  PXT:[id number] DOB: Jun 08, 1940  DOA: 04/22/2023 PCP: Cheryll Dessert, FNP      Brief Narrative:    Medical records reviewed and are as summarized below:  Derek Lynch is a 83 y.o. male with medical history significant for osteoarthritis, chronic venous insufficiency, HFpEF, DM2, HTN, HLD. He presented to the emergency room with acute onset of generalized weakness. He was discharged from rehab about 3 days prior, but continued to get weaker w/ hard time getting out of his chair today.  He admits to urinary frequency and dysuria, mild dyspnea    Of note, recent admission 03/07-03/17 for influenza A w/ hypoxia initially requiring BiPAP, rhabdomyolysis, AKI. D/c to SNF.    04/03: to ED, hypotension, tachycardia, tachypnea, elevated WBC, elevated lactic, mild hyperkalemia 5.4, AKI w/ Cr 3.73. CXR question R pleural effusion. Started on IV cefepime, vancomycin, IV fluids. UA concern for UTI.  04/04: improved WBC, Cr, K, VS. BCx(+) strep, pending ID/susceptibilities, deescalate abx to rocephin only for now. Awaiting culture results.  04/05: UCx never sent so collected today. BCx pending ID. AKI improving.  04/06: AKI continues to improve. UCx pending BCx (+) strep vestibularis d/w ID, plan echo and repeat BCx tomorrow, cont IV abx   04/07: ID to see today. Echo pending. Repeat BCx obtained. UCx (+)yeast 04/08: per ID, no source for bacteremia, do not need TEE. Recs for abd [place] eval liver/ascites.         Assessment/Plan:   Principal Problem:   Sepsis due to undetermined organism [facility]) Active Problems:   AKI (acute kidney injury) (HCC)   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 (HCC)   Dyslipidemia   Essential hypertension    Body mass index is 29.18 kg/m.  (Overweight)    Sepsis due to undetermined organism  Severe sepsis (AKI)  Urinary Tract Infection UCX (+)yeast BCx(+) strep vestibularis Echo no vegetation He was started on  IV ceftriaxone on 04/23/2023.  Plan to complete 7 days of IV ceftriaxone on 04/29/2023.  Initially treated with IV vancomycin, cefepime and metronidazole on 04/22/2023. He was given 1 dose of oral Diflucan on 04/27/2023 because of yeast in urine culture and known diabetic status. He was seen by ID specialist on 04/26/2023 TTE ok, per ID no TEE needed  No growth on repeat blood culture from 04/26/2023.    AKI (acute kidney injury) -improved Baseline GFR >60 no CKD likely prerenal due to volume depletion and dehydration. Associated with mild metabolic acidosis which has resolved. Avoid nephrotoxins Monitor BMP from time to time   UCx (+)yeast Question contaminant but given DM2 he was given 1 dose of oral Diflucan on 04/27/2023   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  SSI continue Actos.   Essential hypertension Lisinopril and Lasix are still on hold because of recent AKI and hypotension   Dyslipidemia continue statin    General Weakness s/p fall at home Awaiting placement to SNF    Diet Order             DIET DYS 3 Room service appropriate? Yes; Fluid consistency: Thin  Diet effective now                            Consultants: ID specialist  Procedures: None    Medications:    vitamin C  1,000 mg Oral Daily   aspirin EC  81 mg Oral Daily  cholecalciferol  1,000 Units Oral Daily   heparin injection (subcutaneous)  5,000 Units Subcutaneous Q8H   insulin aspart  0-15 Units Subcutaneous TID WC   insulin aspart  0-5 Units Subcutaneous QHS   pioglitazone  15 mg Oral Daily   pravastatin  20 mg Oral q1800   tuberculin  5 Units Intradermal Once   Continuous Infusions:  cefTRIAXone (ROCEPHIN)  IV 2 g (04/27/23 1801)     Anti-infectives (From admission, onward)    Start     Dose/Rate Route Frequency Ordered Stop   04/27/23 0930  fluconazole (DIFLUCAN) tablet 150 mg        150 mg Oral  Once 04/27/23 0836 04/27/23 1002   04/23/23 2000  ceFEPIme  (MAXIPIME) 2 g in sodium chloride 0.9 % 100 mL IVPB  Status:  Discontinued        2 g 200 mL/hr over 30 Minutes Intravenous  Once 04/22/23 2033 04/22/23 2045   04/23/23 2000  ceFEPIme (MAXIPIME) 2 g in sodium chloride 0.9 % 100 mL IVPB  Status:  Discontinued        2 g 200 mL/hr over 30 Minutes Intravenous Every 24 hours 04/22/23 2045 04/23/23 0912   04/23/23 1800  cefTRIAXone (ROCEPHIN) 2 g in sodium chloride 0.9 % 100 mL IVPB        2 g 200 mL/hr over 30 Minutes Intravenous Every 24 hours 04/23/23 0912     04/22/23 2100  metroNIDAZOLE (FLAGYL) IVPB 500 mg  Status:  Discontinued        500 mg 100 mL/hr over 60 Minutes Intravenous Every 12 hours 04/22/23 2033 04/23/23 0912   04/22/23 2100  vancomycin (VANCOREADY) IVPB 750 mg/150 mL  Status:  Discontinued        750 mg 150 mL/hr over 60 Minutes Intravenous  Once 04/22/23 2045 04/23/23 0912   04/22/23 2046  vancomycin variable dose per unstable renal function (pharmacist dosing)  Status:  Discontinued         Does not apply See admin instructions 04/22/23 2046 04/23/23 0912   04/22/23 2045  vancomycin (VANCOCIN) IVPB 1000 mg/200 mL premix  Status:  Discontinued        1,000 mg 200 mL/hr over 60 Minutes Intravenous  Once 04/22/23 2033 04/22/23 2037   04/22/23 2043  vancomycin variable dose per unstable renal function (pharmacist dosing)  Status:  Discontinued         Does not apply See admin instructions 04/22/23 2045 04/22/23 2046   04/22/23 1745  ceFEPIme (MAXIPIME) 2 g in sodium chloride 0.9 % 100 mL IVPB        2 g 200 mL/hr over 30 Minutes Intravenous  Once 04/22/23 1742 04/22/23 1854   04/22/23 1745  vancomycin (VANCOCIN) IVPB 1000 mg/200 mL premix        1,000 mg 200 mL/hr over 60 Minutes Intravenous  Once 04/22/23 1742 04/22/23 1954              Family Communication/Anticipated D/C date and plan/Code Status   DVT prophylaxis: heparin injection 5,000 Units Start: 04/23/23 2200     Code Status: Limited: Do not attempt  resuscitation (DNR) -DNR-LIMITED -Do Not Intubate/DNI   Family Communication: None Disposition Plan: Plan to discharge to SNF   Status is: Inpatient Remains inpatient appropriate because: Awaiting placement to SNF       Subjective:   Interval events noted.  Objective:    Vitals:   04/27/23 1526 04/27/23 2100 04/28/23 0407 04/28/23 0819  BP: 135/77  136/77 136/80 (!) 140/71  Pulse: 88 88 83 81  Resp: 16 16 16 17   Temp: 98.6 F (37 C) 98.6 F (37 C) 98.4 F (36.9 C) 98.5 F (36.9 C)  TempSrc: Oral  Oral   SpO2: 97% 98% 96% 98%  Weight:      Height:       No data found.   Intake/Output Summary (Last 24 hours) at 04/28/2023 1119 Last data filed at 04/28/2023 0000 Gross per 24 hour  Intake 240 ml  Output 540 ml  Net -300 ml   Filed Weights   04/22/23 1700  Weight: 77.1 kg    Exam:  GEN: NAD SKIN: Warm and dry EYES: No pallor or icterus ENT: MMM CV: RRR PULM: CTA B ABD: soft, ND, NT, +BS CNS: AAO x 2 (person and place), non focal EXT: No edema or tenderness      Data Reviewed:   I have personally reviewed following labs and imaging studies:  Labs: Labs show the following:   Basic Metabolic Panel: Recent Labs  Lab 04/24/23 0504 04/25/23 0516 04/26/23 0557 04/27/23 0925 04/28/23 0419  NA 140 139 138 134* 137  K 4.2 4.3 3.8 3.6 3.7  CL 110 108 106 103 105  CO2 22 24 20* 21* 21*  GLUCOSE 99 105* 90 90 102*  BUN 29* 21 13 9 8   CREATININE 2.03* 1.61* 1.52* 1.36* 1.21  CALCIUM 8.0* 7.9* 7.9* 8.0* 8.2*   GFR Estimated Creatinine Clearance: 45.7 mL/min (by C-G formula based on SCr of 1.21 mg/dL). Liver Function Tests: Recent Labs  Lab 04/22/23 1732  AST 22  ALT 16  ALKPHOS 57  BILITOT 0.8  PROT 7.7  ALBUMIN 3.4*   No results for input(s): "LIPASE", "AMYLASE" in the last 168 hours. No results for input(s): "AMMONIA" in the last 168 hours. Coagulation profile Recent Labs  Lab 04/22/23 1732 04/23/23 0450  INR 1.2 1.3*     CBC: Recent Labs  Lab 04/22/23 1732 04/23/23 0450 04/28/23 0419  WBC 15.2* 8.2 8.1  NEUTROABS 12.8*  --   --   HGB 13.3 10.4* 10.9*  HCT 40.4 30.7* 32.4*  MCV 95.3 94.2 91.5  PLT 278 175 256   Cardiac Enzymes: No results for input(s): "CKTOTAL", "CKMB", "CKMBINDEX", "TROPONINI" in the last 168 hours. BNP (last 3 results) No results for input(s): "PROBNP" in the last 8760 hours. CBG: Recent Labs  Lab 04/27/23 0744 04/27/23 1147 04/27/23 1624 04/27/23 2058 04/28/23 0820  GLUCAP 85 125* 79 147* 113*   D-Dimer: No results for input(s): "DDIMER" in the last 72 hours. Hgb A1c: No results for input(s): "HGBA1C" in the last 72 hours. Lipid Profile: No results for input(s): "CHOL", "HDL", "LDLCALC", "TRIG", "CHOLHDL", "LDLDIRECT" in the last 72 hours. Thyroid function studies: No results for input(s): "TSH", "T4TOTAL", "T3FREE", "THYROIDAB" in the last 72 hours.  Invalid input(s): "FREET3" Anemia work up: No results for input(s): "VITAMINB12", "FOLATE", "FERRITIN", "TIBC", "IRON", "RETICCTPCT" in the last 72 hours. Sepsis Labs: Recent Labs  Lab 04/22/23 1732 04/22/23 1837 04/23/23 0450 04/28/23 0419  WBC 15.2*  --  8.2 8.1  LATICACIDVEN 4.1* 4.4*  --   --     Microbiology Recent Results (from the past 240 hours)  Culture, blood (Routine x 2)     Status: Abnormal   Collection Time: 04/22/23  5:20 PM   Specimen: BLOOD  Result Value Ref Range Status   Specimen Description   Final    BLOOD RAC Performed at [facility]  Lab, [address], [place], [place] [address]    Special Requests   Final    BOTTLES DRAWN AEROBIC AND ANAEROBIC Blood Culture results may not be optimal due to an inadequate volume of blood received in culture bottles Performed at [facility], [address], [place], [place] [address]    Culture  Setup Time   Final    ANAEROBIC BOTTLE ONLY GRAM POSITIVE COCCI CRITICAL RESULT CALLED TO, READ BACK BY AND VERIFIED WITH: TREY  GREENWOOD @0736  04/23/23 MJU Performed at [facility], [address] [place], [place] [address]    Culture STREPTOCOCCUS VESTIBULARIS (A)  Final   Report Status 04/25/2023 FINAL  Final   Organism ID, Bacteria STREPTOCOCCUS VESTIBULARIS  Final      Susceptibility   Streptococcus vestibularis - MIC*    PENICILLIN <=0.06 SENSITIVE Sensitive     CEFTRIAXONE <=0.12 SENSITIVE Sensitive     ERYTHROMYCIN <=0.12 SENSITIVE Sensitive     LEVOFLOXACIN 2 SENSITIVE Sensitive     VANCOMYCIN 1 SENSITIVE Sensitive     * STREPTOCOCCUS VESTIBULARIS  Blood Culture ID Panel (Reflexed)     Status: Abnormal   Collection Time: 04/22/23  5:20 PM  Result Value Ref Range Status   Enterococcus faecalis NOT DETECTED NOT DETECTED Final   Enterococcus Faecium NOT DETECTED NOT DETECTED Final   Listeria monocytogenes NOT DETECTED NOT DETECTED Final   Staphylococcus species NOT DETECTED NOT DETECTED Final   Staphylococcus aureus (BCID) NOT DETECTED NOT DETECTED Final   Staphylococcus epidermidis NOT DETECTED NOT DETECTED Final   Staphylococcus lugdunensis NOT DETECTED NOT DETECTED Final   Streptococcus species DETECTED (A) NOT DETECTED Final    Comment: Not Enterococcus species, Streptococcus agalactiae, Streptococcus pyogenes, or Streptococcus pneumoniae. CRITICAL RESULT CALLED TO, READ BACK BY AND VERIFIED WITH: TREY GREENWOOD @0736  04/23/23 MJU    Streptococcus agalactiae NOT DETECTED NOT DETECTED Final   Streptococcus pneumoniae NOT DETECTED NOT DETECTED Final   Streptococcus pyogenes NOT DETECTED NOT DETECTED Final   A.calcoaceticus-baumannii NOT DETECTED NOT DETECTED Final   Bacteroides fragilis NOT DETECTED NOT DETECTED Final   Enterobacterales NOT DETECTED NOT DETECTED Final   Enterobacter cloacae complex NOT DETECTED NOT DETECTED Final   Escherichia coli NOT DETECTED NOT DETECTED Final   Klebsiella aerogenes NOT DETECTED NOT DETECTED Final   Klebsiella oxytoca NOT DETECTED NOT DETECTED Final    Klebsiella pneumoniae NOT DETECTED NOT DETECTED Final   Proteus species NOT DETECTED NOT DETECTED Final   Salmonella species NOT DETECTED NOT DETECTED Final   Serratia marcescens NOT DETECTED NOT DETECTED Final   Haemophilus influenzae NOT DETECTED NOT DETECTED Final   Neisseria meningitidis NOT DETECTED NOT DETECTED Final   Pseudomonas aeruginosa NOT DETECTED NOT DETECTED Final   Stenotrophomonas maltophilia NOT DETECTED NOT DETECTED Final   Candida albicans NOT DETECTED NOT DETECTED Final   Candida auris NOT DETECTED NOT DETECTED Final   Candida glabrata NOT DETECTED NOT DETECTED Final   Candida krusei NOT DETECTED NOT DETECTED Final   Candida parapsilosis NOT DETECTED NOT DETECTED Final   Candida tropicalis NOT DETECTED NOT DETECTED Final   Cryptococcus neoformans/gattii NOT DETECTED NOT DETECTED Final    Comment: Performed at [facility], [address] [place], [place] [address]  Culture, blood (Routine x 2)     Status: None   Collection Time: 04/22/23  6:37 PM   Specimen: BLOOD LEFT HAND  Result Value Ref Range Status   Specimen Description BLOOD LEFT HAND AEROBIC BOTTLE ONLY  Final  Special Requests   Final    AEROBIC BOTTLE ONLY Blood Culture results may not be optimal due to an inadequate volume of blood received in culture bottles   Culture   Final    NO GROWTH 5 DAYS Performed at [facility], [address] [place], [place] [address]    Report Status 04/27/2023 FINAL  Final  Urine Culture (for pregnant, neutropenic or urologic patients or patients with an indwelling urinary catheter)     Status: Abnormal   Collection Time: 04/24/23 11:25 AM   Specimen: Urine, Clean Catch  Result Value Ref Range Status   Specimen Description   Final    URINE, CLEAN CATCH Performed at [facility], [address], [place], [place] [address]    Special Requests   Final    NONE Performed at [facility], [address] [place], [place]  [address]    Culture 50,000 COLONIES/mL YEAST (A)  Final   Report Status 04/26/2023 FINAL  Final  Culture, blood (Routine X 2) w Reflex to ID Panel     Status: None (Preliminary result)   Collection Time: 04/26/23  5:57 AM   Specimen: BLOOD  Result Value Ref Range Status   Specimen Description BLOOD BLOOD RIGHT HAND  Final   Special Requests   Final    BOTTLES DRAWN AEROBIC AND ANAEROBIC Blood Culture adequate volume   Culture   Final    NO GROWTH 2 DAYS Performed at [facility], [address] [place], [place] [address]    Report Status PENDING  Incomplete  Culture, blood (Routine X 2) w Reflex to ID Panel     Status: None (Preliminary result)   Collection Time: 04/26/23  6:04 AM   Specimen: BLOOD  Result Value Ref Range Status   Specimen Description BLOOD BLOOD LEFT HAND  Final   Special Requests   Final    BOTTLES DRAWN AEROBIC AND ANAEROBIC Blood Culture adequate volume   Culture   Final    NO GROWTH 2 DAYS Performed at [facility], [address] [place], [place] [address]    Report Status PENDING  Incomplete    Procedures and diagnostic studies:  US Abdomen Limited RUQ (LIVER/GB) Result Date: 04/27/2023 CLINICAL DATA:  Hepatomegaly. EXAM: ULTRASOUND ABDOMEN LIMITED RIGHT UPPER QUADRANT COMPARISON:  CT 03/26/2023 FINDINGS: Gallbladder: Physiologically distended. No gallstones or wall thickening visualized. No sonographic [place] sign noted by sonographer. Common bile duct: Diameter: 3 mm, normal. Liver: No focal lesion identified. Diffusely increased and coarsened in parenchymal echogenicity. No capsular nodularity. Portal vein is patent on color Doppler imaging with normal direction of blood flow towards the liver. The liver was not discretely measured, but was not enlarged on prior CT. Other: No right upper quadrant ascites. IMPRESSION: 1. Hepatic steatosis. The liver was not discretely measured, but was not enlarged on CT last month. 2. Normal sonographic  appearance of the gallbladder and biliary tree. Electronically Signed   By: Narda Rutherford M.D.   On: 04/27/2023 21:19               LOS: 5 days   Lyncoln Ledgerwood  [facility]   Pager on [email or web address]. If 7PM-7AM, please contact night-coverage at [email or web address]     04/28/2023, 11:19 AM

## 2023-04-29 DIAGNOSIS — R7881 Bacteremia: Secondary | ICD-10-CM | POA: Diagnosis not present

## 2023-04-29 DIAGNOSIS — B954 Other streptococcus as the cause of diseases classified elsewhere: Secondary | ICD-10-CM | POA: Diagnosis not present

## 2023-04-29 DIAGNOSIS — A419 Sepsis, unspecified organism: Secondary | ICD-10-CM | POA: Diagnosis not present

## 2023-04-29 DIAGNOSIS — N179 Acute kidney failure, unspecified: Secondary | ICD-10-CM | POA: Diagnosis not present

## 2023-04-29 LAB — GLUCOSE, CAPILLARY
Glucose-Capillary: 100 mg/dL — ABNORMAL HIGH (ref 70–99)
Glucose-Capillary: 160 mg/dL — ABNORMAL HIGH (ref 70–99)
Glucose-Capillary: 123 mg/dL — ABNORMAL HIGH (ref 70–99)
Glucose-Capillary: 124 mg/dL — ABNORMAL HIGH (ref 70–99)

## 2023-04-29 NOTE — Plan of Care (Signed)
  Problem: Respiratory: Goal: Ability to maintain adequate ventilation will improve Outcome: Progressing   Problem: Coping: Goal: Ability to adjust to condition or change in health will improve Outcome: Progressing   Problem: Coping: Goal: Level of anxiety will decrease Outcome: Progressing   Problem: Pain Managment: Goal: General experience of comfort will improve and/or be controlled Outcome: Progressing   Problem: Safety: Goal: Ability to remain free from injury will improve Outcome: Progressing

## 2023-04-29 NOTE — TOC Progression Note (Signed)
 Transition of Care [facility]) - Progression Note    Patient Details  Name: Derek Lynch MRN: [id number] Date of Birth: [birth year]  [facility] of [facility]) CM/SW Contact  Chapman Fitch, RN Phone Number: 04/29/2023, 4:02 PM  Clinical Narrative:      Sherron Monday with [facility] and provided Bed offers.   [facility] which is her 1st preference is considering.  Message left for Stanton Kidney at [facility] requesting return call to review      Expected Discharge Plan and Services                                               Social Determinants of Health (SDOH) Interventions SDOH Screenings   Food Insecurity: Patient Unable To Answer (04/24/2023)  Housing: Patient Unable To Answer (04/24/2023)  Transportation Needs: Patient Unable To Answer (04/24/2023)  Utilities: Not At Risk (04/24/2023)  Financial Resource Strain: Low Risk  (10/29/2022)   Received from [facility]  Social Connections: Unknown (04/24/2023)  Tobacco Use: Medium Risk (03/26/2023)    Readmission Risk Interventions     No data to display

## 2023-04-29 NOTE — Progress Notes (Signed)
 ID Pt stable Patient Vitals for the past 24 hrs:  BP Temp Temp src Pulse Resp SpO2  04/29/23 2009 (!) 135/94 98.5 F (36.9 C) Oral 93 20 95 %  04/29/23 1524 (!) 140/72 98.6 F (37 C) Oral 88 20 97 %  04/29/23 1500 117/72 98.7 F (37.1 C) Oral 85 16 --  04/29/23 0747 (!) 144/93 98.2 F (36.8 C) Oral 73 18 96 %  04/29/23 0411 132/84 98.1 F (36.7 C) Oral 88 18 97 %    Chest b/l air entry Hss1s And soft B/l venous staining of leg  Labs    Latest Ref Rng & Units 04/28/2023    4:19 AM 04/23/2023    4:50 AM 04/22/2023    5:32 PM  CBC  WBC 4.0 - 10.5 K/uL 8.1  8.2  15.2   Hemoglobin 13.0 - 17.0 g/dL 16.1  09.6  04.5   Hematocrit 39.0 - 52.0 % 32.4  30.7  40.4   Platelets 150 - 400 K/uL 256  175  278        Latest Ref Rng & Units 04/28/2023    4:19 AM 04/27/2023    9:25 AM 04/26/2023    5:57 AM  CMP  Glucose 70 - 99 mg/dL 409  90  90   BUN 8 - 23 mg/dL 8  9  13    Creatinine 0.61 - 1.24 mg/dL 8.11  9.14  7.82   Sodium 135 - 145 mmol/L 137  134  138   Potassium 3.5 - 5.1 mmol/L 3.7  3.6  3.8   Chloride 98 - 111 mmol/L 105  103  106   CO2 22 - 32 mmol/L 21  21  20    Calcium 8.9 - 10.3 mg/dL 8.2  8.0  7.9      Micro 4/3/5 1 of 4 streptococcus vestibularis 4/7 BC NG  Impresison/recommendation 83 yr male presenting with weakness , hypotension, AKI after being discharged from SNF He was recently in hospital 3/7-3/17 for fall, Flu A, pneumonia, encephalopathy and CHF   Sepsis like picture on admission-    Streptococcus vestibularis bacteremia 1/4  There is no obvious focus identified Could be transient bacteremia or a contaminant 2 d echo valves are okay No need for Tee Repeat culture neg Today he will complete 7 days of Iv antibiotic- can be Dc after that ?? UTI Urine culture not sent on admission   AKI much improved Check for incomplete bladder emptying Bladder scan only 126     Hypertension- was low on admission- AM cortisol okay   Venous edema legs     ID will  sign off- call if needed

## 2023-04-29 NOTE — Progress Notes (Signed)
 Mobility Specialist - Progress Note   04/29/23 1141  Mobility  Activity Transferred from bed to chair  Level of Assistance Minimal assist, patient does 75% or more (+2)  Assistive Device Front wheel walker  Distance Ambulated (ft) 4 ft  Activity Response Tolerated well  Mobility visit 1 Mobility  Mobility Specialist Start Time (ACUTE ONLY) 1130  Mobility Specialist Stop Time (ACUTE ONLY) 1138  Mobility Specialist Time Calculation (min) (ACUTE ONLY) 8 min   Pt supine upon entry, utilizing RA. Pt agreeable to OOB activity this date, endorsing mild pain-- notable more motivated this date. Pt completed bed mob HHA to bring trunk from sup to sit, dangled EOB for ~2 mins--- min groining while seated EOB d/t pain. Pt STS to RW MinA +2 and transferred to the recliner CGA-- cueing for proper hand placement upon descent. Pt left seated in the recliner with alarm set and needs within reach.  Zetta Bills Mobility Specialist 04/29/23 11:53 AM

## 2023-04-29 NOTE — Progress Notes (Signed)
 Progress Note    Derek Lynch  HWE:[id number] DOB: 08-Apr-1940  DOA: 04/22/2023 PCP: Cheryll Dessert, FNP      Brief Narrative:    Medical records reviewed and are as summarized below:  Derek Lynch is a 83 y.o. male with medical history significant for osteoarthritis, chronic venous insufficiency, HFpEF, DM2, HTN, HLD. He presented to the emergency room with acute onset of generalized weakness. He was discharged from rehab about 3 days prior, but continued to get weaker w/ hard time getting out of his chair today.  He admits to urinary frequency and dysuria, mild dyspnea    Of note, recent admission 03/07-03/17 for influenza A w/ hypoxia initially requiring BiPAP, rhabdomyolysis, AKI. D/c to SNF.    04/03: to ED, hypotension, tachycardia, tachypnea, elevated WBC, elevated lactic, mild hyperkalemia 5.4, AKI w/ Cr 3.73. CXR question R pleural effusion. Started on IV cefepime, vancomycin, IV fluids. UA concern for UTI.  04/04: improved WBC, Cr, K, VS. BCx(+) strep, pending ID/susceptibilities, deescalate abx to rocephin only for now. Awaiting culture results.  04/05: UCx never sent so collected today. BCx pending ID. AKI improving.  04/06: AKI continues to improve. UCx pending BCx (+) strep vestibularis d/w ID, plan echo and repeat BCx tomorrow, cont IV abx   04/07: ID to see today. Echo pending. Repeat BCx obtained. UCx (+)yeast 04/08: per ID, no source for bacteremia, do not need TEE. Recs for abd [place] eval liver/ascites.         Assessment/Plan:   Principal Problem:   Sepsis due to undetermined organism [facility]) Active Problems:   AKI (acute kidney injury) (HCC)   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 (HCC)   Dyslipidemia   Essential hypertension    Body mass index is 29.18 kg/m.  (Overweight)    Sepsis due to undetermined organism  Severe sepsis (AKI)  Urinary Tract Infection UCX (+)yeast BCx(+) strep vestibularis Echo no vegetation He was started on  IV ceftriaxone on 04/23/2023.  Plan to complete 7 days of IV ceftriaxone today. Initially treated with IV vancomycin, cefepime and metronidazole on 04/22/2023. He was given 1 dose of oral Diflucan on 04/27/2023 because of yeast in urine culture and known diabetic status. He was seen by ID specialist on 04/26/2023 TTE ok, per ID no TEE needed  No growth on repeat blood culture from 04/26/2023.    AKI (acute kidney injury) -improved Baseline GFR >60 no CKD likely prerenal due to volume depletion and dehydration. Associated with mild metabolic acidosis which has resolved. Avoid nephrotoxins Monitor BMP from time to time   UCx (+)yeast Question contaminant but given DM2 he was given 1 dose of oral Diflucan on 04/27/2023   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  SSI continue Actos.   Essential hypertension Lisinopril and Lasix are still on hold because of recent AKI and hypotension   Dyslipidemia continue statin    General Weakness s/p fall at home Awaiting placement to SNF  Medically stable for discharge.  Diet Order             DIET DYS 3 Room service appropriate? Yes; Fluid consistency: Thin  Diet effective now                            Consultants: ID specialist  Procedures: None    Medications:    vitamin C  1,000 mg Oral Daily   aspirin EC  81 mg Oral Daily  cholecalciferol  1,000 Units Oral Daily   heparin injection (subcutaneous)  5,000 Units Subcutaneous Q8H   insulin aspart  0-15 Units Subcutaneous TID WC   insulin aspart  0-5 Units Subcutaneous QHS   pioglitazone  15 mg Oral Daily   pravastatin  20 mg Oral q1800   tuberculin  5 Units Intradermal Once   Continuous Infusions:  cefTRIAXone (ROCEPHIN)  IV 2 g (04/28/23 1824)     Anti-infectives (From admission, onward)    Start     Dose/Rate Route Frequency Ordered Stop   04/27/23 0930  fluconazole (DIFLUCAN) tablet 150 mg        150 mg Oral  Once 04/27/23 0836 04/27/23 1002    04/23/23 2000  ceFEPIme (MAXIPIME) 2 g in sodium chloride 0.9 % 100 mL IVPB  Status:  Discontinued        2 g 200 mL/hr over 30 Minutes Intravenous  Once 04/22/23 2033 04/22/23 2045   04/23/23 2000  ceFEPIme (MAXIPIME) 2 g in sodium chloride 0.9 % 100 mL IVPB  Status:  Discontinued        2 g 200 mL/hr over 30 Minutes Intravenous Every 24 hours 04/22/23 2045 04/23/23 0912   04/23/23 1800  cefTRIAXone (ROCEPHIN) 2 g in sodium chloride 0.9 % 100 mL IVPB        2 g 200 mL/hr over 30 Minutes Intravenous Every 24 hours 04/23/23 0912 04/30/23 1759   04/22/23 2100  metroNIDAZOLE (FLAGYL) IVPB 500 mg  Status:  Discontinued        500 mg 100 mL/hr over 60 Minutes Intravenous Every 12 hours 04/22/23 2033 04/23/23 0912   04/22/23 2100  vancomycin (VANCOREADY) IVPB 750 mg/150 mL  Status:  Discontinued        750 mg 150 mL/hr over 60 Minutes Intravenous  Once 04/22/23 2045 04/23/23 0912   04/22/23 2046  vancomycin variable dose per unstable renal function (pharmacist dosing)  Status:  Discontinued         Does not apply See admin instructions 04/22/23 2046 04/23/23 0912   04/22/23 2045  vancomycin (VANCOCIN) IVPB 1000 mg/200 mL premix  Status:  Discontinued        1,000 mg 200 mL/hr over 60 Minutes Intravenous  Once 04/22/23 2033 04/22/23 2037   04/22/23 2043  vancomycin variable dose per unstable renal function (pharmacist dosing)  Status:  Discontinued         Does not apply See admin instructions 04/22/23 2045 04/22/23 2046   04/22/23 1745  ceFEPIme (MAXIPIME) 2 g in sodium chloride 0.9 % 100 mL IVPB        2 g 200 mL/hr over 30 Minutes Intravenous  Once 04/22/23 1742 04/22/23 1854   04/22/23 1745  vancomycin (VANCOCIN) IVPB 1000 mg/200 mL premix        1,000 mg 200 mL/hr over 60 Minutes Intravenous  Once 04/22/23 1742 04/22/23 1954              Family Communication/Anticipated D/C date and plan/Code Status   DVT prophylaxis: heparin injection 5,000 Units Start: 04/23/23 2200      Code Status: Limited: Do not attempt resuscitation (DNR) -DNR-LIMITED -Do Not Intubate/DNI   Family Communication: None Disposition Plan: Plan to discharge to SNF   Status is: Inpatient Remains inpatient appropriate because: Awaiting placement to SNF       Subjective:   Interval events noted.  He has no complaints. Alcario Drought, RN and student nurse were at the bedside.  Objective:  Vitals:   04/28/23 1657 04/28/23 1950 04/29/23 0411 04/29/23 0747  BP: 133/78 133/77 132/84 (!) 144/93  Pulse: 76 89 88 73  Resp: 17 18 18 18   Temp: 98.2 F (36.8 C) 97.9 F (36.6 C) 98.1 F (36.7 C) 98.2 F (36.8 C)  TempSrc:  Oral Oral Oral  SpO2: 100% 96% 97% 96%  Weight:      Height:       No data found.   Intake/Output Summary (Last 24 hours) at 04/29/2023 1447 Last data filed at 04/29/2023 1300 Gross per 24 hour  Intake 0 ml  Output 875 ml  Net -875 ml   Filed Weights   04/22/23 1700  Weight: 77.1 kg    Exam:   GEN: NAD SKIN: Warm and dry EYES: No pallor or icterus ENT: MMM CV: RRR PULM: CTA B ABD: soft, ND, NT, +BS CNS: AAO x person and situation, non focal EXT: No edema or tenderness      Data Reviewed:   I have personally reviewed following labs and imaging studies:  Labs: Labs show the following:   Basic Metabolic Panel: Recent Labs  Lab 04/24/23 0504 04/25/23 0516 04/26/23 0557 04/27/23 0925 04/28/23 0419  NA 140 139 138 134* 137  K 4.2 4.3 3.8 3.6 3.7  CL 110 108 106 103 105  CO2 22 24 20* 21* 21*  GLUCOSE 99 105* 90 90 102*  BUN 29* 21 13 9 8   CREATININE 2.03* 1.61* 1.52* 1.36* 1.21  CALCIUM 8.0* 7.9* 7.9* 8.0* 8.2*   GFR Estimated Creatinine Clearance: 45.7 mL/min (by C-G formula based on SCr of 1.21 mg/dL). Liver Function Tests: Recent Labs  Lab 04/22/23 1732  AST 22  ALT 16  ALKPHOS 57  BILITOT 0.8  PROT 7.7  ALBUMIN 3.4*   No results for input(s): "LIPASE", "AMYLASE" in the last 168 hours. No results for input(s): "AMMONIA"  in the last 168 hours. Coagulation profile Recent Labs  Lab 04/22/23 1732 04/23/23 0450  INR 1.2 1.3*    CBC: Recent Labs  Lab 04/22/23 1732 04/23/23 0450 04/28/23 0419  WBC 15.2* 8.2 8.1  NEUTROABS 12.8*  --   --   HGB 13.3 10.4* 10.9*  HCT 40.4 30.7* 32.4*  MCV 95.3 94.2 91.5  PLT 278 175 256   Cardiac Enzymes: No results for input(s): "CKTOTAL", "CKMB", "CKMBINDEX", "TROPONINI" in the last 168 hours. BNP (last 3 results) No results for input(s): "PROBNP" in the last 8760 hours. CBG: Recent Labs  Lab 04/28/23 1139 04/28/23 1658 04/28/23 2045 04/29/23 0745 04/29/23 1139  GLUCAP 125* 108* 141* 100* 160*   D-Dimer: No results for input(s): "DDIMER" in the last 72 hours. Hgb A1c: No results for input(s): "HGBA1C" in the last 72 hours. Lipid Profile: No results for input(s): "CHOL", "HDL", "LDLCALC", "TRIG", "CHOLHDL", "LDLDIRECT" in the last 72 hours. Thyroid function studies: No results for input(s): "TSH", "T4TOTAL", "T3FREE", "THYROIDAB" in the last 72 hours.  Invalid input(s): "FREET3" Anemia work up: No results for input(s): "VITAMINB12", "FOLATE", "FERRITIN", "TIBC", "IRON", "RETICCTPCT" in the last 72 hours. Sepsis Labs: Recent Labs  Lab 04/22/23 1732 04/22/23 1837 04/23/23 0450 04/28/23 0419  WBC 15.2*  --  8.2 8.1  LATICACIDVEN 4.1* 4.4*  --   --     Microbiology Recent Results (from the past 240 hours)  Culture, blood (Routine x 2)     Status: Abnormal   Collection Time: 04/22/23  5:20 PM   Specimen: BLOOD  Result Value Ref Range Status  Specimen Description   Final    BLOOD RAC Performed at [facility], [address] [place], [place] [address]    Special Requests   Final    BOTTLES DRAWN AEROBIC AND ANAEROBIC Blood Culture results may not be optimal due to an inadequate volume of blood received in culture bottles Performed at [facility], [address] [place], [place] [address]    Culture  Setup Time   Final     ANAEROBIC BOTTLE ONLY GRAM POSITIVE COCCI CRITICAL RESULT CALLED TO, READ BACK BY AND VERIFIED WITH: TREY GREENWOOD @0736  04/23/23 MJU Performed at [facility], [address] [place], [place] [address]    Culture STREPTOCOCCUS VESTIBULARIS (A)  Final   Report Status 04/25/2023 FINAL  Final   Organism ID, Bacteria STREPTOCOCCUS VESTIBULARIS  Final      Susceptibility   Streptococcus vestibularis - MIC*    PENICILLIN <=0.06 SENSITIVE Sensitive     CEFTRIAXONE <=0.12 SENSITIVE Sensitive     ERYTHROMYCIN <=0.12 SENSITIVE Sensitive     LEVOFLOXACIN 2 SENSITIVE Sensitive     VANCOMYCIN 1 SENSITIVE Sensitive     * STREPTOCOCCUS VESTIBULARIS  Blood Culture ID Panel (Reflexed)     Status: Abnormal   Collection Time: 04/22/23  5:20 PM  Result Value Ref Range Status   Enterococcus faecalis NOT DETECTED NOT DETECTED Final   Enterococcus Faecium NOT DETECTED NOT DETECTED Final   Listeria monocytogenes NOT DETECTED NOT DETECTED Final   Staphylococcus species NOT DETECTED NOT DETECTED Final   Staphylococcus aureus (BCID) NOT DETECTED NOT DETECTED Final   Staphylococcus epidermidis NOT DETECTED NOT DETECTED Final   Staphylococcus lugdunensis NOT DETECTED NOT DETECTED Final   Streptococcus species DETECTED (A) NOT DETECTED Final    Comment: Not Enterococcus species, Streptococcus agalactiae, Streptococcus pyogenes, or Streptococcus pneumoniae. CRITICAL RESULT CALLED TO, READ BACK BY AND VERIFIED WITH: TREY GREENWOOD @0736  04/23/23 MJU    Streptococcus agalactiae NOT DETECTED NOT DETECTED Final   Streptococcus pneumoniae NOT DETECTED NOT DETECTED Final   Streptococcus pyogenes NOT DETECTED NOT DETECTED Final   A.calcoaceticus-baumannii NOT DETECTED NOT DETECTED Final   Bacteroides fragilis NOT DETECTED NOT DETECTED Final   Enterobacterales NOT DETECTED NOT DETECTED Final   Enterobacter cloacae complex NOT DETECTED NOT DETECTED Final   Escherichia coli NOT DETECTED NOT DETECTED Final    Klebsiella aerogenes NOT DETECTED NOT DETECTED Final   Klebsiella oxytoca NOT DETECTED NOT DETECTED Final   Klebsiella pneumoniae NOT DETECTED NOT DETECTED Final   Proteus species NOT DETECTED NOT DETECTED Final   Salmonella species NOT DETECTED NOT DETECTED Final   Serratia marcescens NOT DETECTED NOT DETECTED Final   Haemophilus influenzae NOT DETECTED NOT DETECTED Final   Neisseria meningitidis NOT DETECTED NOT DETECTED Final   Pseudomonas aeruginosa NOT DETECTED NOT DETECTED Final   Stenotrophomonas maltophilia NOT DETECTED NOT DETECTED Final   Candida albicans NOT DETECTED NOT DETECTED Final   Candida auris NOT DETECTED NOT DETECTED Final   Candida glabrata NOT DETECTED NOT DETECTED Final   Candida krusei NOT DETECTED NOT DETECTED Final   Candida parapsilosis NOT DETECTED NOT DETECTED Final   Candida tropicalis NOT DETECTED NOT DETECTED Final   Cryptococcus neoformans/gattii NOT DETECTED NOT DETECTED Final    Comment: Performed at [facility], [address] [place], [place] [address]  Culture, blood (Routine x 2)     Status: None   Collection Time: 04/22/23  6:37 PM   Specimen: BLOOD LEFT HAND  Result Value Ref  Range Status   Specimen Description BLOOD LEFT HAND AEROBIC BOTTLE ONLY  Final   Special Requests   Final    AEROBIC BOTTLE ONLY Blood Culture results may not be optimal due to an inadequate volume of blood received in culture bottles   Culture   Final    NO GROWTH 5 DAYS Performed at [facility], [address], [place], [place] [address]    Report Status 04/27/2023 FINAL  Final  Urine Culture (for pregnant, neutropenic or urologic patients or patients with an indwelling urinary catheter)     Status: Abnormal   Collection Time: 04/24/23 11:25 AM   Specimen: Urine, Clean Catch  Result Value Ref Range Status   Specimen Description   Final    URINE, CLEAN CATCH Performed at [facility], [address], [place], [place] [address]     Special Requests   Final    NONE Performed at [facility], [address] [place], [place] [address]    Culture 50,000 COLONIES/mL YEAST (A)  Final   Report Status 04/26/2023 FINAL  Final  Culture, blood (Routine X 2) w Reflex to ID Panel     Status: None (Preliminary result)   Collection Time: 04/26/23  5:57 AM   Specimen: BLOOD  Result Value Ref Range Status   Specimen Description BLOOD BLOOD RIGHT HAND  Final   Special Requests   Final    BOTTLES DRAWN AEROBIC AND ANAEROBIC Blood Culture adequate volume   Culture   Final    NO GROWTH 3 DAYS Performed at [facility], [address] [place], [place] [address]    Report Status PENDING  Incomplete  Culture, blood (Routine X 2) w Reflex to ID Panel     Status: None (Preliminary result)   Collection Time: 04/26/23  6:04 AM   Specimen: BLOOD  Result Value Ref Range Status   Specimen Description BLOOD BLOOD LEFT HAND  Final   Special Requests   Final    BOTTLES DRAWN AEROBIC AND ANAEROBIC Blood Culture adequate volume   Culture   Final    NO GROWTH 3 DAYS Performed at [facility], [address] [place], [place] [address]    Report Status PENDING  Incomplete    Procedures and diagnostic studies:  US Abdomen Limited RUQ (LIVER/GB) Result Date: 04/27/2023 CLINICAL DATA:  Hepatomegaly. EXAM: ULTRASOUND ABDOMEN LIMITED RIGHT UPPER QUADRANT COMPARISON:  CT 03/26/2023 FINDINGS: Gallbladder: Physiologically distended. No gallstones or wall thickening visualized. No sonographic Murphy sign noted by sonographer. Common bile duct: Diameter: 3 mm, normal. Liver: No focal lesion identified. Diffusely increased and coarsened in parenchymal echogenicity. No capsular nodularity. Portal vein is patent on color Doppler imaging with normal direction of blood flow towards the liver. The liver was not discretely measured, but was not enlarged on prior CT. Other: No right upper quadrant ascites. IMPRESSION: 1. Hepatic  steatosis. The liver was not discretely measured, but was not enlarged on CT last month. 2. Normal sonographic appearance of the gallbladder and biliary tree. Electronically Signed   By: Narda Rutherford M.D.   On: 04/27/2023 21:19               LOS: 6 days   Elynore Dolinski  [facility]   Pager on [email or web address]. If 7PM-7AM, please contact night-coverage at [email or web address]     04/29/2023, 2:47 PM

## 2023-04-29 NOTE — Plan of Care (Signed)
°  Problem: Fluid Volume: °Goal: Hemodynamic stability will improve °Outcome: Progressing °  °Problem: Clinical Measurements: °Goal: Diagnostic test results will improve °Outcome: Progressing °Goal: Signs and symptoms of infection will decrease °Outcome: Progressing °  °

## 2023-04-30 DIAGNOSIS — A419 Sepsis, unspecified organism: Secondary | ICD-10-CM | POA: Diagnosis not present

## 2023-04-30 LAB — GLUCOSE, CAPILLARY
Glucose-Capillary: 126 mg/dL — ABNORMAL HIGH (ref 70–99)
Glucose-Capillary: 175 mg/dL — ABNORMAL HIGH (ref 70–99)
Glucose-Capillary: 159 mg/dL — ABNORMAL HIGH (ref 70–99)
Glucose-Capillary: 131 mg/dL — ABNORMAL HIGH (ref 70–99)

## 2023-04-30 MED ORDER — LISINOPRIL 2.5 MG PO TABS
2.5000 mg | ORAL_TABLET | Freq: Every day | ORAL | Status: DC
  Administered 2023-04-30 – 2023-05-03 (×4): 2.5 mg via ORAL
  Filled 2023-04-30 (×4): qty 1

## 2023-04-30 NOTE — Progress Notes (Signed)
 Progress Note    Derek Lynch  NFA:[id number] DOB: 1940/11/07  DOA: 04/22/2023 PCP: Cheryll Dessert, FNP      Brief Narrative:    Medical records reviewed and are as summarized below:  Derek Lynch is a 83 y.o. male with medical history significant for osteoarthritis, chronic venous insufficiency, HFpEF, DM2, HTN, HLD. He presented to the emergency room with acute onset of generalized weakness. He was discharged from rehab about 3 days prior, but continued to get weaker w/ hard time getting out of his chair today.  He admits to urinary frequency and dysuria, mild dyspnea    Of note, recent admission 03/07-03/17 for influenza A w/ hypoxia initially requiring BiPAP, rhabdomyolysis, AKI. D/c to SNF.    04/03: to ED, hypotension, tachycardia, tachypnea, elevated WBC, elevated lactic, mild hyperkalemia 5.4, AKI w/ Cr 3.73. CXR question R pleural effusion. Started on IV cefepime, vancomycin, IV fluids. UA concern for UTI.  04/04: improved WBC, Cr, K, VS. BCx(+) strep, pending ID/susceptibilities, deescalate abx to rocephin only for now. Awaiting culture results.  04/05: UCx never sent so collected today. BCx pending ID. AKI improving.  04/06: AKI continues to improve. UCx pending BCx (+) strep vestibularis d/w ID, plan echo and repeat BCx tomorrow, cont IV abx   04/07: ID to see today. Echo pending. Repeat BCx obtained. UCx (+)yeast 04/08: per ID, no source for bacteremia, do not need TEE. Recs for abd [place] eval liver/ascites.         Assessment/Plan:   Principal Problem:   Sepsis due to undetermined organism [facility]) Active Problems:   AKI (acute kidney injury) (HCC)   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 (HCC)   Dyslipidemia   Essential hypertension   Bacteremia due to Streptococcus    Body mass index is 29.18 kg/m.  (Overweight)    Sepsis due to undetermined organism  Severe sepsis (AKI)  Urinary Tract Infection UCX (+)yeast BCx(+) strep vestibularis Echo  no vegetation He was started on IV ceftriaxone on 04/23/2023.  Completed 7 days of ceftriaxone on 04/29/2023. Initially treated with IV vancomycin, cefepime and metronidazole on 04/22/2023. He was given 1 dose of oral Diflucan on 04/27/2023 because of yeast in urine culture and known diabetic status. He was seen by ID specialist. TTE ok, per ID no TEE needed  No growth on repeat blood culture from 04/26/2023.    AKI (acute kidney injury) -improved Baseline GFR >60 no CKD likely prerenal due to volume depletion and dehydration. Associated with mild metabolic acidosis which has resolved. Avoid nephrotoxins Monitor BMP from time to time   UCx (+)yeast Question contaminant but given DM2 he was given 1 dose of oral Diflucan on 04/27/2023   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  SSI continue Actos.   Essential hypertension Restart lisinopril.  Lasix will remain on hold   Dyslipidemia continue statin    General Weakness s/p fall at home Awaiting placement to SNF  Medically stable for discharge.  Diet Order             DIET DYS 3 Room service appropriate? Yes; Fluid consistency: Thin  Diet effective now                            Consultants: ID specialist  Procedures: None    Medications:    vitamin C  1,000 mg Oral Daily   aspirin EC  81 mg Oral Daily   cholecalciferol  1,000 Units Oral Daily   heparin injection (subcutaneous)  5,000 Units Subcutaneous Q8H   insulin aspart  0-15 Units Subcutaneous TID WC   insulin aspart  0-5 Units Subcutaneous QHS   lisinopril  2.5 mg Oral Daily   pioglitazone  15 mg Oral Daily   pravastatin  20 mg Oral q1800   Continuous Infusions:     Anti-infectives (From admission, onward)    Start     Dose/Rate Route Frequency Ordered Stop   04/27/23 0930  fluconazole (DIFLUCAN) tablet 150 mg        150 mg Oral  Once 04/27/23 0836 04/27/23 1002   04/23/23 2000  ceFEPIme (MAXIPIME) 2 g in sodium chloride 0.9 % 100 mL  IVPB  Status:  Discontinued        2 g 200 mL/hr over 30 Minutes Intravenous  Once 04/22/23 2033 04/22/23 2045   04/23/23 2000  ceFEPIme (MAXIPIME) 2 g in sodium chloride 0.9 % 100 mL IVPB  Status:  Discontinued        2 g 200 mL/hr over 30 Minutes Intravenous Every 24 hours 04/22/23 2045 04/23/23 0912   04/23/23 1800  cefTRIAXone (ROCEPHIN) 2 g in sodium chloride 0.9 % 100 mL IVPB        2 g 200 mL/hr over 30 Minutes Intravenous Every 24 hours 04/23/23 0912 04/29/23 1701   04/22/23 2100  metroNIDAZOLE (FLAGYL) IVPB 500 mg  Status:  Discontinued        500 mg 100 mL/hr over 60 Minutes Intravenous Every 12 hours 04/22/23 2033 04/23/23 0912   04/22/23 2100  vancomycin (VANCOREADY) IVPB 750 mg/150 mL  Status:  Discontinued        750 mg 150 mL/hr over 60 Minutes Intravenous  Once 04/22/23 2045 04/23/23 0912   04/22/23 2046  vancomycin variable dose per unstable renal function (pharmacist dosing)  Status:  Discontinued         Does not apply See admin instructions 04/22/23 2046 04/23/23 0912   04/22/23 2045  vancomycin (VANCOCIN) IVPB 1000 mg/200 mL premix  Status:  Discontinued        1,000 mg 200 mL/hr over 60 Minutes Intravenous  Once 04/22/23 2033 04/22/23 2037   04/22/23 2043  vancomycin variable dose per unstable renal function (pharmacist dosing)  Status:  Discontinued         Does not apply See admin instructions 04/22/23 2045 04/22/23 2046   04/22/23 1745  ceFEPIme (MAXIPIME) 2 g in sodium chloride 0.9 % 100 mL IVPB        2 g 200 mL/hr over 30 Minutes Intravenous  Once 04/22/23 1742 04/22/23 1854   04/22/23 1745  vancomycin (VANCOCIN) IVPB 1000 mg/200 mL premix        1,000 mg 200 mL/hr over 60 Minutes Intravenous  Once 04/22/23 1742 04/22/23 1954              Family Communication/Anticipated D/C date and plan/Code Status   DVT prophylaxis: heparin injection 5,000 Units Start: 04/23/23 2200     Code Status: Limited: Do not attempt resuscitation (DNR) -DNR-LIMITED -Do  Not Intubate/DNI   Family Communication: None Disposition Plan: Plan to discharge to SNF   Status is: Inpatient Remains inpatient appropriate because: Awaiting placement to SNF       Subjective:   Interval events noted.  No complaints.  Objective:    Vitals:   04/29/23 1524 04/29/23 2009 04/30/23 0318 04/30/23 0801  BP: (!) 140/72 (!) 135/94 120/77 130/86  Pulse: 88  93 92 89  Resp: 20 20 20 18   Temp: 98.6 F (37 C) 98.5 F (36.9 C) 98.3 F (36.8 C) 98.2 F (36.8 C)  TempSrc: Oral Oral    SpO2: 97% 95% 94% 94%  Weight:      Height:       No data found.   Intake/Output Summary (Last 24 hours) at 04/30/2023 1326 Last data filed at 04/30/2023 1138 Gross per 24 hour  Intake 360 ml  Output 350 ml  Net 10 ml   Filed Weights   04/22/23 1700  Weight: 77.1 kg    Exam:   GEN: NAD, sitting up in the chair SKIN: Warm and dry EYES: No pallor or icterus ENT: MMM CV: RRR PULM: CTA B ABD: soft, ND, NT, +BS CNS: AAO x person and situation, non focal EXT: No edema or tenderness    Data Reviewed:   I have personally reviewed following labs and imaging studies:  Labs: Labs show the following:   Basic Metabolic Panel: Recent Labs  Lab 04/24/23 0504 04/25/23 0516 04/26/23 0557 04/27/23 0925 04/28/23 0419  NA 140 139 138 134* 137  K 4.2 4.3 3.8 3.6 3.7  CL 110 108 106 103 105  CO2 22 24 20* 21* 21*  GLUCOSE 99 105* 90 90 102*  BUN 29* 21 13 9 8   CREATININE 2.03* 1.61* 1.52* 1.36* 1.21  CALCIUM 8.0* 7.9* 7.9* 8.0* 8.2*   GFR Estimated Creatinine Clearance: 45.7 mL/min (by C-G formula based on SCr of 1.21 mg/dL). Liver Function Tests: No results for input(s): "AST", "ALT", "ALKPHOS", "BILITOT", "PROT", "ALBUMIN" in the last 168 hours.  No results for input(s): "LIPASE", "AMYLASE" in the last 168 hours. No results for input(s): "AMMONIA" in the last 168 hours. Coagulation profile No results for input(s): "INR", "PROTIME" in the last 168  hours.   CBC: Recent Labs  Lab 04/28/23 0419  WBC 8.1  HGB 10.9*  HCT 32.4*  MCV 91.5  PLT 256   Cardiac Enzymes: No results for input(s): "CKTOTAL", "CKMB", "CKMBINDEX", "TROPONINI" in the last 168 hours. BNP (last 3 results) No results for input(s): "PROBNP" in the last 8760 hours. CBG: Recent Labs  Lab 04/29/23 1139 04/29/23 1623 04/29/23 2149 04/30/23 0803 04/30/23 1138  GLUCAP 160* 123* 124* 126* 175*   D-Dimer: No results for input(s): "DDIMER" in the last 72 hours. Hgb A1c: No results for input(s): "HGBA1C" in the last 72 hours. Lipid Profile: No results for input(s): "CHOL", "HDL", "LDLCALC", "TRIG", "CHOLHDL", "LDLDIRECT" in the last 72 hours. Thyroid function studies: No results for input(s): "TSH", "T4TOTAL", "T3FREE", "THYROIDAB" in the last 72 hours.  Invalid input(s): "FREET3" Anemia work up: No results for input(s): "VITAMINB12", "FOLATE", "FERRITIN", "TIBC", "IRON", "RETICCTPCT" in the last 72 hours. Sepsis Labs: Recent Labs  Lab 04/28/23 0419  WBC 8.1    Microbiology Recent Results (from the past 240 hours)  Culture, blood (Routine x 2)     Status: Abnormal   Collection Time: 04/22/23  5:20 PM   Specimen: BLOOD  Result Value Ref Range Status   Specimen Description   Final    BLOOD RAC Performed at [facility], [address] [place], [place] [address]    Special Requests   Final    BOTTLES DRAWN AEROBIC AND ANAEROBIC Blood Culture results may not be optimal due to an inadequate volume of blood received in culture bottles Performed at [facility], [address], [place], [place] [address]    Culture  Setup  Time   Final    ANAEROBIC BOTTLE ONLY GRAM POSITIVE COCCI CRITICAL RESULT CALLED TO, READ BACK BY AND VERIFIED WITH: TREY GREENWOOD @0736  04/23/23 MJU Performed at [facility], [address] [place], [place] [address]    Culture STREPTOCOCCUS VESTIBULARIS (A)  Final   Report Status 04/25/2023 FINAL   Final   Organism ID, Bacteria STREPTOCOCCUS VESTIBULARIS  Final      Susceptibility   Streptococcus vestibularis - MIC*    PENICILLIN <=0.06 SENSITIVE Sensitive     CEFTRIAXONE <=0.12 SENSITIVE Sensitive     ERYTHROMYCIN <=0.12 SENSITIVE Sensitive     LEVOFLOXACIN 2 SENSITIVE Sensitive     VANCOMYCIN 1 SENSITIVE Sensitive     * STREPTOCOCCUS VESTIBULARIS  Blood Culture ID Panel (Reflexed)     Status: Abnormal   Collection Time: 04/22/23  5:20 PM  Result Value Ref Range Status   Enterococcus faecalis NOT DETECTED NOT DETECTED Final   Enterococcus Faecium NOT DETECTED NOT DETECTED Final   Listeria monocytogenes NOT DETECTED NOT DETECTED Final   Staphylococcus species NOT DETECTED NOT DETECTED Final   Staphylococcus aureus (BCID) NOT DETECTED NOT DETECTED Final   Staphylococcus epidermidis NOT DETECTED NOT DETECTED Final   Staphylococcus lugdunensis NOT DETECTED NOT DETECTED Final   Streptococcus species DETECTED (A) NOT DETECTED Final    Comment: Not Enterococcus species, Streptococcus agalactiae, Streptococcus pyogenes, or Streptococcus pneumoniae. CRITICAL RESULT CALLED TO, READ BACK BY AND VERIFIED WITH: TREY GREENWOOD @0736  04/23/23 MJU    Streptococcus agalactiae NOT DETECTED NOT DETECTED Final   Streptococcus pneumoniae NOT DETECTED NOT DETECTED Final   Streptococcus pyogenes NOT DETECTED NOT DETECTED Final   A.calcoaceticus-baumannii NOT DETECTED NOT DETECTED Final   Bacteroides fragilis NOT DETECTED NOT DETECTED Final   Enterobacterales NOT DETECTED NOT DETECTED Final   Enterobacter cloacae complex NOT DETECTED NOT DETECTED Final   Escherichia coli NOT DETECTED NOT DETECTED Final   Klebsiella aerogenes NOT DETECTED NOT DETECTED Final   Klebsiella oxytoca NOT DETECTED NOT DETECTED Final   Klebsiella pneumoniae NOT DETECTED NOT DETECTED Final   Proteus species NOT DETECTED NOT DETECTED Final   Salmonella species NOT DETECTED NOT DETECTED Final   Serratia marcescens NOT  DETECTED NOT DETECTED Final   Haemophilus influenzae NOT DETECTED NOT DETECTED Final   Neisseria meningitidis NOT DETECTED NOT DETECTED Final   Pseudomonas aeruginosa NOT DETECTED NOT DETECTED Final   Stenotrophomonas maltophilia NOT DETECTED NOT DETECTED Final   Candida albicans NOT DETECTED NOT DETECTED Final   Candida auris NOT DETECTED NOT DETECTED Final   Candida glabrata NOT DETECTED NOT DETECTED Final   Candida krusei NOT DETECTED NOT DETECTED Final   Candida parapsilosis NOT DETECTED NOT DETECTED Final   Candida tropicalis NOT DETECTED NOT DETECTED Final   Cryptococcus neoformans/gattii NOT DETECTED NOT DETECTED Final    Comment: Performed at [facility], [address] [place], [place] [address]  Culture, blood (Routine x 2)     Status: None   Collection Time: 04/22/23  6:37 PM   Specimen: BLOOD LEFT HAND  Result Value Ref Range Status   Specimen Description BLOOD LEFT HAND AEROBIC BOTTLE ONLY  Final   Special Requests   Final    AEROBIC BOTTLE ONLY Blood Culture results may not be optimal due to an inadequate volume of blood received in culture bottles   Culture   Final    NO GROWTH 5 DAYS Performed at [facility], [address] [place], [place] [address]    Report  Status 04/27/2023 FINAL  Final  Urine Culture (for pregnant, neutropenic or urologic patients or patients with an indwelling urinary catheter)     Status: Abnormal   Collection Time: 04/24/23 11:25 AM   Specimen: Urine, Clean Catch  Result Value Ref Range Status   Specimen Description   Final    URINE, CLEAN CATCH Performed at [facility], [address], [place], [place] [address]    Special Requests   Final    NONE Performed at [facility], [address] [place], [place] [address]    Culture 50,000 COLONIES/mL YEAST (A)  Final   Report Status 04/26/2023 FINAL  Final  Culture, blood (Routine X 2) w Reflex to ID Panel     Status: None (Preliminary result)    Collection Time: 04/26/23  5:57 AM   Specimen: BLOOD  Result Value Ref Range Status   Specimen Description BLOOD BLOOD RIGHT HAND  Final   Special Requests   Final    BOTTLES DRAWN AEROBIC AND ANAEROBIC Blood Culture adequate volume   Culture   Final    NO GROWTH 4 DAYS Performed at [facility], [address], [place], [place] [address]    Report Status PENDING  Incomplete  Culture, blood (Routine X 2) w Reflex to ID Panel     Status: None (Preliminary result)   Collection Time: 04/26/23  6:04 AM   Specimen: BLOOD  Result Value Ref Range Status   Specimen Description BLOOD BLOOD LEFT HAND  Final   Special Requests   Final    BOTTLES DRAWN AEROBIC AND ANAEROBIC Blood Culture adequate volume   Culture   Final    NO GROWTH 4 DAYS Performed at [facility], [address] [place], [place] [address]    Report Status PENDING  Incomplete    Procedures and diagnostic studies:  No results found.              LOS: 7 days   Tarissa Kerin  [facility]   Pager on [email or web address]. If 7PM-7AM, please contact night-coverage at [email or web address]     04/30/2023, 1:26 PM

## 2023-04-30 NOTE — Progress Notes (Signed)
 Physical Therapy Treatment Patient Details Name: Derek Lynch MRN: [id number] DOB: 1940-05-18 Today's Date: 04/30/2023   History of Present Illness Pt is an 83 yo male that presented to ED for weakness at home, had been home from SNF for 3 days, workup for UTI, sepsis.  PMH of HFpEF, DM, HTN, HLD.    PT Comments  Pt alert, oriented to self, reported back pain with all mobility attempts. Pt continued to display self limiting behaviors, but with verbal/tactile cues able to maximize participation. Supine to sit with minA via handheld assist. Pt able to sit for several minutes for RN to administer medication. PT/OT overlap for mobility; ultimately modAx2 to stand with RW, and step pivot minAx2 to recliner in room. Pt did attempt to sit too early/attempted to not transfer to the recliner, intermittent RW guidance and max verbal encouragement needed. Pt up in chair with OT at bedside. The patient would benefit from further skilled PT intervention to continue to progress towards goals.      If plan is discharge home, recommend the following: Two people to help with walking and/or transfers;Two people to help with bathing/dressing/bathroom;Help with stairs or ramp for entrance;Assist for transportation;Assistance with cooking/housework   Can travel by private vehicle     No  Equipment Recommendations  None recommended by PT    Recommendations for Other Services       Precautions / Restrictions Precautions Precautions: Fall Recall of Precautions/Restrictions: Impaired Restrictions Weight Bearing Restrictions Per Provider Order: No     Mobility  Bed Mobility Overal bed mobility: Needs Assistance Bed Mobility: Supine to Sit     Supine to sit: Min assist     General bed mobility comments: supine to sit with minA via handheld assist and encouragement to use bed rail    Transfers Overall transfer level: Needs assistance Equipment used: Rolling walker (2 wheels) Transfers: Sit to/from  Stand Sit to Stand: Mod assist, From elevated surface   Step pivot transfers: Min assist, +2 safety/equipment       General transfer comment: needed constant cueing to finish transferring to recliner, minAx2 and intermittent RW management    Ambulation/Gait                   Stairs             Wheelchair Mobility     Tilt Bed    Modified Rankin (Stroke Patients Only)       Balance Overall balance assessment: Needs assistance Sitting-balance support: Feet supported, Bilateral upper extremity supported Sitting balance-Leahy Scale: Fair Sitting balance - Comments: assistance needed to take meds at EOB   Standing balance support: Bilateral upper extremity supported, During functional activity, Reliant on assistive device for balance   Standing balance comment: external support required to maintain standing balance with heavy reliance on rolling walker                            Communication    Cognition Arousal: Alert Behavior During Therapy: Flat affect                           PT - Cognition Comments: oriented to self, but very self limiting Following commands: Impaired Following commands impaired: Follows one step commands with increased time    Cueing    Exercises      General Comments        Pertinent Vitals/Pain  Pain Assessment Pain Assessment: Faces Faces Pain Scale: Hurts a little bit Pain Location: back Pain Descriptors / Indicators: Sore, Aching Pain Intervention(s): Limited activity within patient's tolerance, Monitored during session, Repositioned    Home Living                          Prior Function            PT Goals (current goals can now be found in the care plan section) Progress towards PT goals: Progressing toward goals    Frequency    Min 1X/week      PT Plan      Co-evaluation              AM-PAC PT "6 Clicks" Mobility   Outcome Measure  Help needed turning  from your back to your side while in a flat bed without using bedrails?: A Little Help needed moving from lying on your back to sitting on the side of a flat bed without using bedrails?: A Lot Help needed moving to and from a bed to a chair (including a wheelchair)?: A Lot Help needed standing up from a chair using your arms (e.g., wheelchair or bedside chair)?: A Lot Help needed to walk in hospital room?: A Lot Help needed climbing 3-5 steps with a railing? : Total 6 Click Score: 12    End of Session   Activity Tolerance: Patient tolerated treatment well Patient left: in chair;with call bell/phone within reach;with chair alarm set Nurse Communication: Mobility status PT Visit Diagnosis: Unsteadiness on feet (R26.81);Other abnormalities of gait and mobility (R26.89);Repeated falls (R29.6);Muscle weakness (generalized) (M62.81);History of falling (Z91.81);Difficulty in walking, not elsewhere classified (R26.2)     Time: 1610-9604 PT Time Calculation (min) (ACUTE ONLY): 13 min  Charges:    $Therapeutic Activity: 8-22 mins PT General Charges $$ ACUTE PT VISIT: 1 Visit                     Olga Coaster PT, DPT 10:30 AM,04/30/23

## 2023-04-30 NOTE — Plan of Care (Signed)

## 2023-04-30 NOTE — TOC Progression Note (Addendum)
 Transition of Care [facility]) - Progression Note    Patient Details  Name: ADVAIT BUICE MRN: [id number] Date of Birth: [birth year]  [facility] CM/SW Contact  Chapman Fitch, RN Phone Number: 04/30/2023, 3:50 PM  Clinical Narrative:      [facility] not able to offer a bed at this time Spoke with [name] and she accepts bed at [facility] Accepted bed and HUB, and notified Ricky at [facility]. Per Clide Cliff they do not accept weekend admissions.  Clide Cliff was not in office today and requested that I notify Toni Amend..  VM left for courtney.   Patient will require insurance auth     Update:  Received call from [place].  She wishes to change the bed she accepted from [facility] to [facility].  Notified [facility].  Accepted [facility] in hub and notified Danella Deis at [facility].  Per Danella Deis they are not accepting beds over the weekend     Expected Discharge Plan and Services                                               Social Determinants of Health (SDOH) Interventions SDOH Screenings   Food Insecurity: Patient Unable To Answer (04/24/2023)  Housing: Patient Unable To Answer (04/24/2023)  Transportation Needs: Patient Unable To Answer (04/24/2023)  Utilities: Not At Risk (04/24/2023)  Financial Resource Strain: Low Risk  (10/29/2022)   Received from [facility]  Social Connections: Unknown (04/24/2023)  Tobacco Use: Medium Risk (03/26/2023)    Readmission Risk Interventions     No data to display

## 2023-04-30 NOTE — Progress Notes (Signed)
 Occupational Therapy Treatment Patient Details Name: Derek Lynch MRN: [id number] DOB: 11/09/1940 Today's Date: 04/30/2023   History of present illness Pt is an 83 yo male that presented to ED for weakness at home, had been home from SNF for 3 days, workup for UTI, sepsis.  PMH of HFpEF, DM, HTN, HLD.   OT comments  Pt continues to require verbal and tactile encouragement to maximize participation. PT/OT overlap for mobility. Pt performs grooming tasks while seated EOB to comb hair, STS with MOD A +2, step pivot MIN A +2 to recliner. Pt limits self-performance during transfer, attempting to sit to early, intermittent RW guidance and vcs needed. OT provides setup of tray, and verbal cues to assist pt with locating breakfast items. Once provided with descriptive cuing, pt able to use L hand to load fork and feed self despite insisting "feed me, feed me". Pt unsafe to discharge home due to deficits in mobility, cognition, strength and balance. Discharge recommendation remains appropriate, patient will benefit from continued inpatient follow up therapy, <3 hours/day       If plan is discharge home, recommend the following:  Two people to help with walking and/or transfers;Two people to help with bathing/dressing/bathroom;Assistance with cooking/housework;Assist for transportation;Help with stairs or ramp for entrance;Direct supervision/assist for financial management;Direct supervision/assist for medications management;Supervision due to cognitive status   Equipment Recommendations  Other (comment)       Precautions / Restrictions Precautions Precautions: Fall Recall of Precautions/Restrictions: Impaired Restrictions Weight Bearing Restrictions Per Provider Order: No       Mobility Bed Mobility Overal bed mobility: Needs Assistance Bed Mobility: Supine to Sit Rolling: Min assist              Transfers Overall transfer level: Needs assistance Equipment used: Rolling walker (2  wheels) Transfers: Sit to/from Stand Sit to Stand: Min assist     Step pivot transfers: Min assist, +2 safety/equipment           Balance Overall balance assessment: Needs assistance Sitting-balance support: Feet supported, Bilateral upper extremity supported Sitting balance-Leahy Scale: Fair     Standing balance support: Bilateral upper extremity supported, During functional activity, Reliant on assistive device for balance Standing balance-Leahy Scale: Poor Standing balance comment: external support required to maintain standing balance with heavy reliance on rolling walker                           ADL either performed or assessed with clinical judgement   ADL Overall ADL's : Needs assistance/impaired Eating/Feeding: Sitting;Cueing for sequencing;Set up Eating/Feeding Details (indicate cue type and reason): OT provides setup of meal tray using clock method; pt refusing to feed himself and requiring max encourgement to initiate task. Once OT provides setup and cuing of item location, pt is able to feed self with L hand. Pt able to locate napkin under other items and successfully rearrange tray and pick up napkins Grooming: Sitting;Wash/dry hands;Wash/dry face Grooming Details (indicate cue type and reason): EOB, max cues for initiation                                     Communication Communication Communication: Impaired Factors Affecting Communication: Hearing impaired   Cognition Arousal: Alert Behavior During Therapy: Flat affect Cognition: Cognition impaired  Following commands: Impaired Following commands impaired: Follows one step commands with increased time      Cueing   Cueing Techniques: Verbal cues, Tactile cues             Pertinent Vitals/ Pain       Pain Assessment Pain Assessment: Faces Faces Pain Scale: Hurts a little bit Pain Location: back Pain Descriptors / Indicators:  Sore, Aching Pain Intervention(s): Limited activity within patient's tolerance   Frequency  Min 2X/week        Progress Toward Goals  OT Goals(current goals can now be found in the care plan section)  Progress towards OT goals: Progressing toward goals  Acute Rehab OT Goals OT Goal Formulation: Patient unable to participate in goal setting Time For Goal Achievement: 05/10/23 Potential to Achieve Goals: Fair ADL Goals Pt Will Perform Eating: with set-up;sitting Pt Will Perform Grooming: with set-up;sitting Pt Will Perform Lower Body Dressing: with min assist;sit to/from stand Pt Will Transfer to Toilet: with min assist;ambulating;stand pivot transfer;regular height toilet;bedside commode Pt Will Perform Toileting - Clothing Manipulation and hygiene: with min assist;sit to/from stand  Plan         AM-PAC OT "6 Clicks" Daily Activity     Outcome Measure   Help from another person eating meals?: A Little Help from another person taking care of personal grooming?: A Little Help from another person toileting, which includes using toliet, bedpan, or urinal?: A Lot Help from another person bathing (including washing, rinsing, drying)?: A Lot Help from another person to put on and taking off regular upper body clothing?: A Lot Help from another person to put on and taking off regular lower body clothing?: Total 6 Click Score: 13    End of Session Equipment Utilized During Treatment: Gait belt  OT Visit Diagnosis: Unsteadiness on feet (R26.81);Muscle weakness (generalized) (M62.81);History of falling (Z91.81)   Activity Tolerance Patient tolerated treatment well   Patient Left in chair;with call bell/phone within reach;with chair alarm set   Nurse Communication Mobility status        Time: 3086-5784 OT Time Calculation (min): 15 min  Charges: OT General Charges $OT Visit: 1 Visit OT Treatments $Self Care/Home Management : 8-22 mins  Danille Oppedisano L. Raymondo Garcialopez, OTR/L   04/30/23, 1:29 PM

## 2023-04-30 NOTE — Plan of Care (Signed)
  Problem: Clinical Measurements: Goal: Diagnostic test results will improve Outcome: Progressing Goal: Signs and symptoms of infection will decrease Outcome: Progressing   Problem: Safety: Goal: Ability to remain free from injury will improve Outcome: Progressing

## 2023-05-01 DIAGNOSIS — A419 Sepsis, unspecified organism: Secondary | ICD-10-CM | POA: Diagnosis not present

## 2023-05-01 LAB — CBC
HCT: 31.6 % — ABNORMAL LOW (ref 39.0–52.0)
Hemoglobin: 10.6 g/dL — ABNORMAL LOW (ref 13.0–17.0)
MCH: 30.6 pg (ref 26.0–34.0)
MCHC: 33.5 g/dL (ref 30.0–36.0)
MCV: 91.3 fL (ref 80.0–100.0)
Platelets: 250 10*3/uL (ref 150–400)
RBC: 3.46 MIL/uL — ABNORMAL LOW (ref 4.22–5.81)
RDW: 15 % (ref 11.5–15.5)
WBC: 7.7 10*3/uL (ref 4.0–10.5)
nRBC: 0 % (ref 0.0–0.2)

## 2023-05-01 LAB — CULTURE, BLOOD (ROUTINE X 2)
Culture: NO GROWTH
Culture: NO GROWTH
Special Requests: ADEQUATE
Special Requests: ADEQUATE

## 2023-05-01 LAB — GLUCOSE, CAPILLARY
Glucose-Capillary: 118 mg/dL — ABNORMAL HIGH (ref 70–99)
Glucose-Capillary: 154 mg/dL — ABNORMAL HIGH (ref 70–99)
Glucose-Capillary: 120 mg/dL — ABNORMAL HIGH (ref 70–99)
Glucose-Capillary: 164 mg/dL — ABNORMAL HIGH (ref 70–99)

## 2023-05-01 NOTE — Plan of Care (Signed)

## 2023-05-01 NOTE — Progress Notes (Signed)
 Progress Note    Derek Lynch  ZOX:[id number] DOB: 03/29/1940  DOA: 04/22/2023 PCP: Alvena Aurora, FNP      Brief Narrative:    Medical records reviewed and are as summarized below:  Derek Lynch is a 83 y.o. male with medical history significant for osteoarthritis, chronic venous insufficiency, HFpEF, DM2, HTN, HLD. He presented to the emergency room with acute onset of generalized weakness. He was discharged from rehab about 3 days prior, but continued to get weaker w/ hard time getting out of his chair today.  He admits to urinary frequency and dysuria, mild dyspnea    Of note, recent admission 03/07-03/17 for influenza A w/ hypoxia initially requiring BiPAP, rhabdomyolysis, AKI. D/c to SNF.    04/03: to ED, hypotension, tachycardia, tachypnea, elevated WBC, elevated lactic, mild hyperkalemia 5.4, AKI w/ Cr 3.73. CXR question R pleural effusion. Started on IV cefepime, vancomycin, IV fluids. UA concern for UTI.  04/04: improved WBC, Cr, K, VS. BCx(+) strep, pending ID/susceptibilities, deescalate abx to rocephin only for now. Awaiting culture results.  04/05: UCx never sent so collected today. BCx pending ID. AKI improving.  04/06: AKI continues to improve. UCx pending BCx (+) strep vestibularis d/w ID, plan echo and repeat BCx tomorrow, cont IV abx   04/07: ID to see today. Echo pending. Repeat BCx obtained. UCx (+)yeast 04/08: per ID, no source for bacteremia, do not need TEE. Recs for abd US  eval liver/ascites.         Assessment/Plan:   Principal Problem:   Sepsis due to undetermined organism [facility]) Active Problems:   AKI (acute kidney injury) (HCC)   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 (HCC)   Dyslipidemia   Essential hypertension   Bacteremia due to Streptococcus    Body mass index is 29.18 kg/m.  (Overweight)    Sepsis due to undetermined organism  Severe sepsis (AKI)  Urinary Tract Infection UCX (+)yeast BCx(+) strep vestibularis Echo  no vegetation He was started on IV ceftriaxone on 04/23/2023.  Completed 7 days of ceftriaxone on 04/29/2023. Initially treated with IV vancomycin, cefepime and metronidazole on 04/22/2023. He was given 1 dose of oral Diflucan on 04/27/2023 because of yeast in urine culture and known diabetic status. He was seen by ID specialist. TTE ok, per ID no TEE needed  No growth on repeat blood culture from 04/26/2023.    AKI (acute kidney injury) -improved Baseline GFR >60 no CKD likely prerenal due to volume depletion and dehydration. Associated with mild metabolic acidosis which has resolved. Avoid nephrotoxins Monitor BMP from time to time   UCx (+)yeast Question contaminant but given DM2 he was given 1 dose of oral Diflucan on 04/27/2023   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  Glucose levels are optimal.  NovoLog as needed for hyperglycemia. continue Actos.   Essential hypertension Continue lisinopril.  Lasix on hold   Dyslipidemia continue statin    General Weakness s/p fall at home Awaiting placement to SNF  Medically stable for discharge.  Diet Order             DIET DYS 3 Room service appropriate? Yes; Fluid consistency: Thin  Diet effective now                            Consultants: ID specialist  Procedures: None    Medications:    vitamin C  1,000 mg Oral Daily   aspirin EC  81  mg Oral Daily   cholecalciferol  1,000 Units Oral Daily   heparin injection (subcutaneous)  5,000 Units Subcutaneous Q8H   insulin aspart  0-15 Units Subcutaneous TID WC   insulin aspart  0-5 Units Subcutaneous QHS   lisinopril  2.5 mg Oral Daily   pioglitazone  15 mg Oral Daily   pravastatin  20 mg Oral q1800   Continuous Infusions:     Anti-infectives (From admission, onward)    Start     Dose/Rate Route Frequency Ordered Stop   04/27/23 0930  fluconazole (DIFLUCAN) tablet 150 mg        150 mg Oral  Once 04/27/23 0836 04/27/23 1002   04/23/23 2000   ceFEPIme (MAXIPIME) 2 g in sodium chloride 0.9 % 100 mL IVPB  Status:  Discontinued        2 g 200 mL/hr over 30 Minutes Intravenous  Once 04/22/23 2033 04/22/23 2045   04/23/23 2000  ceFEPIme (MAXIPIME) 2 g in sodium chloride 0.9 % 100 mL IVPB  Status:  Discontinued        2 g 200 mL/hr over 30 Minutes Intravenous Every 24 hours 04/22/23 2045 04/23/23 0912   04/23/23 1800  cefTRIAXone (ROCEPHIN) 2 g in sodium chloride 0.9 % 100 mL IVPB        2 g 200 mL/hr over 30 Minutes Intravenous Every 24 hours 04/23/23 0912 04/29/23 1701   04/22/23 2100  metroNIDAZOLE (FLAGYL) IVPB 500 mg  Status:  Discontinued        500 mg 100 mL/hr over 60 Minutes Intravenous Every 12 hours 04/22/23 2033 04/23/23 0912   04/22/23 2100  vancomycin (VANCOREADY) IVPB 750 mg/150 mL  Status:  Discontinued        750 mg 150 mL/hr over 60 Minutes Intravenous  Once 04/22/23 2045 04/23/23 0912   04/22/23 2046  vancomycin variable dose per unstable renal function (pharmacist dosing)  Status:  Discontinued         Does not apply See admin instructions 04/22/23 2046 04/23/23 0912   04/22/23 2045  vancomycin (VANCOCIN) IVPB 1000 mg/200 mL premix  Status:  Discontinued        1,000 mg 200 mL/hr over 60 Minutes Intravenous  Once 04/22/23 2033 04/22/23 2037   04/22/23 2043  vancomycin variable dose per unstable renal function (pharmacist dosing)  Status:  Discontinued         Does not apply See admin instructions 04/22/23 2045 04/22/23 2046   04/22/23 1745  ceFEPIme (MAXIPIME) 2 g in sodium chloride 0.9 % 100 mL IVPB        2 g 200 mL/hr over 30 Minutes Intravenous  Once 04/22/23 1742 04/22/23 1854   04/22/23 1745  vancomycin (VANCOCIN) IVPB 1000 mg/200 mL premix        1,000 mg 200 mL/hr over 60 Minutes Intravenous  Once 04/22/23 1742 04/22/23 1954              Family Communication/Anticipated D/C date and plan/Code Status   DVT prophylaxis: heparin injection 5,000 Units Start: 04/23/23 2200     Code Status:  Limited: Do not attempt resuscitation (DNR) -DNR-LIMITED -Do Not Intubate/DNI   Family Communication: None Disposition Plan: Plan to discharge to SNF   Status is: Inpatient Remains inpatient appropriate because: Awaiting placement to SNF       Subjective:   Interval events noted.  No acute issues overnight.  Bertrand Brod, RN, was at the bedside  Objective:    Vitals:   04/30/23 2028 05/01/23  0326 05/01/23 0741 05/01/23 1432  BP: 135/74 (!) 101/56 97/65 112/68  Pulse: 98 97 89 89  Resp: 20 20 15 18   Temp: 98 F (36.7 C) 98.7 F (37.1 C) 97.6 F (36.4 C) 98.4 F (36.9 C)  TempSrc: Oral Oral    SpO2: 94% 94% 95% 95%  Weight:      Height:       No data found.   Intake/Output Summary (Last 24 hours) at 05/01/2023 1526 Last data filed at 05/01/2023 0900 Gross per 24 hour  Intake 120 ml  Output 550 ml  Net -430 ml   Filed Weights   04/22/23 1700  Weight: 77.1 kg    Exam:  GEN: NAD SKIN: Warm and dry EYES: No pallor or icterus ENT: MMM CV: RRR PULM: CTA B ABD: soft, ND, NT, +BS CNS: AAO x 2 (person and place), non focal EXT: No edema or tenderness     Data Reviewed:   I have personally reviewed following labs and imaging studies:  Labs: Labs show the following:   Basic Metabolic Panel: Recent Labs  Lab 04/25/23 0516 04/26/23 0557 04/27/23 0925 04/28/23 0419  NA 139 138 134* 137  K 4.3 3.8 3.6 3.7  CL 108 106 103 105  CO2 24 20* 21* 21*  GLUCOSE 105* 90 90 102*  BUN 21 13 9 8   CREATININE 1.61* 1.52* 1.36* 1.21  CALCIUM 7.9* 7.9* 8.0* 8.2*   GFR Estimated Creatinine Clearance: 45.7 mL/min (by C-G formula based on SCr of 1.21 mg/dL). Liver Function Tests: No results for input(s): "AST", "ALT", "ALKPHOS", "BILITOT", "PROT", "ALBUMIN" in the last 168 hours.  No results for input(s): "LIPASE", "AMYLASE" in the last 168 hours. No results for input(s): "AMMONIA" in the last 168 hours. Coagulation profile No results for input(s): "INR",  "PROTIME" in the last 168 hours.   CBC: Recent Labs  Lab 04/28/23 0419 05/01/23 0510  WBC 8.1 7.7  HGB 10.9* 10.6*  HCT 32.4* 31.6*  MCV 91.5 91.3  PLT 256 250   Cardiac Enzymes: No results for input(s): "CKTOTAL", "CKMB", "CKMBINDEX", "TROPONINI" in the last 168 hours. BNP (last 3 results) No results for input(s): "PROBNP" in the last 8760 hours. CBG: Recent Labs  Lab 04/30/23 1138 04/30/23 1623 04/30/23 2108 05/01/23 0836 05/01/23 1117  GLUCAP 175* 159* 131* 118* 154*   D-Dimer: No results for input(s): "DDIMER" in the last 72 hours. Hgb A1c: No results for input(s): "HGBA1C" in the last 72 hours. Lipid Profile: No results for input(s): "CHOL", "HDL", "LDLCALC", "TRIG", "CHOLHDL", "LDLDIRECT" in the last 72 hours. Thyroid function studies: No results for input(s): "TSH", "T4TOTAL", "T3FREE", "THYROIDAB" in the last 72 hours.  Invalid input(s): "FREET3" Anemia work up: No results for input(s): "VITAMINB12", "FOLATE", "FERRITIN", "TIBC", "IRON", "RETICCTPCT" in the last 72 hours. Sepsis Labs: Recent Labs  Lab 04/28/23 0419 05/01/23 0510  WBC 8.1 7.7    Microbiology Recent Results (from the past 240 hours)  Culture, blood (Routine x 2)     Status: Abnormal   Collection Time: 04/22/23  5:20 PM   Specimen: BLOOD  Result Value Ref Range Status   Specimen Description   Final    BLOOD RAC Performed at [facility], [address], [place], [place] [address]    Special Requests   Final    BOTTLES DRAWN AEROBIC AND ANAEROBIC Blood Culture results may not be optimal due to an inadequate volume of blood received in culture bottles Performed at [facility], [address] [place], [place] [address]    Culture  Setup Time   Final    ANAEROBIC BOTTLE ONLY GRAM POSITIVE COCCI CRITICAL RESULT CALLED TO, READ BACK BY AND VERIFIED WITH: TREY GREENWOOD @0736  04/23/23 MJU Performed at [facility], [address] [place], [place] [address]     Culture STREPTOCOCCUS VESTIBULARIS (A)  Final   Report Status 04/25/2023 FINAL  Final   Organism ID, Bacteria STREPTOCOCCUS VESTIBULARIS  Final      Susceptibility   Streptococcus vestibularis - MIC*    PENICILLIN <=0.06 SENSITIVE Sensitive     CEFTRIAXONE <=0.12 SENSITIVE Sensitive     ERYTHROMYCIN <=0.12 SENSITIVE Sensitive     LEVOFLOXACIN 2 SENSITIVE Sensitive     VANCOMYCIN 1 SENSITIVE Sensitive     * STREPTOCOCCUS VESTIBULARIS  Blood Culture ID Panel (Reflexed)     Status: Abnormal   Collection Time: 04/22/23  5:20 PM  Result Value Ref Range Status   Enterococcus faecalis NOT DETECTED NOT DETECTED Final   Enterococcus Faecium NOT DETECTED NOT DETECTED Final   Listeria monocytogenes NOT DETECTED NOT DETECTED Final   Staphylococcus species NOT DETECTED NOT DETECTED Final   Staphylococcus aureus (BCID) NOT DETECTED NOT DETECTED Final   Staphylococcus epidermidis NOT DETECTED NOT DETECTED Final   Staphylococcus lugdunensis NOT DETECTED NOT DETECTED Final   Streptococcus species DETECTED (A) NOT DETECTED Final    Comment: Not Enterococcus species, Streptococcus agalactiae, Streptococcus pyogenes, or Streptococcus pneumoniae. CRITICAL RESULT CALLED TO, READ BACK BY AND VERIFIED WITH: TREY GREENWOOD @0736  04/23/23 MJU    Streptococcus agalactiae NOT DETECTED NOT DETECTED Final   Streptococcus pneumoniae NOT DETECTED NOT DETECTED Final   Streptococcus pyogenes NOT DETECTED NOT DETECTED Final   A.calcoaceticus-baumannii NOT DETECTED NOT DETECTED Final   Bacteroides fragilis NOT DETECTED NOT DETECTED Final   Enterobacterales NOT DETECTED NOT DETECTED Final   Enterobacter cloacae complex NOT DETECTED NOT DETECTED Final   Escherichia coli NOT DETECTED NOT DETECTED Final   Klebsiella aerogenes NOT DETECTED NOT DETECTED Final   Klebsiella oxytoca NOT DETECTED NOT DETECTED Final   Klebsiella pneumoniae NOT DETECTED NOT DETECTED Final   Proteus species NOT DETECTED NOT DETECTED Final    Salmonella species NOT DETECTED NOT DETECTED Final   Serratia marcescens NOT DETECTED NOT DETECTED Final   Haemophilus influenzae NOT DETECTED NOT DETECTED Final   Neisseria meningitidis NOT DETECTED NOT DETECTED Final   Pseudomonas aeruginosa NOT DETECTED NOT DETECTED Final   Stenotrophomonas maltophilia NOT DETECTED NOT DETECTED Final   Candida albicans NOT DETECTED NOT DETECTED Final   Candida auris NOT DETECTED NOT DETECTED Final   Candida glabrata NOT DETECTED NOT DETECTED Final   Candida krusei NOT DETECTED NOT DETECTED Final   Candida parapsilosis NOT DETECTED NOT DETECTED Final   Candida tropicalis NOT DETECTED NOT DETECTED Final   Cryptococcus neoformans/gattii NOT DETECTED NOT DETECTED Final    Comment: Performed at [facility], [address] [place], [place] [address]  Culture, blood (Routine x 2)     Status: None   Collection Time: 04/22/23  6:37 PM   Specimen: BLOOD LEFT HAND  Result Value Ref Range Status   Specimen Description BLOOD LEFT HAND AEROBIC BOTTLE ONLY  Final   Special Requests   Final    AEROBIC BOTTLE ONLY Blood Culture results may not be optimal due to an inadequate volume of blood received in culture bottles   Culture   Final    NO GROWTH 5 DAYS Performed at [facility],  [address] [place], [place] [address]    Report Status 04/27/2023 FINAL  Final  Urine Culture (for pregnant, neutropenic or urologic patients or patients with an indwelling urinary catheter)     Status: Abnormal   Collection Time: 04/24/23 11:25 AM   Specimen: Urine, Clean Catch  Result Value Ref Range Status   Specimen Description   Final    URINE, CLEAN CATCH Performed at [facility], [address] [place], [place] [address]    Special Requests   Final    NONE Performed at [facility], [address] [place], [place] [address]    Culture 50,000 COLONIES/mL YEAST (A)  Final   Report Status 04/26/2023 FINAL  Final  Culture,  blood (Routine X 2) w Reflex to ID Panel     Status: None   Collection Time: 04/26/23  5:57 AM   Specimen: BLOOD  Result Value Ref Range Status   Specimen Description BLOOD BLOOD RIGHT HAND  Final   Special Requests   Final    BOTTLES DRAWN AEROBIC AND ANAEROBIC Blood Culture adequate volume   Culture   Final    NO GROWTH 5 DAYS Performed at [facility], [address], [place], [place] [address]    Report Status 05/01/2023 FINAL  Final  Culture, blood (Routine X 2) w Reflex to ID Panel     Status: None   Collection Time: 04/26/23  6:04 AM   Specimen: BLOOD  Result Value Ref Range Status   Specimen Description BLOOD BLOOD LEFT HAND  Final   Special Requests   Final    BOTTLES DRAWN AEROBIC AND ANAEROBIC Blood Culture adequate volume   Culture   Final    NO GROWTH 5 DAYS Performed at [facility], [address], [place], [place] [address]    Report Status 05/01/2023 FINAL  Final    Procedures and diagnostic studies:  No results found.              LOS: 8 days   Dyanna Seiter  [facility]   Pager on [email or web address]. If 7PM-7AM, please contact night-coverage at [email or web address]     05/01/2023, 3:26 PM

## 2023-05-02 DIAGNOSIS — A419 Sepsis, unspecified organism: Secondary | ICD-10-CM | POA: Diagnosis not present

## 2023-05-02 LAB — BASIC METABOLIC PANEL WITH GFR
Anion gap: 9 (ref 5–15)
BUN: 7 mg/dL — ABNORMAL LOW (ref 8–23)
CO2: 25 mmol/L (ref 22–32)
Calcium: 9 mg/dL (ref 8.9–10.3)
Chloride: 104 mmol/L (ref 98–111)
Creatinine, Ser: 1.2 mg/dL (ref 0.61–1.24)
GFR, Estimated: >60 mL/min (ref >60)
Glucose, Bld: 118 mg/dL — ABNORMAL HIGH (ref 70–99)
Potassium: 4.1 mmol/L (ref 3.5–5.1)
Sodium: 138 mmol/L (ref 135–145)

## 2023-05-02 LAB — GLUCOSE, CAPILLARY
Glucose-Capillary: 121 mg/dL — ABNORMAL HIGH (ref 70–99)
Glucose-Capillary: 137 mg/dL — ABNORMAL HIGH (ref 70–99)
Glucose-Capillary: 148 mg/dL — ABNORMAL HIGH (ref 70–99)
Glucose-Capillary: 127 mg/dL — ABNORMAL HIGH (ref 70–99)

## 2023-05-02 NOTE — Progress Notes (Signed)
 Progress Note    Derek Lynch  XLK:[id number] DOB: Dec 11, 1940  DOA: 04/22/2023 PCP: Alvena Aurora, FNP      Brief Narrative:    Medical records reviewed and are as summarized below:  Derek Lynch is a 83 y.o. male with medical history significant for osteoarthritis, chronic venous insufficiency, HFpEF, DM2, HTN, HLD. He presented to the emergency room with acute onset of generalized weakness. He was discharged from rehab about 3 days prior, but continued to get weaker w/ hard time getting out of his chair today.  He admits to urinary frequency and dysuria, mild dyspnea    Of note, recent admission 03/07-03/17 for influenza A w/ hypoxia initially requiring BiPAP, rhabdomyolysis, AKI. D/c to SNF.    04/03: to ED, hypotension, tachycardia, tachypnea, elevated WBC, elevated lactic, mild hyperkalemia 5.4, AKI w/ Cr 3.73. CXR question R pleural effusion. Started on IV cefepime, vancomycin, IV fluids. UA concern for UTI.  04/04: improved WBC, Cr, K, VS. BCx(+) strep, pending ID/susceptibilities, deescalate abx to rocephin only for now. Awaiting culture results.  04/05: UCx never sent so collected today. BCx pending ID. AKI improving.  04/06: AKI continues to improve. UCx pending BCx (+) strep vestibularis d/w ID, plan echo and repeat BCx tomorrow, cont IV abx   04/07: ID to see today. Echo pending. Repeat BCx obtained. UCx (+)yeast 04/08: per ID, no source for bacteremia, do not need TEE. Recs for abd US  eval liver/ascites.         Assessment/Plan:   Principal Problem:   Sepsis due to undetermined organism [facility]) Active Problems:   AKI (acute kidney injury) (HCC)   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 (HCC)   Dyslipidemia   Essential hypertension   Bacteremia due to Streptococcus    Body mass index is 29.18 kg/m.  (Overweight)    Sepsis due to undetermined organism  Severe sepsis (AKI)  Urinary Tract Infection UCX (+)yeast BCx(+) strep vestibularis Echo  no vegetation He was started on IV ceftriaxone on 04/23/2023.  Completed 7 days of ceftriaxone on 04/29/2023. Initially treated with IV vancomycin, cefepime and metronidazole on 04/22/2023. He was given 1 dose of oral Diflucan on 04/27/2023 because of yeast in urine culture and known diabetic status. He was seen by ID specialist. TTE ok, per ID no TEE needed  No growth on repeat blood culture from 04/26/2023.    AKI (acute kidney injury) -improved Baseline GFR >60 no CKD likely prerenal due to volume depletion and dehydration. Associated with mild metabolic acidosis which has resolved. Avoid nephrotoxins Check BMP today   UCx (+)yeast Question contaminant but given DM2 he was given 1 dose of oral Diflucan on 04/27/2023   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  Glucose levels are optimal.  NovoLog as needed for hyperglycemia. continue Actos.   Essential hypertension Continue lisinopril.  Lasix on hold   Dyslipidemia continue statin    General Weakness s/p fall at home Awaiting placement to SNF  Medically stable for discharge.  Diet Order             DIET DYS 3 Room service appropriate? Yes; Fluid consistency: Thin  Diet effective now                            Consultants: ID specialist  Procedures: None    Medications:    vitamin C  1,000 mg Oral Daily   aspirin EC  81 mg Oral Daily  cholecalciferol  1,000 Units Oral Daily   heparin injection (subcutaneous)  5,000 Units Subcutaneous Q8H   insulin aspart  0-15 Units Subcutaneous TID WC   insulin aspart  0-5 Units Subcutaneous QHS   lisinopril  2.5 mg Oral Daily   pioglitazone  15 mg Oral Daily   pravastatin  20 mg Oral q1800   Continuous Infusions:     Anti-infectives (From admission, onward)    Start     Dose/Rate Route Frequency Ordered Stop   04/27/23 0930  fluconazole (DIFLUCAN) tablet 150 mg        150 mg Oral  Once 04/27/23 0836 04/27/23 1002   04/23/23 2000  ceFEPIme (MAXIPIME)  2 g in sodium chloride 0.9 % 100 mL IVPB  Status:  Discontinued        2 g 200 mL/hr over 30 Minutes Intravenous  Once 04/22/23 2033 04/22/23 2045   04/23/23 2000  ceFEPIme (MAXIPIME) 2 g in sodium chloride 0.9 % 100 mL IVPB  Status:  Discontinued        2 g 200 mL/hr over 30 Minutes Intravenous Every 24 hours 04/22/23 2045 04/23/23 0912   04/23/23 1800  cefTRIAXone (ROCEPHIN) 2 g in sodium chloride 0.9 % 100 mL IVPB        2 g 200 mL/hr over 30 Minutes Intravenous Every 24 hours 04/23/23 0912 04/29/23 1701   04/22/23 2100  metroNIDAZOLE (FLAGYL) IVPB 500 mg  Status:  Discontinued        500 mg 100 mL/hr over 60 Minutes Intravenous Every 12 hours 04/22/23 2033 04/23/23 0912   04/22/23 2100  vancomycin (VANCOREADY) IVPB 750 mg/150 mL  Status:  Discontinued        750 mg 150 mL/hr over 60 Minutes Intravenous  Once 04/22/23 2045 04/23/23 0912   04/22/23 2046  vancomycin variable dose per unstable renal function (pharmacist dosing)  Status:  Discontinued         Does not apply See admin instructions 04/22/23 2046 04/23/23 0912   04/22/23 2045  vancomycin (VANCOCIN) IVPB 1000 mg/200 mL premix  Status:  Discontinued        1,000 mg 200 mL/hr over 60 Minutes Intravenous  Once 04/22/23 2033 04/22/23 2037   04/22/23 2043  vancomycin variable dose per unstable renal function (pharmacist dosing)  Status:  Discontinued         Does not apply See admin instructions 04/22/23 2045 04/22/23 2046   04/22/23 1745  ceFEPIme (MAXIPIME) 2 g in sodium chloride 0.9 % 100 mL IVPB        2 g 200 mL/hr over 30 Minutes Intravenous  Once 04/22/23 1742 04/22/23 1854   04/22/23 1745  vancomycin (VANCOCIN) IVPB 1000 mg/200 mL premix        1,000 mg 200 mL/hr over 60 Minutes Intravenous  Once 04/22/23 1742 04/22/23 1954              Family Communication/Anticipated D/C date and plan/Code Status   DVT prophylaxis: heparin injection 5,000 Units Start: 04/23/23 2200     Code Status: Limited: Do not attempt  resuscitation (DNR) -DNR-LIMITED -Do Not Intubate/DNI   Family Communication: None Disposition Plan: Plan to discharge to SNF   Status is: Inpatient Remains inpatient appropriate because: Awaiting placement to SNF       Subjective:   Interval events noted.  He has no complaints.  Objective:    Vitals:   05/01/23 1432 05/01/23 1943 05/02/23 0345 05/02/23 0908  BP: 112/68 117/68 (!) 129/92 122/76  Pulse: 89 90 86 79  Resp: 18 20 20 16   Temp: 98.4 F (36.9 C) 98.8 F (37.1 C) 97.7 F (36.5 C) 97.9 F (36.6 C)  TempSrc:      SpO2: 95% 96% 95% 94%  Weight:      Height:       No data found.   Intake/Output Summary (Last 24 hours) at 05/02/2023 1418 Last data filed at 05/02/2023 1100 Gross per 24 hour  Intake 120 ml  Output 600 ml  Net -480 ml   Filed Weights   04/22/23 1700  Weight: 77.1 kg    Exam:  GEN: NAD SKIN: Warm and dry EYES: No pallor or icterus ENT: MMM CV: RRR PULM: CTA B ABD: soft, ND, NT, +BS CNS: AAO x 2 (person and place) , non focal EXT: No edema or tenderness      Data Reviewed:   I have personally reviewed following labs and imaging studies:  Labs: Labs show the following:   Basic Metabolic Panel: Recent Labs  Lab 04/26/23 0557 04/27/23 0925 04/28/23 0419  NA 138 134* 137  K 3.8 3.6 3.7  CL 106 103 105  CO2 20* 21* 21*  GLUCOSE 90 90 102*  BUN 13 9 8   CREATININE 1.52* 1.36* 1.21  CALCIUM 7.9* 8.0* 8.2*   GFR Estimated Creatinine Clearance: 45.7 mL/min (by C-G formula based on SCr of 1.21 mg/dL). Liver Function Tests: No results for input(s): "AST", "ALT", "ALKPHOS", "BILITOT", "PROT", "ALBUMIN" in the last 168 hours.  No results for input(s): "LIPASE", "AMYLASE" in the last 168 hours. No results for input(s): "AMMONIA" in the last 168 hours. Coagulation profile No results for input(s): "INR", "PROTIME" in the last 168 hours.   CBC: Recent Labs  Lab 04/28/23 0419 05/01/23 0510  WBC 8.1 7.7  HGB 10.9*  10.6*  HCT 32.4* 31.6*  MCV 91.5 91.3  PLT 256 250   Cardiac Enzymes: No results for input(s): "CKTOTAL", "CKMB", "CKMBINDEX", "TROPONINI" in the last 168 hours. BNP (last 3 results) No results for input(s): "PROBNP" in the last 8760 hours. CBG: Recent Labs  Lab 05/01/23 1117 05/01/23 1645 05/01/23 2102 05/02/23 0847 05/02/23 1145  GLUCAP 154* 120* 164* 121* 137*   D-Dimer: No results for input(s): "DDIMER" in the last 72 hours. Hgb A1c: No results for input(s): "HGBA1C" in the last 72 hours. Lipid Profile: No results for input(s): "CHOL", "HDL", "LDLCALC", "TRIG", "CHOLHDL", "LDLDIRECT" in the last 72 hours. Thyroid function studies: No results for input(s): "TSH", "T4TOTAL", "T3FREE", "THYROIDAB" in the last 72 hours.  Invalid input(s): "FREET3" Anemia work up: No results for input(s): "VITAMINB12", "FOLATE", "FERRITIN", "TIBC", "IRON", "RETICCTPCT" in the last 72 hours. Sepsis Labs: Recent Labs  Lab 04/28/23 0419 05/01/23 0510  WBC 8.1 7.7    Microbiology Recent Results (from the past 240 hours)  Culture, blood (Routine x 2)     Status: Abnormal   Collection Time: 04/22/23  5:20 PM   Specimen: BLOOD  Result Value Ref Range Status   Specimen Description   Final    BLOOD RAC Performed at [facility], [address], [place], [place] [address]    Special Requests   Final    BOTTLES DRAWN AEROBIC AND ANAEROBIC Blood Culture results may not be optimal due to an inadequate volume of blood received in culture bottles Performed at [facility], [address] [place], [place] [address]    Culture  Setup Time   Final    ANAEROBIC BOTTLE ONLY  GRAM POSITIVE COCCI CRITICAL RESULT CALLED TO, READ BACK BY AND VERIFIED WITH: TREY GREENWOOD @0736  04/23/23 MJU Performed at [facility], [address] [place], [place] [address]    Culture STREPTOCOCCUS VESTIBULARIS (A)  Final   Report Status 04/25/2023 FINAL  Final   Organism ID, Bacteria  STREPTOCOCCUS VESTIBULARIS  Final      Susceptibility   Streptococcus vestibularis - MIC*    PENICILLIN <=0.06 SENSITIVE Sensitive     CEFTRIAXONE <=0.12 SENSITIVE Sensitive     ERYTHROMYCIN <=0.12 SENSITIVE Sensitive     LEVOFLOXACIN 2 SENSITIVE Sensitive     VANCOMYCIN 1 SENSITIVE Sensitive     * STREPTOCOCCUS VESTIBULARIS  Blood Culture ID Panel (Reflexed)     Status: Abnormal   Collection Time: 04/22/23  5:20 PM  Result Value Ref Range Status   Enterococcus faecalis NOT DETECTED NOT DETECTED Final   Enterococcus Faecium NOT DETECTED NOT DETECTED Final   Listeria monocytogenes NOT DETECTED NOT DETECTED Final   Staphylococcus species NOT DETECTED NOT DETECTED Final   Staphylococcus aureus (BCID) NOT DETECTED NOT DETECTED Final   Staphylococcus epidermidis NOT DETECTED NOT DETECTED Final   Staphylococcus lugdunensis NOT DETECTED NOT DETECTED Final   Streptococcus species DETECTED (A) NOT DETECTED Final    Comment: Not Enterococcus species, Streptococcus agalactiae, Streptococcus pyogenes, or Streptococcus pneumoniae. CRITICAL RESULT CALLED TO, READ BACK BY AND VERIFIED WITH: TREY GREENWOOD @0736  04/23/23 MJU    Streptococcus agalactiae NOT DETECTED NOT DETECTED Final   Streptococcus pneumoniae NOT DETECTED NOT DETECTED Final   Streptococcus pyogenes NOT DETECTED NOT DETECTED Final   A.calcoaceticus-baumannii NOT DETECTED NOT DETECTED Final   Bacteroides fragilis NOT DETECTED NOT DETECTED Final   Enterobacterales NOT DETECTED NOT DETECTED Final   Enterobacter cloacae complex NOT DETECTED NOT DETECTED Final   Escherichia coli NOT DETECTED NOT DETECTED Final   Klebsiella aerogenes NOT DETECTED NOT DETECTED Final   Klebsiella oxytoca NOT DETECTED NOT DETECTED Final   Klebsiella pneumoniae NOT DETECTED NOT DETECTED Final   Proteus species NOT DETECTED NOT DETECTED Final   Salmonella species NOT DETECTED NOT DETECTED Final   Serratia marcescens NOT DETECTED NOT DETECTED Final    Haemophilus influenzae NOT DETECTED NOT DETECTED Final   Neisseria meningitidis NOT DETECTED NOT DETECTED Final   Pseudomonas aeruginosa NOT DETECTED NOT DETECTED Final   Stenotrophomonas maltophilia NOT DETECTED NOT DETECTED Final   Candida albicans NOT DETECTED NOT DETECTED Final   Candida auris NOT DETECTED NOT DETECTED Final   Candida glabrata NOT DETECTED NOT DETECTED Final   Candida krusei NOT DETECTED NOT DETECTED Final   Candida parapsilosis NOT DETECTED NOT DETECTED Final   Candida tropicalis NOT DETECTED NOT DETECTED Final   Cryptococcus neoformans/gattii NOT DETECTED NOT DETECTED Final    Comment: Performed at [facility], [address] [place], [place] [address]  Culture, blood (Routine x 2)     Status: None   Collection Time: 04/22/23  6:37 PM   Specimen: BLOOD LEFT HAND  Result Value Ref Range Status   Specimen Description BLOOD LEFT HAND AEROBIC BOTTLE ONLY  Final   Special Requests   Final    AEROBIC BOTTLE ONLY Blood Culture results may not be optimal due to an inadequate volume of blood received in culture bottles   Culture   Final    NO GROWTH 5 DAYS Performed at [facility], [address] [place], [place] [address]    Report Status 04/27/2023 FINAL  Final  Urine Culture (for pregnant,  neutropenic or urologic patients or patients with an indwelling urinary catheter)     Status: Abnormal   Collection Time: 04/24/23 11:25 AM   Specimen: Urine, Clean Catch  Result Value Ref Range Status   Specimen Description   Final    URINE, CLEAN CATCH Performed at [facility], [address] [place], [place] [address]    Special Requests   Final    NONE Performed at [facility], [address] [place], [place] [address]    Culture 50,000 COLONIES/mL YEAST (A)  Final   Report Status 04/26/2023 FINAL  Final  Culture, blood (Routine X 2) w Reflex to ID Panel     Status: None   Collection Time: 04/26/23  5:57 AM   Specimen:  BLOOD  Result Value Ref Range Status   Specimen Description BLOOD BLOOD RIGHT HAND  Final   Special Requests   Final    BOTTLES DRAWN AEROBIC AND ANAEROBIC Blood Culture adequate volume   Culture   Final    NO GROWTH 5 DAYS Performed at [facility], [address], [place], [place] [address]    Report Status 05/01/2023 FINAL  Final  Culture, blood (Routine X 2) w Reflex to ID Panel     Status: None   Collection Time: 04/26/23  6:04 AM   Specimen: BLOOD  Result Value Ref Range Status   Specimen Description BLOOD BLOOD LEFT HAND  Final   Special Requests   Final    BOTTLES DRAWN AEROBIC AND ANAEROBIC Blood Culture adequate volume   Culture   Final    NO GROWTH 5 DAYS Performed at [facility], [address], [place], [place] [address]    Report Status 05/01/2023 FINAL  Final    Procedures and diagnostic studies:  No results found.              LOS: 9 days   Prescilla Monger  [employer] loss adjuster on [email or web address]. If 7PM-7AM, please contact night-coverage at [email or web address]     05/02/2023, 2:18 PM

## 2023-05-02 NOTE — Plan of Care (Signed)

## 2023-05-03 DIAGNOSIS — A419 Sepsis, unspecified organism: Secondary | ICD-10-CM | POA: Diagnosis not present

## 2023-05-03 LAB — GLUCOSE, CAPILLARY
Glucose-Capillary: 117 mg/dL — ABNORMAL HIGH (ref 70–99)
Glucose-Capillary: 174 mg/dL — ABNORMAL HIGH (ref 70–99)
Glucose-Capillary: 105 mg/dL — ABNORMAL HIGH (ref 70–99)

## 2023-05-03 NOTE — Progress Notes (Signed)
 Mobility Specialist - Progress Note   05/03/23 1101  Mobility  Activity Stood at bedside;Transferred from bed to chair  Level of Assistance Minimal assist, patient does 75% or more (+2)  Distance Ambulated (ft) 4 ft  Activity Response Tolerated well  Mobility visit 1 Mobility  Mobility Specialist Start Time (ACUTE ONLY) 1011  Mobility Specialist Stop Time (ACUTE ONLY) 1019  Mobility Specialist Time Calculation (min) (ACUTE ONLY) 8 min   Pt supine upon entry, utilizing RA. Pt agreeable to transfer to the recliner this date. Pt completed bed mob with HHA, dangled EOB for ~2 mins expressing some pain this date. Pt STS to RW MinA +2 and transferred to the recliner via SPT w/ CGA +2 for safety-- cueing to bring the RW closer to person when stepping towards the recliner. Pt left seated in the recliner with alarm set and needs within reach. RN and NT notified.  Versa Gore Mobility Specialist 05/03/23 11:07 AM

## 2023-05-03 NOTE — TOC Transition Note (Addendum)
 Transition of Care [facility]) - Discharge Note   Patient Details  Name: Derek Lynch MRN: [id number] Date of Birth: 11-03-1940  [facility]) CM/SW Contact:  Odilia Bennett, LCSW Phone Number: 05/03/2023, 3:06 PM   Clinical Narrative:   Patient has orders to discharge to [facility] SNF today. RN will call report to [phone] (Room 86A). [facility] has been arranged and he is 6th on the list. No further concerns. CSW signing off.  4:15 pm: Faxed PPD results to Dustin at [facility] in preparation for eventually moving into this facility.  Final next level of care: Skilled Nursing Facility Barriers to Discharge: Barriers Resolved   Patient Goals and CMS Choice            Discharge Placement   Existing PASRR number confirmed : 04/27/23          Patient chooses bed at: [facility] Patient to be transferred to facility by: [facility] Name of family member notified: Maxene Span Patient and family notified of of transfer: 05/03/23  Discharge Plan and Services Additional resources added to the After Visit Summary for                                       Social Drivers of Health (SDOH) Interventions SDOH Screenings   Food Insecurity: Patient Unable To Answer (04/24/2023)  Housing: Patient Unable To Answer (04/24/2023)  Transportation Needs: Patient Unable To Answer (04/24/2023)  Utilities: Not At Risk (04/24/2023)  Financial Resource Strain: Low Risk  (10/29/2022)   Received from [facility]  Social Connections: Unknown (04/24/2023)  Tobacco Use: Medium Risk (03/26/2023)     Readmission Risk Interventions     No data to display

## 2023-05-03 NOTE — TOC Progression Note (Addendum)
 Transition of Care [facility]) - Progression Note    Patient Details  Name: JEJUAN SCALA MRN: [id number] Date of Birth: [birth year]  Transition [facility]) CM/SW Contact  Odilia Bennett, LCSW Phone Number: 05/03/2023, 11:12 AM  Clinical Narrative:  CSW started SNF insurance authorization.   1:55 pm: Auth approved: [id number]. Valid 4/14-4/16. SNF confirmed they can take him today.  Expected Discharge Plan and Services                                               Social Determinants of Health (SDOH) Interventions SDOH Screenings   Food Insecurity: Patient Unable To Answer (04/24/2023)  Housing: Patient Unable To Answer (04/24/2023)  Transportation Needs: Patient Unable To Answer (04/24/2023)  Utilities: Not At Risk (04/24/2023)  Financial Resource Strain: Low Risk  (10/29/2022)   Received from [facility]  Social Connections: Unknown (04/24/2023)  Tobacco Use: Medium Risk (03/26/2023)    Readmission Risk Interventions     No data to display

## 2023-05-03 NOTE — Plan of Care (Signed)
 Adequate for discharge from acute care facility.

## 2023-05-03 NOTE — Discharge Summary (Addendum)
 Physician Discharge Summary   Patient: Derek Lynch MRN: [id number] DOB: 1940/12/06  Admit date:     04/22/2023  Discharge date: 05/03/23  Discharge Physician: Lurene Shadow   PCP: Cheryll Dessert, FNP   Recommendations at discharge:   Follow-up with physician at the nursing home within 3 days of discharge  Discharge Diagnoses: Principal Problem:   Sepsis due to undetermined organism [facility]) Active Problems:   AKI (acute kidney injury) (HCC)   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 (HCC)   Dyslipidemia   Essential hypertension   Bacteremia due to Streptococcus  Resolved Problems:   * No resolved hospital problems. [facility] Course:  Derek Lynch is a 83 y.o. male with medical history significant for osteoarthritis, chronic venous insufficiency, HFpEF, DM2, HTN, HLD. He presented to the emergency room with acute onset of generalized weakness. He was discharged from rehab about 3 days prior, but continued to get weaker w/ hard time getting out of his chair today.  He admits to urinary frequency and dysuria, mild dyspnea    Of note, recent admission 03/07-03/17 for influenza A w/ hypoxia initially requiring BiPAP, rhabdomyolysis, AKI. D/c to SNF.    04/03: to ED, hypotension, tachycardia, tachypnea, elevated WBC, elevated lactic, mild hyperkalemia 5.4, AKI w/ Cr 3.73. CXR question R pleural effusion. Started on IV cefepime, vancomycin, IV fluids. UA concern for UTI.  04/04: improved WBC, Cr, K, VS. BCx(+) strep, pending ID/susceptibilities, deescalate abx to rocephin only for now. Awaiting culture results.  04/05: UCx never sent so collected today. BCx pending ID. AKI improving.  04/06: AKI continues to improve. UCx pending BCx (+) strep vestibularis d/w ID, plan echo and repeat BCx tomorrow, cont IV abx   04/07: ID to see today. Echo pending. Repeat BCx obtained. UCx (+)yeast 04/08: per ID, no source for bacteremia, do not need TEE. Recs for abd [place] eval liver/ascites.     Assessment and Plan:  Sepsis due to undetermined organism  Severe sepsis (AKI)  Urinary Tract Infection UCX (+)yeast BCx(+) strep vestibularis Echo no vegetation He was started on IV ceftriaxone on 04/23/2023.  Completed 7 days of ceftriaxone on 04/29/2023. Initially treated with IV vancomycin, cefepime and metronidazole on 04/22/2023. He was given 1 dose of oral Diflucan on 04/27/2023 because of yeast in urine culture and known diabetic status. He was seen by ID specialist. TTE ok, per ID no TEE needed  No growth on repeat blood culture from 04/26/2023.     AKI (acute kidney injury) -improved Baseline GFR >60 no CKD likely prerenal due to volume depletion and dehydration. Associated with mild metabolic acidosis which has resolved. 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   continue Actos and metformin at discharge.   Essential hypertension Continue lisinopril and Lasix at discharge   Dyslipidemia continue statin     General Weakness s/p fall at home PT recommended discharge to SNF   Tuberculin skin test placed on 04/27/2023 was negative.  His condition has improved and he is deemed stable for discharge to SNF today.      Consultants: ID specialist Procedures performed: None Disposition: Skilled nursing facility Diet recommendation:  Discharge Diet Orders (From admission, onward)     Start     Ordered   05/03/23 0000  Diet Carb Modified        05/03/23  1402   05/03/23 0000  DIET DYS 3       Question:  Fluid consistency:  Answer:  Thin   05/03/23 1402           Dysphagia type 3 thin Liquid, diabetic diet DISCHARGE MEDICATION: Allergies as of 05/03/2023       Reactions   Penicillin G Other (See Comments)        Medication List     STOP taking these medications    nitrofurantoin  (macrocrystal-monohydrate) 100 MG capsule Commonly known as: MACROBID       TAKE these medications    acetaminophen 325 MG tablet Commonly known as: TYLENOL Take 650 mg by mouth every 6 (six) hours as needed for mild pain, moderate pain or headache.   aspirin EC 81 MG tablet Take 81 mg by mouth daily.   cholecalciferol 1000 units tablet Commonly known as: VITAMIN D Take 1,000 Units by mouth daily.   clobetasol cream 0.05 % Commonly known as: TEMOVATE Apply 1 Application topically 2 (two) times daily.   furosemide 20 MG tablet Commonly known as: LASIX Take 1 tablet (20 mg total) by mouth daily.   lisinopril 2.5 MG tablet Commonly known as: ZESTRIL Take 2.5 mg by mouth daily.   lovastatin 20 MG tablet Commonly known as: MEVACOR Take 20 mg by mouth daily at 6 PM.   metFORMIN 1000 MG tablet Commonly known as: GLUCOPHAGE Take 1 tablet by mouth 2 (two) times daily with a meal.   pioglitazone 15 MG tablet Commonly known as: ACTOS Take 15 mg by mouth daily.   vitamin C 1000 MG tablet Take 1,000 mg by mouth daily.               Discharge Care Instructions  (From admission, onward)           Start     Ordered   05/03/23 0000  Discharge wound care:       Comments: Avoid leaning on your buttocks for prolonged periods.  Apply Mepilex border to prevent worsening pressure injury   05/03/23 1402            Contact information for after-discharge care     Destination     [facility] .   Service: Skilled Nursing Contact information: [address] [place] [address] [phone]                    Discharge Exam: Ceasar Mons Weights   04/22/23 1700  Weight: 77.1 kg   GEN: NAD SKIN: Warm and dry EYES: No pallor or icterus ENT: MMM CV: RRR PULM: CTA B ABD: soft, ND, NT, +BS CNS: AAO x person and place, non focal EXT: No edema or tenderness  Condition at discharge: good  The results of significant  diagnostics from this hospitalization (including imaging, microbiology, ancillary and laboratory) are listed below for reference.   Imaging Studies: US Abdomen Limited RUQ (LIVER/GB) Result Date: 04/27/2023 CLINICAL DATA:  Hepatomegaly. EXAM: ULTRASOUND ABDOMEN LIMITED RIGHT UPPER QUADRANT COMPARISON:  CT 03/26/2023 FINDINGS: Gallbladder: Physiologically distended. No gallstones or wall thickening visualized. No sonographic Murphy sign noted by sonographer. Common bile duct: Diameter: 3 mm, normal. Liver: No focal lesion identified. Diffusely increased and coarsened in parenchymal echogenicity. No capsular nodularity. Portal vein is patent on color Doppler imaging with normal direction of blood flow towards the liver. The liver was not discretely measured, but was not enlarged on prior CT. Other: No right upper quadrant ascites.  IMPRESSION: 1. Hepatic steatosis. The liver was not discretely measured, but was not enlarged on CT last month. 2. Normal sonographic appearance of the gallbladder and biliary tree. Electronically Signed   By: Chadwick Colonel M.D.   On: 04/27/2023 21:19   ECHOCARDIOGRAM COMPLETE Result Date: 04/27/2023    ECHOCARDIOGRAM REPORT   Patient Name:   Derek Lynch Date of Exam: 04/26/2023 Medical Rec #:  [id number]     Height:       64.0 in Accession #:    [id number]    Weight:       170.0 lb Date of Birth:  11/24/1940    BSA:          1.826 m Patient Age:    82 years      BP:           129/77 mmHg Patient Gender: M             HR:           85 bpm. Exam Location:  [facility] Procedure: 2D Echo, Cardiac Doppler and Color Doppler (Both Spectral and Color            Flow Doppler were utilized during procedure). Indications:     Bacteremia R78.81  History:         Patient has prior history of Echocardiogram examinations, most                  recent 03/27/2023. Risk Factors:Dyslipidemia.  Sonographer:     Broadus Canes Referring Phys:  [id number] Melodi Sprung Diagnosing Phys: Sabina Custovic IMPRESSIONS   1. Left ventricular ejection fraction, by estimation, is 50 to 55%. The left ventricle has low normal function. The left ventricle has no regional wall motion abnormalities. Left ventricular diastolic parameters were normal.  2. Right ventricular systolic function is normal. The right ventricular size is normal.  3. The mitral valve is normal in structure. Trivial mitral valve regurgitation. No evidence of mitral stenosis.  4. The aortic valve is normal in structure. Aortic valve regurgitation is not visualized. No aortic stenosis is present.  5. The inferior vena cava is normal in size with greater than 50% respiratory variability, suggesting right atrial pressure of 3 mmHg. FINDINGS  Left Ventricle: Left ventricular ejection fraction, by estimation, is 50 to 55%. The left ventricle has low normal function. The left ventricle has no regional wall motion abnormalities. The left ventricular internal cavity size was normal in size. There is no left ventricular hypertrophy. Left ventricular diastolic parameters were normal. Right Ventricle: The right ventricular size is normal. No increase in right ventricular wall thickness. Right ventricular systolic function is normal. Left Atrium: Left atrial size was normal in size. Right Atrium: Right atrial size was normal in size. Pericardium: There is no evidence of pericardial effusion. Mitral Valve: The mitral valve is normal in structure. Trivial mitral valve regurgitation. No evidence of mitral valve stenosis. MV peak gradient, 4.0 mmHg. The mean mitral valve gradient is 2.0 mmHg. Tricuspid Valve: The tricuspid valve is normal in structure. Tricuspid valve regurgitation is trivial. Aortic Valve: The aortic valve is normal in structure. Aortic valve regurgitation is not visualized. No aortic stenosis is present. Aortic valve mean gradient measures 3.0 mmHg. Aortic valve peak gradient measures 5.0 mmHg. Aortic valve area, by VTI measures 2.52 cm. Pulmonic Valve: The  pulmonic valve was normal in structure. Pulmonic valve regurgitation is not visualized. Aorta: The aortic root is normal in size and structure. Venous: The inferior  vena cava is normal in size with greater than 50% respiratory variability, suggesting right atrial pressure of 3 mmHg. IAS/Shunts: No atrial level shunt detected by color flow Doppler.  LEFT VENTRICLE PLAX 2D LVIDd:         4.00 cm   Diastology LVIDs:         2.90 cm   LV e' medial:    6.96 cm/s LV PW:         0.90 cm   LV E/e' medial:  13.2 LV IVS:        1.30 cm   LV e' lateral:   12.10 cm/s LVOT diam:     2.10 cm   LV E/e' lateral: 7.6 LV SV:         56 LV SV Index:   31 LVOT Area:     3.46 cm  RIGHT VENTRICLE RV Basal diam:  4.30 cm RV Mid diam:    3.00 cm RV S prime:     13.30 cm/s TAPSE (M-mode): 1.7 cm LEFT ATRIUM           Index       RIGHT ATRIUM           Index LA diam:      2.20 cm 1.21 cm/m  RA Area:     15.60 cm LA Vol (A2C): 13.7 ml 7.50 ml/m  RA Volume:   42.10 ml  23.06 ml/m LA Vol (A4C): 18.2 ml 9.97 ml/m  AORTIC VALVE AV Area (Vmax):    2.39 cm AV Area (Vmean):   2.60 cm AV Area (VTI):     2.52 cm AV Vmax:           112.00 cm/s AV Vmean:          76.100 cm/s AV VTI:            0.221 m AV Peak Grad:      5.0 mmHg AV Mean Grad:      3.0 mmHg LVOT Vmax:         77.40 cm/s LVOT Vmean:        57.200 cm/s LVOT VTI:          0.161 m LVOT/AV VTI ratio: 0.73  AORTA Ao Root diam: 3.50 cm MITRAL VALVE                TRICUSPID VALVE MV Area (PHT): 4.21 cm     TR Peak grad:   20.4 mmHg MV Area VTI:   2.43 cm     TR Vmax:        226.00 cm/s MV Peak grad:  4.0 mmHg MV Mean grad:  2.0 mmHg     SHUNTS MV Vmax:       1.00 m/s     Systemic VTI:  0.16 m MV Vmean:      68.7 cm/s    Systemic Diam: 2.10 cm MV Decel Time: 180 msec MV E velocity: 91.70 cm/s MV A velocity: 102.00 cm/s MV E/A ratio:  0.90 Sabina Custovic Electronically signed by Isabell Manzanilla Signature Date/Time: 04/27/2023/10:37:49 AM    Final    DG Chest Port 1 View Result Date:  04/22/2023 CLINICAL DATA:  Sepsis.  Altered mental status. EXAM: PORTABLE CHEST 1 VIEW COMPARISON:  March 27, 2023. FINDINGS: Stable cardiomegaly. Left lung is clear. Minimal right basilar opacity is noted concerning for subsegmental atelectasis and possible small effusion. Bony thorax is unremarkable. IMPRESSION: Minimal right basilar opacity is noted concerning for subsegmental atelectasis and  possible small right pleural effusion. Electronically Signed   By: Lupita Raider M.D.   On: 04/22/2023 17:41   US Venous Img Lower Unilateral Right (DVT) Result Date: 04/03/2023 CLINICAL DATA:  Right lower extremity edema EXAM: RIGHT LOWER EXTREMITY VENOUS DOPPLER ULTRASOUND TECHNIQUE: Gray-scale sonography with compression, as well as color and duplex ultrasound, were performed to evaluate the deep venous system(s) from the level of the common femoral vein through the popliteal and proximal calf veins. COMPARISON:  06/08/2016 FINDINGS: VENOUS Normal compressibility of the common femoral, superficial femoral, and popliteal veins, as well as the visualized calf veins. Visualized portions of profunda femoral vein and great saphenous vein unremarkable. No filling defects to suggest DVT on grayscale or color Doppler imaging. Doppler waveforms show normal direction of venous flow, normal respiratory plasticity and response to augmentation. Limited views of the contralateral common femoral vein are unremarkable. OTHER None. Limitations: none IMPRESSION: 1. No evidence of deep venous thrombosis within the right lower extremity. Electronically Signed   By: Sharlet Salina M.D.   On: 04/03/2023 17:49    Microbiology: Results for orders placed or performed during the hospital encounter of 04/22/23  Culture, blood (Routine x 2)     Status: Abnormal   Collection Time: 04/22/23  5:20 PM   Specimen: BLOOD  Result Value Ref Range Status   Specimen Description   Final    BLOOD RAC Performed at [facility], [address], [place], [place] [address]    Special Requests   Final    BOTTLES DRAWN AEROBIC AND ANAEROBIC Blood Culture results may not be optimal due to an inadequate volume of blood received in culture bottles Performed at [facility], [address] [place], [place] [address]    Culture  Setup Time   Final    ANAEROBIC BOTTLE ONLY GRAM POSITIVE COCCI CRITICAL RESULT CALLED TO, READ BACK BY AND VERIFIED WITH: TREY GREENWOOD @0736  04/23/23 MJU Performed at [facility], [address] [place], [place] [address]    Culture STREPTOCOCCUS VESTIBULARIS (A)  Final   Report Status 04/25/2023 FINAL  Final   Organism ID, Bacteria STREPTOCOCCUS VESTIBULARIS  Final      Susceptibility   Streptococcus vestibularis - MIC*    PENICILLIN <=0.06 SENSITIVE Sensitive     CEFTRIAXONE <=0.12 SENSITIVE Sensitive     ERYTHROMYCIN <=0.12 SENSITIVE Sensitive     LEVOFLOXACIN 2 SENSITIVE Sensitive     VANCOMYCIN 1 SENSITIVE Sensitive     * STREPTOCOCCUS VESTIBULARIS  Blood Culture ID Panel (Reflexed)     Status: Abnormal   Collection Time: 04/22/23  5:20 PM  Result Value Ref Range Status   Enterococcus faecalis NOT DETECTED NOT DETECTED Final   Enterococcus Faecium NOT DETECTED NOT DETECTED Final   Listeria monocytogenes NOT DETECTED NOT DETECTED Final   Staphylococcus species NOT DETECTED NOT DETECTED Final   Staphylococcus aureus (BCID) NOT DETECTED NOT DETECTED Final   Staphylococcus epidermidis NOT DETECTED NOT DETECTED Final   Staphylococcus lugdunensis NOT DETECTED NOT DETECTED Final   Streptococcus species DETECTED (A) NOT DETECTED Final    Comment: Not Enterococcus species, Streptococcus agalactiae, Streptococcus pyogenes, or Streptococcus pneumoniae. CRITICAL RESULT CALLED TO, READ BACK BY AND VERIFIED WITH: TREY GREENWOOD @0736  04/23/23 MJU    Streptococcus agalactiae NOT DETECTED NOT DETECTED Final   Streptococcus pneumoniae NOT DETECTED NOT DETECTED Final   Streptococcus  pyogenes NOT DETECTED NOT DETECTED Final   A.calcoaceticus-baumannii NOT DETECTED NOT DETECTED Final   Bacteroides fragilis  NOT DETECTED NOT DETECTED Final   Enterobacterales NOT DETECTED NOT DETECTED Final   Enterobacter cloacae complex NOT DETECTED NOT DETECTED Final   Escherichia coli NOT DETECTED NOT DETECTED Final   Klebsiella aerogenes NOT DETECTED NOT DETECTED Final   Klebsiella oxytoca NOT DETECTED NOT DETECTED Final   Klebsiella pneumoniae NOT DETECTED NOT DETECTED Final   Proteus species NOT DETECTED NOT DETECTED Final   Salmonella species NOT DETECTED NOT DETECTED Final   Serratia marcescens NOT DETECTED NOT DETECTED Final   Haemophilus influenzae NOT DETECTED NOT DETECTED Final   Neisseria meningitidis NOT DETECTED NOT DETECTED Final   Pseudomonas aeruginosa NOT DETECTED NOT DETECTED Final   Stenotrophomonas maltophilia NOT DETECTED NOT DETECTED Final   Candida albicans NOT DETECTED NOT DETECTED Final   Candida auris NOT DETECTED NOT DETECTED Final   Candida glabrata NOT DETECTED NOT DETECTED Final   Candida krusei NOT DETECTED NOT DETECTED Final   Candida parapsilosis NOT DETECTED NOT DETECTED Final   Candida tropicalis NOT DETECTED NOT DETECTED Final   Cryptococcus neoformans/gattii NOT DETECTED NOT DETECTED Final    Comment: Performed at [facility], [address] [place], [place] [address]  Culture, blood (Routine x 2)     Status: None   Collection Time: 04/22/23  6:37 PM   Specimen: BLOOD LEFT HAND  Result Value Ref Range Status   Specimen Description BLOOD LEFT HAND AEROBIC BOTTLE ONLY  Final   Special Requests   Final    AEROBIC BOTTLE ONLY Blood Culture results may not be optimal due to an inadequate volume of blood received in culture bottles   Culture   Final    NO GROWTH 5 DAYS Performed at [facility], [address] [place], [place] [address]    Report Status 04/27/2023 FINAL  Final  Urine Culture (for pregnant, neutropenic or  urologic patients or patients with an indwelling urinary catheter)     Status: Abnormal   Collection Time: 04/24/23 11:25 AM   Specimen: Urine, Clean Catch  Result Value Ref Range Status   Specimen Description   Final    URINE, CLEAN CATCH Performed at [facility], [address] [place], [place] [address]    Special Requests   Final    NONE Performed at [facility], [address] [place], [place] [address]    Culture 50,000 COLONIES/mL YEAST (A)  Final   Report Status 04/26/2023 FINAL  Final  Culture, blood (Routine X 2) w Reflex to ID Panel     Status: None   Collection Time: 04/26/23  5:57 AM   Specimen: BLOOD  Result Value Ref Range Status   Specimen Description BLOOD BLOOD RIGHT HAND  Final   Special Requests   Final    BOTTLES DRAWN AEROBIC AND ANAEROBIC Blood Culture adequate volume   Culture   Final    NO GROWTH 5 DAYS Performed at [facility], [address] [place], [place] [address]    Report Status 05/01/2023 FINAL  Final  Culture, blood (Routine X 2) w Reflex to ID Panel     Status: None   Collection Time: 04/26/23  6:04 AM   Specimen: BLOOD  Result Value Ref Range Status   Specimen Description BLOOD BLOOD LEFT HAND  Final   Special Requests   Final    BOTTLES DRAWN AEROBIC AND ANAEROBIC Blood Culture adequate volume   Culture   Final    NO GROWTH 5 DAYS Performed at [facility], [address] [place], [place] [address]    Report Status 05/01/2023 FINAL  Final    Labs: CBC: Recent Labs  Lab 04/28/23 0419 05/01/23 0510  WBC 8.1 7.7  HGB 10.9* 10.6*  HCT 32.4* 31.6*  MCV 91.5 91.3  PLT 256 250   Basic Metabolic Panel: Recent Labs  Lab 04/27/23 0925 04/28/23 0419 05/02/23 1453  NA 134* 137 138  K 3.6 3.7 4.1  CL 103 105 104  CO2 21* 21* 25  GLUCOSE 90 102* 118*  BUN 9 8 7*  CREATININE 1.36* 1.21 1.20  CALCIUM 8.0* 8.2* 9.0   Liver Function Tests: No results for input(s): "AST", "ALT",  "ALKPHOS", "BILITOT", "PROT", "ALBUMIN" in the last 168 hours. CBG: Recent Labs  Lab 05/02/23 1145 05/02/23 1748 05/02/23 2110 05/03/23 0720 05/03/23 1123  GLUCAP 137* 148* 127* 117* 174*    Discharge time spent: greater than 30 minutes.  Signed: Sheril Dines, MD [facility] 05/03/2023

## 2023-07-20 DEATH — deceased
